# Patient Record
Sex: Female | Born: 1995 | Race: Black or African American | Hispanic: No | Marital: Single | Smoking: Former smoker
Health system: Southern US, Community
[De-identification: ages and names within clinical notes are randomized; demographics above are authoritative.]

## PROBLEM LIST (undated history)

## (undated) DIAGNOSIS — N979 Female infertility, unspecified: Secondary | ICD-10-CM

## (undated) DIAGNOSIS — F32A Depression, unspecified: Secondary | ICD-10-CM

## (undated) DIAGNOSIS — D649 Anemia, unspecified: Secondary | ICD-10-CM

## (undated) DIAGNOSIS — R0602 Shortness of breath: Secondary | ICD-10-CM

## (undated) DIAGNOSIS — R7303 Prediabetes: Secondary | ICD-10-CM

## (undated) DIAGNOSIS — R5383 Other fatigue: Secondary | ICD-10-CM

## (undated) DIAGNOSIS — F419 Anxiety disorder, unspecified: Secondary | ICD-10-CM

## (undated) DIAGNOSIS — K5900 Constipation, unspecified: Secondary | ICD-10-CM

## (undated) DIAGNOSIS — Z8249 Family history of ischemic heart disease and other diseases of the circulatory system: Secondary | ICD-10-CM

## (undated) DIAGNOSIS — F329 Major depressive disorder, single episode, unspecified: Secondary | ICD-10-CM

## (undated) DIAGNOSIS — E282 Polycystic ovarian syndrome: Secondary | ICD-10-CM

## (undated) DIAGNOSIS — Z91018 Allergy to other foods: Secondary | ICD-10-CM

## (undated) DIAGNOSIS — J45909 Unspecified asthma, uncomplicated: Secondary | ICD-10-CM

## (undated) DIAGNOSIS — E559 Vitamin D deficiency, unspecified: Secondary | ICD-10-CM

## (undated) DIAGNOSIS — M7989 Other specified soft tissue disorders: Secondary | ICD-10-CM

## (undated) DIAGNOSIS — R011 Cardiac murmur, unspecified: Secondary | ICD-10-CM

## (undated) HISTORY — DX: Morbid (severe) obesity due to excess calories: E66.01

## (undated) HISTORY — DX: Prediabetes: R73.03

## (undated) HISTORY — DX: Family history of ischemic heart disease and other diseases of the circulatory system: Z82.49

## (undated) HISTORY — DX: Cardiac murmur, unspecified: R01.1

## (undated) HISTORY — DX: Unspecified asthma, uncomplicated: J45.909

## (undated) HISTORY — DX: Anxiety disorder, unspecified: F41.9

## (undated) HISTORY — DX: Other fatigue: R53.83

## (undated) HISTORY — DX: Shortness of breath: R06.02

## (undated) HISTORY — PX: NO PAST SURGERIES: SHX2092

## (undated) HISTORY — DX: Vitamin D deficiency, unspecified: E55.9

## (undated) HISTORY — DX: Constipation, unspecified: K59.00

## (undated) HISTORY — DX: Allergy to other foods: Z91.018

## (undated) HISTORY — DX: Female infertility, unspecified: N97.9

## (undated) HISTORY — DX: Other specified soft tissue disorders: M79.89

## (undated) HISTORY — DX: Major depressive disorder, single episode, unspecified: F32.9

## (undated) HISTORY — DX: Depression, unspecified: F32.A

---
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
## 2015-01-14 ENCOUNTER — Encounter: Payer: 59 | Admitting: Obstetrics and Gynecology

## 2015-01-19 ENCOUNTER — Encounter: Payer: 59 | Admitting: Obstetrics and Gynecology

## 2015-01-24 ENCOUNTER — Ambulatory Visit (INDEPENDENT_AMBULATORY_CARE_PROVIDER_SITE_OTHER): Payer: 59 | Admitting: Obstetrics and Gynecology

## 2015-01-24 VITALS — BP 118/82 | HR 70 | Ht 62.0 in | Wt 277.1 lb

## 2015-01-24 DIAGNOSIS — Z3042 Encounter for surveillance of injectable contraceptive: Secondary | ICD-10-CM

## 2015-01-24 MED ORDER — MEDROXYPROGESTERONE ACETATE 150 MG/ML IM SUSP
150.0000 mg | Freq: Once | INTRAMUSCULAR | Status: AC
  Administered 2015-01-24: 150 mg via INTRAMUSCULAR

## 2015-01-24 NOTE — Progress Notes (Cosign Needed)
Patient ID: Katherine Huynh, female   DOB: 02-15-1996, 19 y.o.   MRN: [id number] Pt presents for Depo-Provera injection. Continues to gain weight with weight gain of 20 lbs since last visit in June. No c/o side effects except weight gain. Total of 40lbs since March. Sending message to front desk that an annual should have been scheduled this visit and to please schedule one for her next visit.

## 2015-03-03 ENCOUNTER — Encounter: Payer: 59 | Admitting: Obstetrics and Gynecology

## 2015-04-11 ENCOUNTER — Ambulatory Visit: Payer: 59

## 2015-04-12 ENCOUNTER — Encounter: Payer: Self-pay | Admitting: Obstetrics and Gynecology

## 2015-04-12 ENCOUNTER — Ambulatory Visit (INDEPENDENT_AMBULATORY_CARE_PROVIDER_SITE_OTHER): Payer: 59 | Admitting: Obstetrics and Gynecology

## 2015-04-12 VITALS — BP 134/89 | HR 89 | Ht 63.0 in | Wt 295.0 lb

## 2015-04-12 DIAGNOSIS — E669 Obesity, unspecified: Secondary | ICD-10-CM | POA: Diagnosis not present

## 2015-04-12 DIAGNOSIS — N926 Irregular menstruation, unspecified: Secondary | ICD-10-CM | POA: Diagnosis not present

## 2015-04-12 DIAGNOSIS — Z01419 Encounter for gynecological examination (general) (routine) without abnormal findings: Secondary | ICD-10-CM | POA: Diagnosis not present

## 2015-04-12 DIAGNOSIS — Z23 Encounter for immunization: Secondary | ICD-10-CM

## 2015-04-12 LAB — POCT URINE PREGNANCY: Preg Test, Ur: NEGATIVE

## 2015-04-12 MED ORDER — INFLUENZA VAC SPLIT QUAD 0.5 ML IM SUSY
0.5000 mL | PREFILLED_SYRINGE | Freq: Once | INTRAMUSCULAR | Status: AC
  Administered 2015-04-12: 0.5 mL via INTRAMUSCULAR

## 2015-04-12 MED ORDER — DROSPIREN-ETH ESTRAD-LEVOMEFOL 3-0.02-0.451 MG PO TABS: 1.0000 | ORAL_TABLET | Freq: Every day | ORAL | Status: DC

## 2015-04-12 NOTE — Progress Notes (Signed)
  Subjective:     Hoyle BarrJalisa Malkin is a 19 y.o. female and is here for a comprehensive physical exam. The patient reports no problems except weight gain that she feels like is from depo.  Social History   Social History  . Marital Status: Single    Spouse Name: N/A  . Number of Children: N/A  . Years of Education: N/A   Occupational History  . Not on file.   Social History Main Topics  . Smoking status: Never Smoker   . Smokeless tobacco: Never Used  . Alcohol Use: Yes     Comment: occas  . Drug Use: No  . Sexual Activity: Yes    Birth Control/ Protection: None   Other Topics Concern  . Not on file   Social History Narrative   Health Maintenance  Topic Date Due  . CHLAMYDIA SCREENING  06/22/2010  . HIV Screening  06/22/2010  . TETANUS/TDAP  06/22/2014  . INFLUENZA VACCINE  12/13/2014    The following portions of the patient's history were reviewed and updated as appropriate: allergies, current medications, past family history, past medical history, past social history, past surgical history and problem list.  Review of Systems Pertinent items noted in HPI and remainder of comprehensive ROS otherwise negative.   Objective:    General appearance: alert, cooperative, appears stated age and morbidly obese Neck: no adenopathy, no carotid bruit, no JVD, supple, symmetrical, trachea midline and thyroid not enlarged, symmetric, no tenderness/mass/nodules Lungs: clear to auscultation bilaterally Breasts: normal appearance, no masses or tenderness Heart: regular rate and rhythm, S1, S2 normal, no murmur, click, rub or gallop Abdomen: soft, non-tender; bowel sounds normal; no masses,  no organomegaly Pelvic: cervix normal in appearance, external genitalia normal, no adnexal masses or tenderness, no cervical motion tenderness, rectovaginal septum normal, uterus normal size, shape, and consistency and vagina normal without discharge    Assessment:    Healthy female exam. Morbid  obesity; Switching HC due to weight gain; Flu Vaccine given      Plan:  Pap not indicated, switch to [facility] OCP- to start this Sunday. Weight loss with exercise and diet modifications encourage, will consider Nutritional counseling.   See After Visit Summary for Counseling Recommendations

## 2015-04-12 NOTE — Patient Instructions (Addendum)
Place annual gynecologic exam patient instructions here.  Thank you for enrolling in MyChart. Please follow the instructions below to securely access your online medical record. MyChart allows you to send messages to your doctor, view your test results, manage appointments, and more.   How Do I Sign Up? 1. In your Internet browser, go to [employer] and enter [email or web address]. 2. Click on the Sign Up Now link in the Sign In box. You will see the New Member Sign Up page. 3. Enter your MyChart Access Code exactly as it appears below. You will not need to use this code after you've completed the sign-up process. If you do not sign up before the expiration date, you must request a new code.  MyChart Access Code: [email or web address] Expires: 05/31/2015  8:52 AM  4. Enter your Social Security Number ([email or web address]) and Date of Birth (mm/dd/yyyy) as indicated and click Submit. You will be taken to the next sign-up page. 5. Create a MyChart ID. This will be your MyChart login ID and cannot be changed, so think of one that is secure and easy to remember. 6. Create a MyChart password. You can change your password at any time. 7. Enter your Password Reset Question and Answer. This can be used at a later time if you forget your password.  8. Enter your e-mail address. You will receive e-mail notification when new information is available in MyChart. 9. Click Sign Up. You can now view your medical record.   Additional Information Remember, MyChart is NOT to be used for urgent needs. For medical emergencies, dial 911.   Calorie Counting for Weight Loss Calories are energy you get from the things you eat and drink. Your body uses this energy to keep you going throughout the day. The number of calories you eat affects your weight. When you eat more calories than your body needs, your body stores the extra calories as fat. When you eat fewer calories than your body needs, your body burns fat to  get the energy it needs. Calorie counting means keeping track of how many calories you eat and drink each day. If you make sure to eat fewer calories than your body needs, you should lose weight. In order for calorie counting to work, you will need to eat the number of calories that are right for you in a day to lose a healthy amount of weight per week. A healthy amount of weight to lose per week is usually 1-2 lb (0.5-0.9 kg). A dietitian can determine how many calories you need in a day and give you suggestions on how to reach your calorie goal.  WHAT IS MY MY PLAN? My goal is to have __________ calories per day.  If I have this many calories per day, I should lose around __________ pounds per week. WHAT DO I NEED TO KNOW ABOUT CALORIE COUNTING? In order to meet your daily calorie goal, you will need to:  Find out how many calories are in each food you would like to eat. Try to do this before you eat.  Decide how much of the food you can eat.  Write down what you ate and how many calories it had. Doing this is called keeping a food log. WHERE DO I FIND CALORIE INFORMATION? The number of calories in a food can be found on a Nutrition Facts label. Note that all the information on a label is based on a specific serving of the food. If a food does not have  a Nutrition Facts label, try to look up the calories online or ask your dietitian for help. HOW DO I DECIDE HOW MUCH TO EAT? To decide how much of the food you can eat, you will need to consider both the number of calories in one serving and the size of one serving. This information can be found on the Nutrition Facts label. If a food does not have a Nutrition Facts label, look up the information online or ask your dietitian for help. Remember that calories are listed per serving. If you choose to have more than one serving of a food, you will have to multiply the calories per serving by the amount of servings you plan to eat. For example, the label  on a package of bread might say that a serving size is 1 slice and that there are 90 calories in a serving. If you eat 1 slice, you will have eaten 90 calories. If you eat 2 slices, you will have eaten 180 calories. HOW DO I KEEP A FOOD LOG? After each meal, record the following information in your food log:  What you ate.  How much of it you ate.  How many calories it had.  Then, add up your calories. Keep your food log near you, such as in a small notebook in your pocket. Another option is to use a mobile app or website. Some programs will calculate calories for you and show you how many calories you have left each time you add an item to the log. WHAT ARE SOME CALORIE COUNTING TIPS?  Use your calories on foods and drinks that will fill you up and not leave you hungry. Some examples of this include foods like nuts and nut butters, vegetables, lean proteins, and high-fiber foods (more than 5 g fiber per serving).  Eat nutritious foods and avoid empty calories. Empty calories are calories you get from foods or beverages that do not have many nutrients, such as candy and soda. It is better to have a nutritious high-calorie food (such as an avocado) than a food with few nutrients (such as a bag of chips).  Know how many calories are in the foods you eat most often. This way, you do not have to look up how many calories they have each time you eat them.  Look out for foods that may seem like low-calorie foods but are really high-calorie foods, such as baked goods, soda, and fat-free candy.  Pay attention to calories in drinks. Drinks such as sodas, specialty coffee drinks, alcohol, and juices have a lot of calories yet do not fill you up. Choose low-calorie drinks like water and diet drinks.  Focus your calorie counting efforts on higher calorie items. Logging the calories in a garden salad that contains only vegetables is less important than calculating the calories in a milk shake.  Find a  way of tracking calories that works for you. Get creative. Most people who are successful find ways to keep track of how much they eat in a day, even if they do not count every calorie. WHAT ARE SOME PORTION CONTROL TIPS?  Know how many calories are in a serving. This will help you know how many servings of a certain food you can have.  Use a measuring cup to measure serving sizes. This is helpful when you start out. With time, you will be able to estimate serving sizes for some foods.  Take some time to put servings of different foods on your  favorite plates, bowls, and cups so you know what a serving looks like.  Try not to eat straight from a bag or box. Doing this can lead to overeating. Put the amount you would like to eat in a cup or on a plate to make sure you are eating the right portion.  Use smaller plates, glasses, and bowls to prevent overeating. This is a quick and easy way to practice portion control. If your plate is smaller, less food can fit on it.  Try not to multitask while eating, such as watching TV or using your computer. If it is time to eat, sit down at a table and enjoy your food. Doing this will help you to start recognizing when you are full. It will also make you more aware of what and how much you are eating. HOW CAN I CALORIE COUNT WHEN EATING OUT?  Ask for smaller portion sizes or child-sized portions.  Consider sharing an entree and sides instead of getting your own entree.  If you get your own entree, eat only half. Ask for a box at the beginning of your meal and put the rest of your entree in it so you are not tempted to eat it.  Look for the calories on the menu. If calories are listed, choose the lower calorie options.  Choose dishes that include vegetables, fruits, whole grains, low-fat dairy products, and lean protein. Focusing on smart food choices from each of the 5 food groups can help you stay on track at restaurants.  Choose items that are boiled,  broiled, grilled, or steamed.  Choose water, milk, unsweetened iced tea, or other drinks without added sugars. If you want an alcoholic beverage, choose a lower calorie option. For example, a regular margarita can have up to 700 calories and a glass of wine has around 150.  Stay away from items that are buttered, battered, fried, or served with cream sauce. Items labeled "crispy" are usually fried, unless stated otherwise.  Ask for dressings, sauces, and syrups on the side. These are usually very high in calories, so do not eat much of them.  Watch out for salads. Many people think salads are a healthy option, but this is often not the case. Many salads come with bacon, fried chicken, lots of cheese, fried chips, and dressing. All of these items have a lot of calories. If you want a salad, choose a garden salad and ask for grilled meats or steak. Ask for the dressing on the side, or ask for olive oil and vinegar or lemon to use as dressing.  Estimate how many servings of a food you are given. For example, a serving of cooked rice is  cup or about the size of half a tennis ball or one cupcake wrapper. Knowing serving sizes will help you be aware of how much food you are eating at restaurants. The list below tells you how big or small some common portion sizes are based on everyday objects.  1 oz--4 stacked dice.  3 oz--1 deck of cards.  1 tsp--1 dice.  1 Tbsp-- a Ping-Pong ball.  2 Tbsp--1 Ping-Pong ball.   cup--1 tennis ball or 1 cupcake wrapper.  1 cup--1 baseball.   This information is not intended to replace advice given to you by your health care provider. Make sure you discuss any questions you have with your health care provider.   Document Released: 04/30/2005 Document Revised: 05/21/2014 Document Reviewed: 03/05/2013 Elsevier Interactive Patient Education [employer]

## 2015-04-12 NOTE — Addendum Note (Signed)
Addended by: Purcell NailsSHAMBLEY, Jaydee Conran N on: 04/12/2015 03:16 PM   Modules accepted: Orders

## 2015-04-16 LAB — GC/CHLAMYDIA PROBE AMP
Chlamydia trachomatis, NAA: NEGATIVE
Neisseria gonorrhoeae by PCR: NEGATIVE

## 2015-05-28 LAB — HEMOGLOBIN A1C
Est. average glucose Bld gHb Est-mCnc: 111 mg/dL
Hgb A1c MFr Bld: 5.5 % (ref 4.8–5.6)

## 2015-05-28 LAB — COMPREHENSIVE METABOLIC PANEL
ALT: 16 IU/L (ref 0–32)
AST: 14 IU/L (ref 0–40)
Albumin/Globulin Ratio: 1.3 (ref 1.1–2.5)
Albumin: 4 g/dL (ref 3.5–5.5)
Alkaline Phosphatase: 76 IU/L (ref 39–117)
BUN/Creatinine Ratio: 11 (ref 8–20)
BUN: 9 mg/dL (ref 6–20)
Bilirubin Total: <0.2 mg/dL (ref 0.0–1.2)
CO2: 22 mmol/L (ref 18–29)
Calcium: 9.4 mg/dL (ref 8.7–10.2)
Chloride: 102 mmol/L (ref 96–106)
Creatinine, Ser: 0.81 mg/dL (ref 0.57–1.00)
GFR calc Af Amer: 122 mL/min/{1.73_m2} (ref >59)
GFR calc non Af Amer: 106 mL/min/{1.73_m2} (ref >59)
Globulin, Total: 3 g/dL (ref 1.5–4.5)
Glucose: 98 mg/dL (ref 65–99)
Potassium: 4.6 mmol/L (ref 3.5–5.2)
Sodium: 138 mmol/L (ref 134–144)
Total Protein: 7 g/dL (ref 6.0–8.5)

## 2015-05-28 LAB — LIPASE: Lipase: 28 U/L (ref 0–59)

## 2015-06-02 ENCOUNTER — Ambulatory Visit (INDEPENDENT_AMBULATORY_CARE_PROVIDER_SITE_OTHER): Payer: 59 | Admitting: Obstetrics and Gynecology

## 2015-06-02 ENCOUNTER — Encounter: Payer: Self-pay | Admitting: Obstetrics and Gynecology

## 2015-06-02 MED ORDER — CYANOCOBALAMIN 1000 MCG/ML IJ SOLN: 1000.0000 ug | Freq: Once | INTRAMUSCULAR | Status: DC

## 2015-06-02 MED ORDER — PHENTERMINE HCL 37.5 MG PO TABS: 37.5000 mg | ORAL_TABLET | Freq: Every day | ORAL | Status: DC

## 2015-06-02 NOTE — Progress Notes (Signed)
Subjective:  Katherine Huynh is a 20 y.o. No obstetric history on file. at Unknown being seen today for weight loss management- initial visit.  Patient reports General ROS: negative and reports previous weight loss attempts to be unsuccessful since starting Depo (is off of it now)  Onset was gradual over several months.  Onset followed:  starting medication, The following portions of the patient's history were reviewed and updated as appropriate: allergies, current medications, past family history, past medical history, past social history, past surgical history and problem list.   Objective:   Filed Vitals:   06/02/15 1106  BP: 130/76  Pulse: 110  Height:  (1.6 m)  Weight: 295 lb 6.4 oz (133.993 kg)    General:  Alert, oriented and cooperative. Patient is in no acute distress.  :   :   :   :   :   :   PE: Well groomed female in no current distress,   Mental Status: Normal mood and affect. Normal behavior. Normal judgment and thought content.   Current BMI: Body mass index is 52.34 kg/(m^2).   Assessment and Plan:  Obesity  There are no diagnoses linked to this encounter.  Plan: low carb, High protein diet RX for adipex 37.5 mg daily and B12 .ml monthly, to start now with first injection given at today's visit. Reviewed side-effects common to both medications and expected outcomes. Increase daily water intake to at least 8 bottle a day, every day.  Goal is to reduse weight by 10% by end of three months, and will re-evaluate then.  RTC in 4 weeks for Nurse visit to check weight & BP, and get next B12 injections.    Please refer to After Visit Summary for other counseling recommendations.    Melody [place], CNM   Melody [employer], CNM      Consider the Low Glycemic Index Diet and 6 smaller meals daily .  This boosts your metabolism and regulates your sugars:   Use the protein bar by Atkins because they have lots of fiber in them  Find the low  carb flatbreads, tortillas and pita breads for sandwiches:  Joseph's makes a pita bread and a flat bread , available at [facility] and [employer]; Toufayah makes a low carb flatbread available at [employer] and HT that is 9 net carbs and 100 cal [place] makes a low carb whole wheat tortilla available at [employer] most grocery stores with 6 net carbs and 210 cal  [place] yogurt can still have a lot of carbs .  Dannon Light N fit has 80 cal and 8 carbs

## 2015-06-09 ENCOUNTER — Telehealth: Payer: Self-pay | Admitting: Obstetrics and Gynecology

## 2015-06-09 NOTE — Telephone Encounter (Signed)
Patient called stating she is having side affects from phentermine. She stated that she is trembling. Please Advise

## 2015-06-10 NOTE — Telephone Encounter (Signed)
Called pt she states she is trembling advised pt to take 1/2 of tablet and see how she does with that, she voiced understanding

## 2015-06-14 ENCOUNTER — Ambulatory Visit (INDEPENDENT_AMBULATORY_CARE_PROVIDER_SITE_OTHER): Payer: 59 | Admitting: Obstetrics and Gynecology

## 2015-06-14 ENCOUNTER — Encounter: Payer: Self-pay | Admitting: Obstetrics and Gynecology

## 2015-06-14 VITALS — BP 122/78 | HR 99 | Ht 63.0 in | Wt 290.8 lb

## 2015-06-14 DIAGNOSIS — N921 Excessive and frequent menstruation with irregular cycle: Secondary | ICD-10-CM | POA: Diagnosis not present

## 2015-06-14 NOTE — Progress Notes (Signed)
Subjective:     Patient ID: Katherine Huynh, female   DOB: Aug 01, 1995, 20 y.o.   MRN: [id number]  HPI Reports onset of menses at end of last pack of pills, it has been heavy x 3 days with changing pads every 1-2 hours, better this am. Cramping in lower abdomen and aching in to legs. Denies any BTB.  Review of Systems See above.    Objective:   Physical Exam A&O x4 Well groomed morbidly obese female in no distress Thyroid not enlarged Pelvic not indicated.    Assessment:     Menorrhagia on OCPs, new switch x 1 month     Plan:     Continue Beyaz x 2 more months, will consider change if bleeding persist. OK to use Aleeve prn for cramps. OK to continue weight loss meds. RTC at scheduled appointment.   Shariq [place], CNM

## 2015-06-30 ENCOUNTER — Ambulatory Visit (INDEPENDENT_AMBULATORY_CARE_PROVIDER_SITE_OTHER): Payer: 59 | Admitting: Obstetrics and Gynecology

## 2015-06-30 VITALS — BP 113/85 | HR 92 | Wt 286.6 lb

## 2015-06-30 DIAGNOSIS — E669 Obesity, unspecified: Secondary | ICD-10-CM | POA: Diagnosis not present

## 2015-06-30 MED ORDER — CYANOCOBALAMIN 1000 MCG/ML IJ SOLN
1000.0000 ug | Freq: Once | INTRAMUSCULAR | Status: AC
  Administered 2015-06-30: 1000 ug via INTRAMUSCULAR

## 2015-06-30 NOTE — Progress Notes (Cosign Needed)
Patient ID: Katherine Huynh, female   DOB: 12/12/1995, 20 y.o.   MRN: [id number] Pt presents for weight, B/P, B-12 injection. No side effects of medication-Phentermine, or B-12.  Weight loss of 3 lbs. Encouraged eating healthy and exercise.

## 2015-07-28 ENCOUNTER — Ambulatory Visit (INDEPENDENT_AMBULATORY_CARE_PROVIDER_SITE_OTHER): Payer: 59 | Admitting: Obstetrics and Gynecology

## 2015-07-28 VITALS — BP 117/81 | HR 90 | Wt 290.1 lb

## 2015-07-28 DIAGNOSIS — E669 Obesity, unspecified: Secondary | ICD-10-CM | POA: Diagnosis not present

## 2015-07-28 MED ORDER — CYANOCOBALAMIN 1000 MCG/ML IJ SOLN
1000.0000 ug | Freq: Once | INTRAMUSCULAR | Status: AC
  Administered 2015-07-28: 1000 ug via INTRAMUSCULAR

## 2015-07-28 NOTE — Progress Notes (Signed)
Patient ID: Katherine Huynh, female   DOB: 11/03/1995, 20 y.o.   MRN: [id number] Pt presents for weight, B/P, B-12 injection. No side effects of medication-Phentermine, or B-12.  Weight gain of 4 lbs. Encouraged eating healthy and exercise. Pt affect seems depressed. Talked with pt but nothing really said except that she wasn't eating. This could be her problem with weight loss. The pills do decrease her appetite. I advised pt to eat. She needs to change her lifestyle. To eat healthy meals and healthy snacks and to watch portion sizes. Also include exercise daily.

## 2015-08-25 ENCOUNTER — Ambulatory Visit: Payer: 59

## 2015-09-15 ENCOUNTER — Ambulatory Visit (INDEPENDENT_AMBULATORY_CARE_PROVIDER_SITE_OTHER): Payer: 59 | Admitting: Family Medicine

## 2015-09-15 ENCOUNTER — Encounter: Payer: Self-pay | Admitting: Family Medicine

## 2015-09-15 VITALS — BP 124/82 | HR 99 | Temp 98.5°F | Resp 16 | Ht 63.0 in | Wt 290.0 lb

## 2015-09-15 DIAGNOSIS — B351 Tinea unguium: Secondary | ICD-10-CM

## 2015-09-15 DIAGNOSIS — Z114 Encounter for screening for human immunodeficiency virus [HIV]: Secondary | ICD-10-CM | POA: Diagnosis not present

## 2015-09-15 DIAGNOSIS — Z8249 Family history of ischemic heart disease and other diseases of the circulatory system: Secondary | ICD-10-CM

## 2015-09-15 DIAGNOSIS — Z0000 Encounter for general adult medical examination without abnormal findings: Secondary | ICD-10-CM | POA: Diagnosis not present

## 2015-09-15 DIAGNOSIS — Z882 Allergy status to sulfonamides status: Secondary | ICD-10-CM | POA: Diagnosis not present

## 2015-09-15 DIAGNOSIS — B353 Tinea pedis: Secondary | ICD-10-CM

## 2015-09-15 HISTORY — DX: Family history of ischemic heart disease and other diseases of the circulatory system: Z82.49

## 2015-09-15 MED ORDER — CICLOPIROX 8 % EX SOLN: Freq: Every day | CUTANEOUS | Status: DC

## 2015-09-15 MED ORDER — TERBINAFINE HCL 1 % EX CREA: 1.0000 "application " | TOPICAL_CREAM | Freq: Every day | CUTANEOUS | Status: DC

## 2015-09-15 MED ORDER — LORATADINE 10 MG PO TABS: 10.0000 mg | ORAL_TABLET | Freq: Every day | ORAL | Status: DC | PRN

## 2015-09-15 NOTE — Progress Notes (Signed)
BP 124/82 mmHg  Pulse 99  Temp(Src) 98.5 F (36.9 C) (Oral)  Resp 16  Ht 5\' 3"  (1.6 m)  Wt 290 lb (131.543 kg)  BMI 51.38 kg/m2  SpO2 98%  LMP 07/16/2015   Subjective:    Patient ID: Katherine Huynh, female    DOB: 01/20/1996, 20 y.o.   MRN: [id number]  HPI: Katherine BarrJalisa Venuto is a 20 y.o. female  Chief Complaint  Patient presents with  . Nail Problem    onset years has tried otc med with no relief   Patient is here as a new patient to me; has thickening of the toenails on the left foot only Right foot toenails are fine Wants to do something about the nail thickening Took zinc; no help  Depression screen PHQ 2/9 09/15/2015  Decreased Interest 0  Down, Depressed, Hopeless 0  PHQ - 2 Score 0   Relevant past medical, surgical, family and social history reviewed Past Medical History  Diagnosis Date  . Depression   . Fam hx-ischem heart disease 09/15/2015   Family History  Problem Relation Age of Onset  . Cancer Maternal Grandmother     breast  . Heart disease Maternal Grandfather   . Heart disease Paternal Grandfather   MD note: father takes cholesterol medicine  Social History  Substance Use Topics  . Smoking status: Never Smoker   . Smokeless tobacco: Never Used  . Alcohol Use: Yes     Comment: occas   Interim medical history since last visit reviewed. Allergies and medications reviewed  Review of Systems Per HPI unless specifically indicated above     Objective:    BP 124/82 mmHg  Pulse 99  Temp(Src) 98.5 F (36.9 C) (Oral)  Resp 16  Ht 5\' 3"  (1.6 m)  Wt 290 lb (131.543 kg)  BMI 51.38 kg/m2  SpO2 98%  LMP 07/16/2015  Wt Readings from Last 3 Encounters:  09/15/15 290 lb (131.543 kg)  07/28/15 290 lb 2 oz (131.6 kg)  06/30/15 286 lb 9 oz (129.984 kg)    Physical Exam  Constitutional: She appears well-developed and well-nourished.  Morbidly obese, no distress  HENT:  Mouth/Throat: Mucous membranes are normal.  Eyes: EOM are normal. No scleral  icterus.  Cardiovascular: Normal rate and regular rhythm.   Pulmonary/Chest: Effort normal and breath sounds normal.  Skin: Rash (scale and mild desquamation of skin left foot along sole consistent with athlete's foot infection) noted.  Toenails on the left foot thickened with yellowish discoloration; left foot toenails are normal  Psychiatric: She has a normal mood and affect. Her behavior is normal.      Assessment & Plan:   Problem List Items Addressed This Visit      Musculoskeletal and Integument   Athlete's foot, left    Topical; treatment of both nails and skin needed      Relevant Medications   ciclopirox (PENLAC) 8 % solution   terbinafine (LAMISIL AT) 1 % cream   Onychomycosis - Primary    Start penlac; treat the athlete's foot on the left as well; use separate clippers for toenails left vs right foot      Relevant Medications   ciclopirox (PENLAC) 8 % solution   terbinafine (LAMISIL AT) 1 % cream     Other   Fam hx-ischem heart disease   Personal history of sulfonamide allergy    Other Visit Diagnoses    Preventative health care        used for screening lab  Relevant Orders    Lipid Panel w/o Chol/HDL Ratio    Encounter for screening for HIV        one-time HIV testing recommended    Relevant Orders    HIV antibody       Follow up plan: No Follow-up on file.  An after-visit summary was printed and given to the patient at check-out.  Please see the patient instructions which may contain other information and recommendations beyond what is mentioned above in the assessment and plan.  Meds ordered this encounter  Medications  . loratadine (CLARITIN) 10 MG tablet    Sig: Take 1 tablet (10 mg total) by mouth daily as needed for allergies or itching.    Dispense:  30 tablet    Refill:  11  . ciclopirox (PENLAC) 8 % solution    Sig: Apply topically at bedtime. Apply over nail and surrounding skin. Apply daily over previous coat. After seven (7) days, may  remove with alcohol and continue cycle.    Dispense:  6.6 mL    Refill:  0  . terbinafine (LAMISIL AT) 1 % cream    Sig: Apply 1 application topically daily. To the left foot and in between the toes    Dispense:  30 g    Refill:  0    Orders Placed This Encounter  Procedures  . HIV antibody  . Lipid Panel w/o Chol/HDL Ratio

## 2015-09-15 NOTE — Patient Instructions (Signed)
Use the penlac on the nails Use the lamisil on the left foot Have labs today If you have not heard anything from my staff in a week about any orders/referrals/studies from today, please contact us here to follow-up [phone]

## 2015-10-10 DIAGNOSIS — B353 Tinea pedis: Secondary | ICD-10-CM | POA: Insufficient documentation

## 2015-10-10 NOTE — Assessment & Plan Note (Signed)
Topical; treatment of both nails and skin needed

## 2015-10-10 NOTE — Assessment & Plan Note (Signed)
Start penlac; treat the athlete's foot on the left as well; use separate clippers for toenails left vs right foot

## 2015-11-29 ENCOUNTER — Encounter: Payer: Self-pay | Admitting: Obstetrics and Gynecology

## 2015-11-29 ENCOUNTER — Ambulatory Visit (INDEPENDENT_AMBULATORY_CARE_PROVIDER_SITE_OTHER): Payer: 59 | Admitting: Obstetrics and Gynecology

## 2015-11-29 VITALS — BP 133/91 | HR 88 | Ht 63.0 in | Wt 294.9 lb

## 2015-11-29 DIAGNOSIS — Z3169 Encounter for other general counseling and advice on procreation: Secondary | ICD-10-CM | POA: Diagnosis not present

## 2015-11-29 DIAGNOSIS — R21 Rash and other nonspecific skin eruption: Secondary | ICD-10-CM | POA: Diagnosis not present

## 2015-11-29 MED ORDER — PHENTERMINE HCL 37.5 MG PO TABS: 37.5000 mg | ORAL_TABLET | Freq: Every day | ORAL | Status: DC

## 2015-11-29 MED ORDER — CYANOCOBALAMIN 1000 MCG/ML IJ SOLN: 1000.0000 ug | Freq: Once | INTRAMUSCULAR | Status: DC

## 2015-11-29 NOTE — Progress Notes (Signed)
Subjective:     Patient ID: Katherine Huynh, female   DOB: 07/09/1995, 20 y.o.   MRN: [id number]  HPI Patient desires pregnancy in spring of next year, has questions related to vitamins, anxiety and weight in leu of getting pregnant. Had a SAB at age 20, was told it was due to stress, thinks she was [redacted] weeks along. FOB family history of brother born deaf, otherwise negative. Reports regular menses at this time.  Review of Systems Rash after taking any kinds of multivitamins, and with allergy to cinnamon and sulfa meds.     Objective:   Physical Exam A&O x4  well groomed obese female in no distress, boyfriend here and very supportive Blood pressure 133/91, pulse 88, height 5\' 3"  (1.6 m), weight 294 lb 14.4 oz (133.766 kg), last menstrual period 10/27/2015. PE deferred     Assessment:     Pre-conception counseling Morbid obesity Elevated BP Rash- allergic in nature-unknown etiology     Plan:     >50% of visit spent in counseling. Discussed stress management and recommended counseling- declined at this time, will consider in the future. Discussed allergies to vitamins and recommended allergy testing - referral placed. Discussed elevated BMI and it's effects on achieving pregnancy and increasing risks in pregnancy. Strongly encouraged to lose weight and maintain weight loss before trying. Desires to restart weight loss meds-B12 injection given today-will return in 4 weeks for nurse visit-wt/bp/B12.  Katherine Huynh, CNM

## 2015-12-27 ENCOUNTER — Ambulatory Visit: Payer: 59

## 2016-02-17 ENCOUNTER — Ambulatory Visit (INDEPENDENT_AMBULATORY_CARE_PROVIDER_SITE_OTHER): Payer: 59 | Admitting: Family Medicine

## 2016-02-17 ENCOUNTER — Encounter: Payer: Self-pay | Admitting: Family Medicine

## 2016-02-17 VITALS — BP 130/80 | HR 94 | Temp 99.1°F | Resp 16 | Ht 63.0 in | Wt 293.2 lb

## 2016-02-17 DIAGNOSIS — R5383 Other fatigue: Secondary | ICD-10-CM

## 2016-02-17 DIAGNOSIS — Z23 Encounter for immunization: Secondary | ICD-10-CM

## 2016-02-17 DIAGNOSIS — R454 Irritability and anger: Secondary | ICD-10-CM

## 2016-02-17 DIAGNOSIS — N920 Excessive and frequent menstruation with regular cycle: Secondary | ICD-10-CM | POA: Insufficient documentation

## 2016-02-17 DIAGNOSIS — N921 Excessive and frequent menstruation with irregular cycle: Secondary | ICD-10-CM | POA: Diagnosis not present

## 2016-02-17 DIAGNOSIS — R635 Abnormal weight gain: Secondary | ICD-10-CM | POA: Diagnosis not present

## 2016-02-17 DIAGNOSIS — F5081 Binge eating disorder: Secondary | ICD-10-CM

## 2016-02-17 DIAGNOSIS — F339 Major depressive disorder, recurrent, unspecified: Secondary | ICD-10-CM | POA: Insufficient documentation

## 2016-02-17 DIAGNOSIS — F331 Major depressive disorder, recurrent, moderate: Secondary | ICD-10-CM

## 2016-02-17 DIAGNOSIS — F50819 Binge eating disorder, unspecified: Secondary | ICD-10-CM

## 2016-02-17 HISTORY — DX: Morbid (severe) obesity due to excess calories: E66.01

## 2016-02-17 LAB — T4, FREE: Free T4: 1.1 ng/dL (ref 0.8–1.4)

## 2016-02-17 LAB — LIPID PANEL
Cholesterol: 146 mg/dL (ref 125–170)
HDL: 56 mg/dL (ref >=46)
LDL Cholesterol: 71 mg/dL (ref <110)
Total CHOL/HDL Ratio: 2.6 Ratio (ref <=5.0)
Triglycerides: 97 mg/dL (ref <150)
VLDL: 19 mg/dL (ref <30)

## 2016-02-17 LAB — CBC WITH DIFFERENTIAL/PLATELET
Basophils Absolute: 0 cells/uL (ref 0–200)
Basophils Relative: 0 %
Eosinophils Absolute: 252 cells/uL (ref 15–500)
Eosinophils Relative: 3 %
HCT: 38.3 % (ref 35.0–45.0)
Hemoglobin: 12.7 g/dL (ref 11.7–15.5)
Lymphocytes Relative: 28 %
Lymphs Abs: 2352 cells/uL (ref 850–3900)
MCH: 27.3 pg (ref 27.0–33.0)
MCHC: 33.2 g/dL (ref 32.0–36.0)
MCV: 82.4 fL (ref 80.0–100.0)
MPV: 9.3 fL (ref 7.5–12.5)
Monocytes Absolute: 504 cells/uL (ref 200–950)
Monocytes Relative: 6 %
Neutro Abs: 5292 cells/uL (ref 1500–7800)
Neutrophils Relative %: 63 %
Platelets: 320 10*3/uL (ref 140–400)
RBC: 4.65 MIL/uL (ref 3.80–5.10)
RDW: 14.4 % (ref 11.0–15.0)
WBC: 8.4 10*3/uL (ref 3.8–10.8)

## 2016-02-17 LAB — COMPLETE METABOLIC PANEL WITH GFR
ALT: 9 U/L (ref 6–29)
AST: 11 U/L (ref 10–30)
Albumin: 3.9 g/dL (ref 3.6–5.1)
Alkaline Phosphatase: 70 U/L (ref 33–115)
BUN: 11 mg/dL (ref 7–25)
CO2: 25 mmol/L (ref 20–31)
Calcium: 9.5 mg/dL (ref 8.6–10.2)
Chloride: 104 mmol/L (ref 98–110)
Creat: 0.91 mg/dL (ref 0.50–1.10)
GFR, Est African American: >89 mL/min (ref >=60)
GFR, Est Non African American: >89 mL/min (ref >=60)
Glucose, Bld: 84 mg/dL (ref 65–99)
Potassium: 4.4 mmol/L (ref 3.5–5.3)
Sodium: 138 mmol/L (ref 135–146)
Total Bilirubin: 0.2 mg/dL (ref 0.2–1.2)
Total Protein: 7.6 g/dL (ref 6.1–8.1)

## 2016-02-17 LAB — VITAMIN B12: Vitamin B-12: 472 pg/mL (ref 200–1100)

## 2016-02-17 LAB — FERRITIN: Ferritin: 43 ng/mL (ref 10–154)

## 2016-02-17 LAB — TSH: TSH: 3.53 mIU/L

## 2016-02-17 MED ORDER — FLUOXETINE HCL 20 MG PO TABS: 20.0000 mg | ORAL_TABLET | Freq: Every day | ORAL | 0 refills | Status: DC

## 2016-02-17 NOTE — Assessment & Plan Note (Signed)
Refer to psych 

## 2016-02-17 NOTE — Patient Instructions (Signed)
We'll get labs today We'll refer you to the psychiatrist Continue to work with Ival BibleGary Bailey If you have not heard anything from my staff in a week about any orders/referrals/studies from today, please contact us here to follow-up [phone] If you have any thoughts of self-harm or hurting others, please call the crisis line or 911 Start the antidepressant

## 2016-02-17 NOTE — Assessment & Plan Note (Addendum)
Refer to psychiatry; continue to work with Ival BibleGary Bailey; patient has crisis number; agrees to call for help if SI/HI; start fluoxetine; check labs; symptoms don't sound to be typical bipolar disorder, but may reflect poor sleep pattern, ADHD, intermittent explosive disorder, personality disorder, etc; I want patient to see psychiatrist soon, and continue to work with therapist obviously; if any worsening of symptoms on the SSRI, patient to call, close f/u

## 2016-02-17 NOTE — Progress Notes (Signed)
BP 130/80 (BP Location: Left Arm, Patient Position: Sitting, Cuff Size: Normal)   Pulse 94   Temp 99.1 F (37.3 C) (Oral)   Resp 16   Ht 5\' 3"  (1.6 m)   Wt 293 lb 4 oz (133 kg)   SpO2 97%   BMI 51.95 kg/m    Subjective:    Patient ID: Katherine Huynh, female    DOB: 11-Jun-1995, 20 y.o.   MRN: [id number]  HPI: Katherine Huynh is a 20 y.o. female  Chief Complaint  Patient presents with  . Medication Refill    Depression    Patient is here to discuss going back on medicine She wants to start back on depression medicine No hx of hospitalizations for depression She tried to hurt herself when she was 20 years old (hanging), started seeing therapist but was no hospitalized That's when she started medicine; they started fluoxetine She just stopped taking it when she was 20 years old; she does feel like it helped from 50 to 66 I asked for more info about the anger episodes; sometimes she gets so irritated by little things and she loses it; she will yell and throw things; she does not hurt people; she will break things and feels out of control Not much trouble with impusive action, but can't control Does binge eat "all the time", started about two years; gained 100 pounds since then; that does not run in the family; 15 days a month binge eats No hx of abuse in the past Feels anxious all the time Some days she does not sleep and gets really angry, she will stay up for days She just started seeing Ival Bible, therapist, yeasterday She does not know if she has any family hx of depression; no real knowledge of family hx of mental illness She hears "noises"; she gets really paranoid when she is at home, hears noises and thinks she hears someone cough downstairs; going on since high school She denies visual hallucinations No thoughts that any one is out to get her She does not think she has any special powers, like reading minds or causing events to happen Periods are not really regular;  sometimes a few days or weeks late; has some hormone changes No recent thoughts of hurting herself or anyone She used to take fluoxetine, 20 mg at first, 40 mg most recently Lives with mother, father, fiance Very low energy; constipation; no hair loss; definitely dry skin; strong fam hx of thyroid disease  Depression screen [facility] 2/9 02/17/2016 09/15/2015  Decreased Interest 3 0  Down, Depressed, Hopeless 3 0  PHQ - 2 Score 6 0  Altered sleeping 3 -  Tired, decreased energy 3 -  Change in appetite 3 -  Feeling bad or failure about yourself  3 -  Trouble concentrating 3 -  Moving slowly or fidgety/restless 0 -  Suicidal thoughts 0 -  PHQ-9 Score 21 -  Difficult doing work/chores Not difficult at all -   Relevant past medical, surgical, family and social history reviewed Past Medical History:  Diagnosis Date  . Depression   . Fam hx-ischem heart disease 09/15/2015   Past Surgical History:  Procedure Laterality Date  . NO PAST SURGERIES     Family History  Problem Relation Age of Onset  . Cancer Maternal Grandmother     breast  . Heart disease Maternal Grandfather   . Heart disease Paternal Grandfather   mother and maternal grandmother both with thyroid disease  Social History  Substance Use Topics  . Smoking status: Never Smoker  . Smokeless tobacco: Never Used  . Alcohol use Yes     Comment: occas   Interim medical history since last visit reviewed. Allergies and medications reviewed  Review of Systems Per HPI unless specifically indicated above     Objective:    BP 130/80 (BP Location: Left Arm, Patient Position: Sitting, Cuff Size: Normal)   Pulse 94   Temp 99.1 F (37.3 C) (Oral)   Resp 16   Ht 5\' 3"  (1.6 m)   Wt 293 lb 4 oz (133 kg)   SpO2 97%   BMI 51.95 kg/m   Wt Readings from Last 3 Encounters:  02/17/16 293 lb 4 oz (133 kg)  11/29/15 294 lb 14.4 oz (133.8 kg)  09/15/15 290 lb (131.5 kg)    Physical Exam  Constitutional: She appears well-developed  and well-nourished. No distress.  Eyes: EOM are normal. No scleral icterus.  Neck: No thyromegaly present.  Cardiovascular: Normal rate.   Pulmonary/Chest: Effort normal.  Abdominal: She exhibits no distension.  Skin: No pallor.  Psychiatric: She has a normal mood and affect. Her behavior is normal. Judgment and thought content normal. Her mood appears not anxious. Her affect is not angry, not blunt, not labile and not inappropriate. Her speech is not rapid and/or pressured. She is not agitated, not hyperactive, not slowed and not actively hallucinating. Thought content is not paranoid and not delusional. Cognition and memory are not impaired. She does not express impulsivity or inappropriate judgment. She does not exhibit a depressed mood. She expresses no homicidal and no suicidal ideation. She is attentive.      Assessment & Plan:   Problem List Items Addressed This Visit      Other   Morbid obesity (HCC)    Sounds to be related to her binge eating, she may be a candidate for Vyvanse, but will have her psych first      Relevant Orders   Lipid panel (Completed)   COMPLETE METABOLIC PANEL WITH GFR (Completed)   Heavy period    Check labs      Relevant Orders   CBC with Differential/Platelet (Completed)   Ferritin (Completed)   Fatigue    Check labs      Relevant Orders   Vitamin B12 (Completed)   VITAMIN D 25 Hydroxy (Vit-D Deficiency, Fractures)   Difficulty controlling anger    Refer to psych      Relevant Orders   Ambulatory referral to Psychiatry   Depression, major, recurrent (HCC) - Primary    Refer to psychiatry; continue to work with Ival Bible; patient has crisis number; agrees to call for help if SI/HI; start fluoxetine; check labs; symptoms don't sound to be typical bipolar disorder, but may reflect poor sleep pattern, ADHD, intermittent explosive disorder, personality disorder, etc; I want patient to see psychiatrist soon, and continue to work with therapist  obviously; if any worsening of symptoms on the SSRI, patient to call, close f/u      Relevant Medications   FLUoxetine (PROZAC) 20 MG tablet   Other Relevant Orders   Ambulatory referral to Psychiatry   Binge eating disorder    Refer to psych      Relevant Orders   Ambulatory referral to Psychiatry   Abnormal weight gain   Relevant Orders   TSH (Completed)   T4, free (Completed)    Other Visit Diagnoses    Needs flu shot  Relevant Orders   Flu Vaccine QUAD 36+ mos PF IM (Fluarix & Fluzone Quad PF) (Completed)      Follow up plan: Return in about 2 weeks (around 03/02/2016).  An after-visit summary was printed and given to the patient at check-out.  Please see the patient instructions which may contain other information and recommendations beyond what is mentioned above in the assessment and plan.  Meds ordered this encounter  Medications  . FLUoxetine (PROZAC) 20 MG tablet    Sig: Take 1 tablet (20 mg total) by mouth daily. For two weeks, then two tablets daily    Dispense:  46 tablet    Refill:  0    Orders Placed This Encounter  Procedures  . Flu Vaccine QUAD 36+ mos PF IM (Fluarix & Fluzone Quad PF)  . CBC with Differential/Platelet  . Lipid panel  . Ferritin  . TSH  . T4, free  . Vitamin B12  . VITAMIN D 25 Hydroxy (Vit-D Deficiency, Fractures)  . COMPLETE METABOLIC PANEL WITH GFR  . Ambulatory referral to Psychiatry

## 2016-02-17 NOTE — Assessment & Plan Note (Signed)
Check labs 

## 2016-02-22 NOTE — Assessment & Plan Note (Signed)
Sounds to be related to her binge eating, she may be a candidate for Vyvanse, but will have her psych first

## 2016-02-28 ENCOUNTER — Telehealth: Payer: Self-pay | Admitting: Family Medicine

## 2016-02-28 NOTE — Telephone Encounter (Signed)
Reviewed labs, patient wants to take leave from school and wants to know if you will write letter?

## 2016-02-28 NOTE — Telephone Encounter (Signed)
Absolutely, I'll be glad to write her a letter I called patient Left msg, okay for letter; I'd love to talk with her and see how she's doing since her visit Please call

## 2016-02-28 NOTE — Telephone Encounter (Signed)
Patient is requesting return call for test results. Also, she is requesting a letter for her school (taking medical leave) stating her condition. Patient is willing to pick it up as soon as possible.

## 2016-03-06 ENCOUNTER — Ambulatory Visit (INDEPENDENT_AMBULATORY_CARE_PROVIDER_SITE_OTHER): Payer: 59 | Admitting: Family Medicine

## 2016-03-06 ENCOUNTER — Encounter: Payer: Self-pay | Admitting: Family Medicine

## 2016-03-06 DIAGNOSIS — F5081 Binge eating disorder: Secondary | ICD-10-CM | POA: Diagnosis not present

## 2016-03-06 DIAGNOSIS — F331 Major depressive disorder, recurrent, moderate: Secondary | ICD-10-CM | POA: Diagnosis not present

## 2016-03-06 DIAGNOSIS — R030 Elevated blood-pressure reading, without diagnosis of hypertension: Secondary | ICD-10-CM | POA: Diagnosis not present

## 2016-03-06 DIAGNOSIS — R300 Dysuria: Secondary | ICD-10-CM | POA: Insufficient documentation

## 2016-03-06 LAB — POCT URINALYSIS DIPSTICK
Glucose, UA: NEGATIVE
Ketones, UA: NEGATIVE
Nitrite, UA: NEGATIVE
Spec Grav, UA: >=1.03
Urobilinogen, UA: NEGATIVE
pH, UA: 6

## 2016-03-06 MED ORDER — NITROFURANTOIN MONOHYD MACRO 100 MG PO CAPS: 100.0000 mg | ORAL_CAPSULE | Freq: Two times a day (BID) | ORAL | 0 refills | Status: AC

## 2016-03-06 MED ORDER — FLUOXETINE HCL 40 MG PO CAPS: 40.0000 mg | ORAL_CAPSULE | Freq: Every day | ORAL | 2 refills | Status: DC

## 2016-03-06 NOTE — Patient Instructions (Addendum)
Your goal blood pressure is less than 140 mmHg on top. Try to follow the DASH guidelines (DASH stands for Dietary Approaches to Stop Hypertension) Try to limit the sodium in your diet.  Ideally, consume less than 1.5 grams (less than 1,500mg ) per day. Do not add salt when cooking or at the table.  Check the sodium amount on labels when shopping, and choose items lower in sodium when given a choice. Avoid or limit foods that already contain a lot of sodium. Eat a diet rich in fruits and vegetables and whole grains.  Check your blood pressure about once a week and let us know if not to goal (less than 140/90); ideal blood pressure is less than 120/80 Please do follow-up with the psychiatrist  Start the antibiotic Please do eat yogurt daily or take a probiotic daily for the next month or two We want to replace the healthy germs in the gut If you notice foul, watery diarrhea in the next two months, schedule an appointment RIGHT AWAY    DASH Eating Plan DASH stands for "Dietary Approaches to Stop Hypertension." The DASH eating plan is a healthy eating plan that has been shown to reduce high blood pressure (hypertension). Additional health benefits may include reducing the risk of type 2 diabetes mellitus, heart disease, and stroke. The DASH eating plan may also help with weight loss. WHAT DO I NEED TO KNOW ABOUT THE DASH EATING PLAN? For the DASH eating plan, you will follow these general guidelines:  Choose foods with a percent daily value for sodium of less than 5% (as listed on the food label).  Use salt-free seasonings or herbs instead of table salt or sea salt.  Check with your health care provider or pharmacist before using salt substitutes.  Eat lower-sodium products, often labeled as "lower sodium" or "no salt added."  Eat fresh foods.  Eat more vegetables, fruits, and low-fat dairy products.  Choose whole grains. Look for the word "whole" as the first word in the ingredient  list.  Choose fish and skinless chicken or [place] more often than red meat. Limit fish, poultry, and meat to 6 oz (170 g) each day.  Limit sweets, desserts, sugars, and sugary drinks.  Choose heart-healthy fats.  Limit cheese to 1 oz (28 g) per day.  Eat more home-cooked food and less restaurant, buffet, and fast food.  Limit fried foods.  Cook foods using methods other than frying.  Limit canned vegetables. If you do use them, rinse them well to decrease the sodium.  When eating at a restaurant, ask that your food be prepared with less salt, or no salt if possible. WHAT FOODS CAN I EAT? Seek help from a dietitian for individual calorie needs. Grains Whole grain or whole wheat bread. [place] rice. Whole grain or whole wheat pasta. Quinoa, bulgur, and whole grain cereals. Low-sodium cereals. Corn or whole wheat flour tortillas. Whole grain cornbread. Whole grain crackers. Low-sodium crackers. Vegetables Fresh or frozen vegetables (raw, steamed, roasted, or grilled). Low-sodium or reduced-sodium tomato and vegetable juices. Low-sodium or reduced-sodium tomato sauce and paste. Low-sodium or reduced-sodium canned vegetables.  Fruits All fresh, canned (in natural juice), or frozen fruits. Meat and Other Protein Products Ground beef (85% or leaner), grass-fed beef, or beef trimmed of fat. Skinless chicken or [place]. Ground chicken or [place]. Pork trimmed of fat. All fish and seafood. Eggs. Dried beans, peas, or lentils. Unsalted nuts and seeds. Unsalted canned beans. Dairy Low-fat dairy products, such as skim or 1%  milk, 2% or reduced-fat cheeses, low-fat ricotta or cottage cheese, or plain low-fat yogurt. Low-sodium or reduced-sodium cheeses. Fats and Oils Tub margarines without trans fats. Light or reduced-fat mayonnaise and salad dressings (reduced sodium). Avocado. Safflower, olive, or canola oils. Natural peanut or almond butter. Other Unsalted popcorn and pretzels. The items listed  above may not be a complete list of recommended foods or beverages. Contact your dietitian for more options. WHAT FOODS ARE NOT RECOMMENDED? Grains White bread. White pasta. White rice. Refined cornbread. Bagels and croissants. Crackers that contain trans fat. Vegetables Creamed or fried vegetables. Vegetables in a cheese sauce. Regular canned vegetables. Regular canned tomato sauce and paste. Regular tomato and vegetable juices. Fruits Dried fruits. Canned fruit in light or heavy syrup. Fruit juice. Meat and Other Protein Products Fatty cuts of meat. Ribs, chicken wings, bacon, sausage, bologna, salami, chitterlings, fatback, hot dogs, bratwurst, and packaged luncheon meats. Salted nuts and seeds. Canned beans with salt. Dairy Whole or 2% milk, cream, half-and-half, and cream cheese. Whole-fat or sweetened yogurt. Full-fat cheeses or blue cheese. Nondairy creamers and whipped toppings. Processed cheese, cheese spreads, or cheese curds. Condiments Onion and garlic salt, seasoned salt, table salt, and sea salt. Canned and packaged gravies. Worcestershire sauce. Tartar sauce. Barbecue sauce. Teriyaki sauce. Soy sauce, including reduced sodium. Steak sauce. Fish sauce. Oyster sauce. Cocktail sauce. Horseradish. Ketchup and mustard. Meat flavorings and tenderizers. Bouillon cubes. Hot sauce. Tabasco sauce. Marinades. Taco seasonings. Relishes. Fats and Oils Butter, stick margarine, lard, shortening, ghee, and bacon fat. Coconut, palm kernel, or palm oils. Regular salad dressings. Other Pickles and olives. Salted popcorn and pretzels. The items listed above may not be a complete list of foods and beverages to avoid. Contact your dietitian for more information. WHERE CAN I FIND MORE INFORMATION? National Heart, Lung, and Blood Institute: [employer]   This information is not intended to replace advice given to you by your health care provider. Make sure you  discuss any questions you have with your health care provider.   Document Released: 04/19/2011 Document Revised: 05/21/2014 Document Reviewed: 03/04/2013 Elsevier Interactive Patient Education [employer]

## 2016-03-06 NOTE — Telephone Encounter (Signed)
Patient was seen today; doing better; letter written, given to pt

## 2016-03-06 NOTE — Assessment & Plan Note (Addendum)
Check urine today; culture pending

## 2016-03-06 NOTE — Progress Notes (Signed)
BP 136/78 (BP Location: Right Arm, Cuff Size: Large)   Pulse 88   Temp 99.5 F (37.5 C) (Oral)   Resp 16   Wt 295 lb (133.8 kg)   LMP 03/02/2016   SpO2 97%   BMI 52.26 kg/m    Subjective:    Patient ID: Katherine Huynh, female    DOB: May 16, 1995, 20 y.o.   MRN: [id number]  HPI: Katherine Huynh is a 20 y.o. female  Chief Complaint  Patient presents with  . Follow-up    2 week   . Urinary Tract Infection    POSS   Patient is here for depression f/u She has started the medicine; started taking the two pills and got a little nauseated No hallucinations, no God complex, no spending sprees, nothing to suggest mania or hypomania Mood is better; less irritable No thoughts of hurting herself herself or anybody else Appetite has been decreased  Full [employer]; goes to school on-line, [place], [place]; was Biomedical scientist; she has been approved for medical leave for now; will start back November 15th; leave started Sept 25th Patient okay with saying major depressive disorder; needs me to write a letter for her for leave  High blood pressure; had steak right before coming here; hash browns with salt, lots of it; puts salt on her eggs; likes salt on her food; not many processed foods  Sx started yesterday; burning with urination; can't empty fully; nocturia; frequency; urgency; no fever; little bit of blood in the urine   Depression screen [facility] 2/9 03/06/2016 02/17/2016 09/15/2015  Decreased Interest 1 3 0  Down, Depressed, Hopeless 1 3 0  PHQ - 2 Score 2 6 0  Altered sleeping 1 3 -  Tired, decreased energy 1 3 -  Change in appetite 1 3 -  Feeling bad or failure about yourself  1 3 -  Trouble concentrating 0 3 -  Moving slowly or fidgety/restless 0 0 -  Suicidal thoughts 0 0 -  PHQ-9 Score 6 21 -  Difficult doing work/chores Not difficult at all Not difficult at all -   Relevant past medical, surgical, family and social history reviewed Past Medical History:    Diagnosis Date  . Depression   . Fam hx-ischem heart disease 09/15/2015  . Morbid obesity (HCC) 02/17/2016   Past Surgical History:  Procedure Laterality Date  . NO PAST SURGERIES     Family History  Problem Relation Age of Onset  . Cancer Maternal Grandmother     breast  . Heart disease Maternal Grandfather   . Heart disease Paternal Grandfather    Social History  Substance Use Topics  . Smoking status: Never Smoker  . Smokeless tobacco: Never Used  . Alcohol use Yes     Comment: occas   Interim medical history since last visit reviewed. Allergies and medications reviewed  Review of Systems Per HPI unless specifically indicated above     Objective:    BP 136/78 (BP Location: Right Arm, Cuff Size: Large)   Pulse 88   Temp 99.5 F (37.5 C) (Oral)   Resp 16   Wt 295 lb (133.8 kg)   LMP 03/02/2016   SpO2 97%   BMI 52.26 kg/m   Wt Readings from Last 3 Encounters:  03/06/16 295 lb (133.8 kg)  02/17/16 293 lb 4 oz (133 kg)  11/29/15 294 lb 14.4 oz (133.8 kg)    Today's Vitals   03/06/16 1446 03/06/16 1447 03/06/16 1516  BP: Marland Kitchen)  142/94  136/78  Pulse: 88    Resp: 16    Temp: 99.5 F (37.5 C)    TempSrc: Oral    SpO2: 97%    Weight: 295 lb (133.8 kg)    PainSc:  5      Physical Exam  Constitutional: She appears well-developed and well-nourished. No distress.  Eyes: EOM are normal. No scleral icterus.  Neck: No thyromegaly present.  Cardiovascular: Normal rate.   Pulmonary/Chest: Effort normal.  Abdominal: She exhibits no distension.  Morbidly obese  Skin: No pallor.  Psychiatric: She has a normal mood and affect. Her behavior is normal. Judgment and thought content normal. Her mood appears not anxious. Her affect is not angry, not blunt, not labile and not inappropriate. Her speech is not rapid and/or pressured, not delayed and not tangential. She is not agitated, not hyperactive, not slowed and not actively hallucinating. Thought content is not paranoid  and not delusional. Cognition and memory are not impaired. She does not express impulsivity or inappropriate judgment. She does not exhibit a depressed mood. She expresses no homicidal and no suicidal ideation. She is attentive.      Assessment & Plan:   Problem List Items Addressed This Visit      Other   Morbid obesity (HCC)    Hopeful that as her mood is treated, she'll do less binge eating; awaiting appt with psychiatrist; something like Vyvanse would not be able to be used until her BP is controlled, though      Elevated blood pressure reading    First BP upon check-in was 142/94; recheck came down; will have her try DASH guidelines; discussed risk of stroke, heart attack with HTN and OCPs; patient will work to limit sodium, processed foods, and work with psychiatrist on her binge eating      Dysuria    Check urine today; culture pending      Relevant Orders   POCT urinalysis dipstick (Completed)   Urine Culture (Completed)   Depression, major, recurrent (HCC)    Significant improvement in her PHQ-9 score without any onset of mania or hypomania; continue SSRI and will hopefully get her in to see psychiatrist soon      Relevant Medications   FLUoxetine (PROZAC) 40 MG capsule   Binge eating disorder    Referral already entered to psychiatrist; would not be able to start Vyvanse until BP controlled though       Other Visit Diagnoses   None.      Follow up plan: Return in about 6 weeks (around 04/17/2016) for follow-up.  An after-visit summary was printed and given to the patient at check-out.  Please see the patient instructions which may contain other information and recommendations beyond what is mentioned above in the assessment and plan.  Meds ordered this encounter  Medications  . FLUoxetine (PROZAC) 40 MG capsule    Sig: Take 1 capsule (40 mg total) by mouth daily.    Dispense:  30 capsule    Refill:  2  . nitrofurantoin, macrocrystal-monohydrate, (MACROBID)  100 MG capsule    Sig: Take 1 capsule (100 mg total) by mouth 2 (two) times daily.    Dispense:  10 capsule    Refill:  0    Orders Placed This Encounter  Procedures  . Urine Culture  . POCT urinalysis dipstick

## 2016-03-08 LAB — URINE CULTURE

## 2016-03-11 ENCOUNTER — Encounter: Payer: Self-pay | Admitting: Family Medicine

## 2016-03-11 DIAGNOSIS — R030 Elevated blood-pressure reading, without diagnosis of hypertension: Secondary | ICD-10-CM | POA: Insufficient documentation

## 2016-03-11 NOTE — Assessment & Plan Note (Signed)
First BP upon check-in was 142/94; recheck came down; will have her try DASH guidelines; discussed risk of stroke, heart attack with HTN and OCPs; patient will work to limit sodium, processed foods, and work with psychiatrist on her binge eating

## 2016-03-11 NOTE — Assessment & Plan Note (Signed)
Referral already entered to psychiatrist; would not be able to start Vyvanse until BP controlled though

## 2016-03-11 NOTE — Assessment & Plan Note (Signed)
Significant improvement in her PHQ-9 score without any onset of mania or hypomania; continue SSRI and will hopefully get her in to see psychiatrist soon

## 2016-03-11 NOTE — Assessment & Plan Note (Signed)
Hopeful that as her mood is treated, she'll do less binge eating; awaiting appt with psychiatrist; something like Vyvanse would not be able to be used until her BP is controlled, though

## 2016-03-21 ENCOUNTER — Ambulatory Visit (INDEPENDENT_AMBULATORY_CARE_PROVIDER_SITE_OTHER): Payer: 59 | Admitting: Obstetrics and Gynecology

## 2016-03-21 ENCOUNTER — Encounter: Payer: Self-pay | Admitting: Obstetrics and Gynecology

## 2016-03-21 VITALS — BP 123/81 | HR 85 | Ht 63.0 in | Wt 293.0 lb

## 2016-03-21 DIAGNOSIS — Z79899 Other long term (current) drug therapy: Secondary | ICD-10-CM

## 2016-03-21 DIAGNOSIS — Z3169 Encounter for other general counseling and advice on procreation: Secondary | ICD-10-CM | POA: Diagnosis not present

## 2016-03-21 MED ORDER — SPRINTEC 28 0.25-35 MG-MCG PO TABS: 1.0000 | ORAL_TABLET | Freq: Every day | ORAL | 6 refills | Status: DC

## 2016-03-21 NOTE — Progress Notes (Signed)
Subjective:     Patient ID: Hoyle BarrJalisa Millay, female   DOB: 10/30/1995, 20 y.o.   MRN: [id number]  HPI Happy with current OCP and desires refill. States they plan to try for pregnancy in Feb of next year. States understanding on needing to lose weight, has recently switched to vegetarian eating (does eat eggs and milk products). States she is feeling much better on the prozac.  Review of Systems Negative except stated above in HPI    Objective:   Physical Exam A&O x4 Well groomed female in no distress Blood pressure 123/81, pulse 85, height 5\' 3"  (1.6 m), weight 293 lb (132.9 kg), last menstrual period 03/02/2016. No PE indicated     Assessment:     Medication management Morbid obesity Preconception counseling    Plan:     Refilled current OCP Discussed weight loss and potential pregnancy complication due to BMI of 51, including hypertension, gestational diabetes, anesthesia complications, miscarriage,  & infertility. Strongly recommended weight loss before conception.  States a clear understanding.  RTC as needed. >50% of 15 minute visit spent in counseling.  Ark Agrusa KingwoodShambley, CNM

## 2016-04-12 ENCOUNTER — Encounter: Payer: 59 | Admitting: Obstetrics and Gynecology

## 2016-04-17 ENCOUNTER — Encounter: Payer: Self-pay | Admitting: Family Medicine

## 2016-04-17 ENCOUNTER — Ambulatory Visit (INDEPENDENT_AMBULATORY_CARE_PROVIDER_SITE_OTHER): Payer: 59 | Admitting: Family Medicine

## 2016-04-17 VITALS — BP 122/80 | HR 94 | Temp 99.6°F | Resp 14 | Wt 290.0 lb

## 2016-04-17 DIAGNOSIS — F331 Major depressive disorder, recurrent, moderate: Secondary | ICD-10-CM

## 2016-04-17 DIAGNOSIS — R112 Nausea with vomiting, unspecified: Secondary | ICD-10-CM

## 2016-04-17 LAB — POCT URINE PREGNANCY: Preg Test, Ur: NEGATIVE

## 2016-04-17 MED ORDER — FLUOXETINE HCL 40 MG PO CAPS: 40.0000 mg | ORAL_CAPSULE | Freq: Every day | ORAL | 1 refills | Status: DC

## 2016-04-17 NOTE — Patient Instructions (Addendum)
Continue your current medicines De-stress :-) You are most fertile mid-way through your cycle, especially when you notice the cervical plug / clear discharge Please call and schedule an appt with psychiatrist at [phone]  12 Ways to Curb Anxiety  ?Anxiety is normal human sensation. It is what helped our ancestors survive the pitfalls of the wilderness. Anxiety is defined as experiencing worry or nervousness about an imminent event or something with an uncertain outcome. It is a feeling experienced by most people at some point in their lives. Anxiety can be triggered by a very personal issue, such as the illness of a loved one, or an event of global proportions, such as a refugee crisis. Some of the symptoms of anxiety are:  Feeling restless.  Having a feeling of impending danger.  Increased heart rate.  Rapid breathing. Sweating.  Shaking.  Weakness or feeling tired.  Difficulty concentrating on anything except the current worry.  Insomnia.  Stomach or bowel problems. What can we do about anxiety we may be feeling? There are many techniques to help manage stress and relax. Here are 12 ways you can reduce your anxiety almost immediately: 1. Turn off the constant feed of information. Take a social media sabbatical. Studies have shown that social media directly contributes to social anxiety.  2. Monitor your television viewing habits. Are you watching shows that are also contributing to your anxiety, such as 24-hour news stations? Try watching something else, or better yet, nothing at all. Instead, listen to music, read an inspirational book or practice a hobby. 3. Eat nutritious meals. Also, don't skip meals and keep healthful snacks on hand. Hunger and poor diet contributes to feeling anxious. 4. Sleep. Sleeping on a regular schedule for at least seven to eight hours a night will do wonders for your outlook when you are awake. 5. Exercise. Regular exercise will help rid your body of that  anxious energy and help you get more restful sleep. 6. Try deep (diaphragmatic) breathing. Inhale slowly through your nose for five seconds and exhale through your mouth. 7. Practice acceptance and gratitude. When anxiety hits, accept that there are things out of your control that shouldn't be of immediate concern.  8. Seek out humor. When anxiety strikes, watch a funny video, read jokes or call a friend who makes you laugh. Laughter is healing for our bodies and releases endorphins that are calming. 9. Stay positive. Take the effort to replace negative thoughts with positive ones. Try to see a stressful situation in a positive light. Try to come up with solutions rather than dwelling on the problem. 10. Figure out what triggers your anxiety. Keep a journal and make note of anxious moments and the events surrounding them. This will help you identify triggers you can avoid or even eliminate. 11. Talk to someone. Let a trusted friend, family member or even trained professional know that you are feeling overwhelmed and anxious. Verbalize what you are feeling and why.  12. Volunteer. If your anxiety is triggered by a crisis on a large scale, become an advocate and work to resolve the problem that is causing you unease. Anxiety is often unwelcome and can become overwhelming. If not kept in check, it can become a disorder that could require medical treatment. However, if you take the time to care for yourself and avoid the triggers that make you anxious, you will be able to find moments of relaxation and clarity that make your life much more enjoyable.

## 2016-04-17 NOTE — Progress Notes (Signed)
BP 122/80   Pulse 94   Temp 99.6 F (37.6 C) (Oral)   Resp 14   Wt 290 lb (131.5 kg)   LMP 03/20/2016   SpO2 97%   BMI 51.37 kg/m    Subjective:    Patient ID: Katherine Huynh, female    DOB: 05/16/1995, 20 y.o.   MRN: [id number]  HPI: Katherine Huynh is a 20 y.o. female  Chief Complaint  Patient presents with  . Follow-up    She is here for f/u of depression She loves this time of year; not anything with stress of holidays She is trying to have a baby; GYN says weight is a factor; she is a vegetarian; taking B12 Her PHQ-9 score has worsened since her last visit but she says that she is upset because she wants to have a baby and can't get pregnant; see below (it turns out she has only been off her birth control a few weeks) I put in a referral to psychiatry on October 6th; she was contacted on November 7th and again on November 9th by Pauline AusKimberly Grice to schedule an appt She has been off of her birth control and has been feeling nauseated; she would like a pregnancy test; done for a month; LMP 03/20/16, stopped OCP halfway through Little bit of nausea and nipples are hurting I asked how she feels about the medicines; she thinks they are working and wants to continue Good sleeper  [facility] 04/17/2016 03/06/2016 02/17/2016 09/15/2015  Decreased Interest 1 1 3  0  Down, Depressed, Hopeless 1 1 3  0  PHQ - 2 Score 2 2 6  0  Altered sleeping 3 1 3  -  Tired, decreased energy 3 1 3  -  Change in appetite 3 1 3  -  Feeling bad or failure about yourself  0 1 3 -  Trouble concentrating 0 0 3 -  Moving slowly or fidgety/restless 0 0 0 -  Suicidal thoughts 0 0 0 -  PHQ-9 Score 11 6 21  -  Difficult doing work/chores Somewhat difficult Not difficult at all Not difficult at all -  MD note: denies any SI/HI  Relevant past medical, surgical, family and social history reviewed Past Medical History:  Diagnosis Date  . Depression   . Fam hx-ischem heart disease 09/15/2015  . Morbid  obesity (HCC) 02/17/2016   Past Surgical History:  Procedure Laterality Date  . NO PAST SURGERIES     Family History  Problem Relation Age of Onset  . Cancer Maternal Grandmother     breast  . Heart disease Maternal Grandfather   . Heart disease Paternal Grandfather    Social History  Substance Use Topics  . Smoking status: Never Smoker  . Smokeless tobacco: Never Used  . Alcohol use Yes     Comment: occas   Interim medical history since last visit reviewed. Allergies and medications reviewed  Review of Systems Per HPI unless specifically indicated above     Objective:    BP 122/80   Pulse 94   Temp 99.6 F (37.6 C) (Oral)   Resp 14   Wt 290 lb (131.5 kg)   LMP 03/20/2016   SpO2 97%   BMI 51.37 kg/m   Wt Readings from Last 3 Encounters:  04/19/16 288 lb 12.8 oz (131 kg)  04/17/16 290 lb (131.5 kg)  03/21/16 293 lb (132.9 kg)    Physical Exam  Constitutional: She appears well-developed and well-nourished. No distress.  Eyes: EOM are normal. No  scleral icterus.  Neck: No thyromegaly present.  Cardiovascular: Normal rate.   Pulmonary/Chest: Effort normal.  Abdominal: She exhibits no distension.  Morbidly obese  Skin: No pallor.  Psychiatric: She has a normal mood and affect. Her behavior is normal. Judgment and thought content normal. Her mood appears not anxious. Her affect is not angry, not blunt, not labile and not inappropriate. Her speech is not rapid and/or pressured, not delayed and not tangential. She is not agitated, not hyperactive, not slowed and not actively hallucinating. Thought content is not paranoid and not delusional. Cognition and memory are not impaired. She does not express impulsivity or inappropriate judgment. She does not exhibit a depressed mood. She expresses no homicidal and no suicidal ideation.  Good eye contact with examiner; not tearful; not despondent; forward/future thinking She is attentive.       Assessment & Plan:   Problem  List Items Addressed This Visit      Other   Morbid obesity (HCC)    She has lost five pounds over last several weeks; she has been told recently by GYN that her obesity may affect her ability to become pregnant or have healthy pregnancy; I am here to help her with nutrition referral      Depression, major, recurrent (HCC)    Patient wishes to continue the SSRI; checking on psychiatry referral with staff today; encouraged mental health follow-up, psychiatrist and therapist; reasons to seek help known to patient; in regards to her inability to get pregnant after only a few weeks of being off of OCP, this is not indicative of infertility and I suggested she give it more time before she becomes frustrated or discouraged with not conceiving; she'll work with her GYN; did explain when most fertile time of cycle is      Relevant Medications   FLUoxetine (PROZAC) 40 MG capsule    Other Visit Diagnoses    Nausea and vomiting, intractability of vomiting not specified, unspecified vomiting type    -  Primary   Relevant Orders   POCT urine pregnancy (Completed)      Follow up plan: No Follow-up on file.  An after-visit summary was printed and given to the patient at check-out.  Please see the patient instructions which may contain other information and recommendations beyond what is mentioned above in the assessment and plan.  Meds ordered this encounter  Medications  . Prenatal Vit-Fe Fumarate-FA (PRENATAL VITAMINS) 28-0.8 MG TABS    Sig: Take by mouth.  Marland Kitchen. FLUoxetine (PROZAC) 40 MG capsule    Sig: Take 1 capsule (40 mg total) by mouth daily.    Dispense:  90 capsule    Refill:  1    Orders Placed This Encounter  Procedures  . POCT urine pregnancy

## 2016-04-19 ENCOUNTER — Encounter: Payer: Self-pay | Admitting: Obstetrics and Gynecology

## 2016-04-19 ENCOUNTER — Ambulatory Visit (INDEPENDENT_AMBULATORY_CARE_PROVIDER_SITE_OTHER): Payer: 59 | Admitting: Obstetrics and Gynecology

## 2016-04-19 VITALS — BP 128/84 | HR 73 | Ht 63.0 in | Wt 288.8 lb

## 2016-04-19 DIAGNOSIS — Z01419 Encounter for gynecological examination (general) (routine) without abnormal findings: Secondary | ICD-10-CM

## 2016-04-19 NOTE — Patient Instructions (Signed)
 Preventive Care 18-39 Years, Female Preventive care refers to lifestyle choices and visits with your health care provider that can promote health and wellness. What does preventive care include?  A yearly physical exam. This is also called an annual well check.  Dental exams once or twice a year.  Routine eye exams. Ask your health care provider how often you should have your eyes checked.  Personal lifestyle choices, including:  Daily care of your teeth and gums.  Regular physical activity.  Eating a healthy diet.  Avoiding tobacco and drug use.  Limiting alcohol use.  Practicing safe sex.  Taking vitamin and mineral supplements as recommended by your health care provider. What happens during an annual well check? The services and screenings done by your health care provider during your annual well check will depend on your age, overall health, lifestyle risk factors, and family history of disease. Counseling  Your health care provider may ask you questions about your:  Alcohol use.  Tobacco use.  Drug use.  Emotional well-being.  Home and relationship well-being.  Sexual activity.  Eating habits.  Work and work environment.  Method of birth control.  Menstrual cycle.  Pregnancy history. Screening  You may have the following tests or measurements:  Height, weight, and BMI.  Diabetes screening. This is done by checking your blood sugar (glucose) after you have not eaten for a while (fasting).  Blood pressure.  Lipid and cholesterol levels. These may be checked every 5 years starting at age 20.  Skin check.  Hepatitis C blood test.  Hepatitis B blood test.  Sexually transmitted disease (STD) testing.  BRCA-related cancer screening. This may be done if you have a family history of breast, ovarian, tubal, or peritoneal cancers.  Pelvic exam and Pap test. This may be done every 3 years starting at age 21. Starting at age 30, this may be done  every 5 years if you have a Pap test in combination with an HPV test. Discuss your test results, treatment options, and if necessary, the need for more tests with your health care provider. Vaccines  Your health care provider may recommend certain vaccines, such as:  Influenza vaccine. This is recommended every year.  Tetanus, diphtheria, and acellular pertussis (Tdap, Td) vaccine. You may need a Td booster every 10 years.  Varicella vaccine. You may need this if you have not been vaccinated.  HPV vaccine. If you are 26 or younger, you may need three doses over 6 months.  Measles, mumps, and rubella (MMR) vaccine. You may need at least one dose of MMR. You may also need a second dose.  Pneumococcal 13-valent conjugate (PCV13) vaccine. You may need this if you have certain conditions and were not previously vaccinated.  Pneumococcal polysaccharide (PPSV23) vaccine. You may need one or two doses if you smoke cigarettes or if you have certain conditions.  Meningococcal vaccine. One dose is recommended if you are age 19-21 years and a first-year college student living in a residence hall, or if you have one of several medical conditions. You may also need additional booster doses.  Hepatitis A vaccine. You may need this if you have certain conditions or if you travel or work in places where you may be exposed to hepatitis A.  Hepatitis B vaccine. You may need this if you have certain conditions or if you travel or work in places where you may be exposed to hepatitis B.  Haemophilus influenzae type b (Hib) vaccine. You may need   this if you have certain risk factors. Talk to your health care provider about which screenings and vaccines you need and how often you need them. This information is not intended to replace advice given to you by your health care provider. Make sure you discuss any questions you have with your health care provider. Document Released: 06/26/2001 Document Revised:  01/18/2016 Document Reviewed: 03/01/2015 Elsevier Interactive Patient Education  2017 Elsevier Inc.  

## 2016-04-19 NOTE — Progress Notes (Signed)
   Subjective:     Katherine Huynh is a 20 y.o. female and is here for a comprehensive physical exam. The patient reports no problems.  Social History   Social History  . Marital status: Single    Spouse name: N/A  . Number of children: N/A  . Years of education: N/A   Occupational History  . Not on file.   Social History Main Topics  . Smoking status: Never Smoker  . Smokeless tobacco: Never Used  . Alcohol use Yes     Comment: occas  . Drug use: No  . Sexual activity: Yes    Birth control/ protection: None   Other Topics Concern  . Not on file   Social History Narrative  . No narrative on file   Health Maintenance  Topic Date Due  . TETANUS/TDAP  06/22/2014  . CHLAMYDIA SCREENING  04/11/2016  . HIV Screening  05/07/2016 (Originally 06/22/2010)  . INFLUENZA VACCINE  Completed    The following portions of the patient's history were reviewed and updated as appropriate: allergies, current medications, past family history, past medical history, past social history, past surgical history and problem list.  Review of Systems A comprehensive review of systems was negative.   Objective:    General appearance: alert, cooperative, appears stated age and morbidly obese Neck: no adenopathy, no carotid bruit, no JVD, supple, symmetrical, trachea midline and thyroid not enlarged, symmetric, no tenderness/mass/nodules Breasts: normal appearance, no masses or tenderness Heart: regular rate and rhythm, S1, S2 normal, no murmur, click, rub or gallop Abdomen: soft, non-tender; bowel sounds normal; no masses,  no organomegaly Pelvic: cervix normal in appearance, external genitalia normal, no adnexal masses or tenderness, no cervical motion tenderness, rectovaginal septum normal, uterus normal size, shape, and consistency and vagina normal without discharge    Assessment:    Healthy female exam.  Morbid obesity   Depression on SSRI  Plan:  aptima obtained Reiterated need for weight  loss before trying for pregnancy. Flu vaccine up to date.  RTC 1 year  Katherine Huynh, [place]M   See After Visit Summary for Counseling Recommendations

## 2016-04-20 NOTE — Addendum Note (Signed)
Addended by: Rosine BeatLONTZ, AMY L on: 04/20/2016 09:23 AM   Modules accepted: Orders

## 2016-04-25 LAB — GC/CHLAMYDIA PROBE AMP
Chlamydia trachomatis, NAA: NEGATIVE
Neisseria gonorrhoeae by PCR: NEGATIVE

## 2016-04-25 NOTE — Assessment & Plan Note (Signed)
She has lost five pounds over last several weeks; she has been told recently by GYN that her obesity may affect her ability to become pregnant or have healthy pregnancy; I am here to help her with nutrition referral

## 2016-04-25 NOTE — Assessment & Plan Note (Signed)
Patient wishes to continue the SSRI; checking on psychiatry referral with staff today; encouraged mental health follow-up, psychiatrist and therapist; reasons to seek help known to patient; in regards to her inability to get pregnant after only a few weeks of being off of OCP, this is not indicative of infertility and I suggested she give it more time before she becomes frustrated or discouraged with not conceiving; she'll work with her GYN; did explain when most fertile time of cycle is

## 2016-06-13 ENCOUNTER — Encounter: Payer: 59 | Admitting: Obstetrics and Gynecology

## 2016-06-15 ENCOUNTER — Encounter: Payer: Self-pay | Admitting: Obstetrics and Gynecology

## 2016-06-15 ENCOUNTER — Ambulatory Visit (INDEPENDENT_AMBULATORY_CARE_PROVIDER_SITE_OTHER): Payer: 59 | Admitting: Obstetrics and Gynecology

## 2016-06-15 VITALS — BP 116/80 | HR 83 | Ht 63.0 in | Wt 292.9 lb

## 2016-06-15 DIAGNOSIS — N912 Amenorrhea, unspecified: Secondary | ICD-10-CM | POA: Diagnosis not present

## 2016-06-15 LAB — POCT URINE PREGNANCY: Preg Test, Ur: NEGATIVE

## 2016-06-15 NOTE — Progress Notes (Signed)
Pt is here her last period was 04/25/16, hasn't had a period for this month, took home pregnancy test was negative, test here in the office today was negative as well, will draw beta hcg and call pt on Monday with results

## 2016-06-16 LAB — BETA HCG QUANT (REF LAB): hCG Quant: <1 m[IU]/mL

## 2016-06-17 ENCOUNTER — Other Ambulatory Visit: Payer: Self-pay | Admitting: Family Medicine

## 2016-06-18 ENCOUNTER — Encounter: Payer: Self-pay | Admitting: Family Medicine

## 2016-06-19 ENCOUNTER — Telehealth: Payer: Self-pay | Admitting: Obstetrics and Gynecology

## 2016-06-19 NOTE — Telephone Encounter (Signed)
Called pt-gave results

## 2016-06-19 NOTE — Telephone Encounter (Signed)
PT WAS SEEN Friday FOR A PREG TEST AND SHE HAS NOT HEARD BACK ABOUT THE RESULTS. PT IS REQ A CALL BACK.

## 2016-06-20 ENCOUNTER — Ambulatory Visit (INDEPENDENT_AMBULATORY_CARE_PROVIDER_SITE_OTHER): Payer: 59 | Admitting: Obstetrics and Gynecology

## 2016-06-20 ENCOUNTER — Other Ambulatory Visit: Payer: Self-pay | Admitting: Obstetrics and Gynecology

## 2016-06-20 ENCOUNTER — Other Ambulatory Visit (INDEPENDENT_AMBULATORY_CARE_PROVIDER_SITE_OTHER): Payer: 59

## 2016-06-20 ENCOUNTER — Encounter: Payer: Self-pay | Admitting: Obstetrics and Gynecology

## 2016-06-20 VITALS — BP 124/84 | HR 88 | Wt 295.0 lb

## 2016-06-20 DIAGNOSIS — E282 Polycystic ovarian syndrome: Secondary | ICD-10-CM | POA: Diagnosis not present

## 2016-06-20 DIAGNOSIS — N911 Secondary amenorrhea: Secondary | ICD-10-CM

## 2016-06-20 MED ORDER — DROSPIREN-ETH ESTRAD-LEVOMEFOL 3-0.02-0.451 MG PO TABS: 1.0000 | ORAL_TABLET | Freq: Every day | ORAL | 11 refills | Status: DC

## 2016-06-20 NOTE — Progress Notes (Signed)
Subjective:     Patient ID: Katherine Huynh, female   DOB: 11/19/1995, 21 y.o.   MRN: [id number]  HPI Here to discussed absence of menses, negative quant last week. Denies any menses in months. No s/s of menses coming. Strongly desires pregnancy. Is currently not using BC.  Review of Systems Negative except stated above.    Objective:   Physical Exam A&O x4 Well groomed obese female in no distress Blood pressure 124/84, pulse 88, weight 295 lb (133.8 kg), last menstrual period 04/29/2016. Ultrasound today reveals:  Indications: Irregular Menses, question PCOS.  Findings:  The uterus is anteverted and measures 7.1 x 3.5 x 3.9 cm. Echo texture is homogenous without evidence of focal masses.  The Endometrium measures 2.7 mm.  Right Ovary measures 3.6 x 1.9 x 2.8 cm. There are multiple small follicles seen throughout the ovary as well as along the periphery giving a "string of pearls" appearance which is suggestive of PCOS. Left Ovary measures 2.8 x 1.5 x 2.8 cm. Same as right ovary. Survey of the adnexa demonstrates no adnexal masses. There is no free fluid in the cul de sac.  Impression: 1. Multiple small follicles in each ovary with a "string of pearls" appearance which is suggestive of PCOS.    Assessment:     PCOS Morbid obesity Secondary amenorrhea    Plan:     Refer to endocrinologist for treatment Labs obtained today as patient is fasting Counseled on PCOS, treatment and expected outcomes. Will go ahead and start OCP. >50 % of visit spent in counseling.  Melody FloralaShambley, CNM

## 2016-06-21 LAB — TESTOSTERONE, FREE, TOTAL, SHBG
Sex Hormone Binding: 30.2 nmol/L (ref 24.6–122.0)
Testosterone, Free: 4 pg/mL (ref 0.0–4.2)
Testosterone: 58 ng/dL — ABNORMAL HIGH (ref 8–48)

## 2016-06-21 LAB — LIPID PANEL
Chol/HDL Ratio: 3.6 ratio units (ref 0.0–4.4)
Cholesterol, Total: 154 mg/dL (ref 100–199)
HDL: 43 mg/dL (ref >39)
LDL Calculated: 102 mg/dL — ABNORMAL HIGH (ref 0–99)
Triglycerides: 44 mg/dL (ref 0–149)
VLDL Cholesterol Cal: 9 mg/dL (ref 5–40)

## 2016-06-21 LAB — COMPREHENSIVE METABOLIC PANEL
ALT: 13 IU/L (ref 0–32)
AST: 15 IU/L (ref 0–40)
Albumin/Globulin Ratio: 1.3 (ref 1.2–2.2)
Albumin: 4.2 g/dL (ref 3.5–5.5)
Alkaline Phosphatase: 102 IU/L (ref 39–117)
BUN/Creatinine Ratio: 12 (ref 9–23)
BUN: 10 mg/dL (ref 6–20)
Bilirubin Total: 0.3 mg/dL (ref 0.0–1.2)
CO2: 22 mmol/L (ref 18–29)
Calcium: 8.7 mg/dL (ref 8.7–10.2)
Chloride: 101 mmol/L (ref 96–106)
Creatinine, Ser: 0.82 mg/dL (ref 0.57–1.00)
GFR calc Af Amer: 119 mL/min/{1.73_m2} (ref >59)
GFR calc non Af Amer: 103 mL/min/{1.73_m2} (ref >59)
Globulin, Total: 3.3 g/dL (ref 1.5–4.5)
Glucose: 92 mg/dL (ref 65–99)
Potassium: 4.1 mmol/L (ref 3.5–5.2)
Sodium: 138 mmol/L (ref 134–144)
Total Protein: 7.5 g/dL (ref 6.0–8.5)

## 2016-06-21 LAB — DHEA-SULFATE: DHEA-SO4: 381.3 ug/dL (ref 110.0–431.7)

## 2016-06-21 LAB — THYROID PANEL WITH TSH
Free Thyroxine Index: 1.7 (ref 1.2–4.9)
T3 Uptake Ratio: 25 % (ref 24–39)
T4, Total: 6.6 ug/dL (ref 4.5–12.0)
TSH: 2.96 u[IU]/mL (ref 0.450–4.500)

## 2016-06-21 LAB — INSULIN, RANDOM: INSULIN: 24.1 u[IU]/mL (ref 2.6–24.9)

## 2016-06-21 LAB — HEMOGLOBIN A1C
Est. average glucose Bld gHb Est-mCnc: 111 mg/dL
Hgb A1c MFr Bld: 5.5 % (ref 4.8–5.6)

## 2016-06-21 LAB — PROLACTIN: Prolactin: 7.6 ng/mL (ref 4.8–23.3)

## 2016-06-21 LAB — FOLLICLE STIMULATING HORMONE: FSH: 5.5 m[IU]/mL

## 2016-06-21 LAB — PROGESTERONE: Progesterone: 0.1 ng/mL

## 2016-07-11 ENCOUNTER — Emergency Department
Admission: EM | Admit: 2016-07-11 | Discharge: 2016-07-11 | Disposition: A | Discharge status: Home / Self Care | Payer: 59 | Attending: Emergency Medicine | Admitting: Emergency Medicine

## 2016-07-11 ENCOUNTER — Emergency Department: Payer: 59

## 2016-07-11 ENCOUNTER — Encounter: Payer: Self-pay | Admitting: Emergency Medicine

## 2016-07-11 DIAGNOSIS — Y999 Unspecified external cause status: Secondary | ICD-10-CM | POA: Diagnosis not present

## 2016-07-11 DIAGNOSIS — Y92410 Unspecified street and highway as the place of occurrence of the external cause: Secondary | ICD-10-CM | POA: Insufficient documentation

## 2016-07-11 DIAGNOSIS — S3992XA Unspecified injury of lower back, initial encounter: Secondary | ICD-10-CM | POA: Diagnosis present

## 2016-07-11 DIAGNOSIS — M545 Low back pain: Secondary | ICD-10-CM | POA: Insufficient documentation

## 2016-07-11 DIAGNOSIS — Y939 Activity, unspecified: Secondary | ICD-10-CM | POA: Insufficient documentation

## 2016-07-11 HISTORY — DX: Polycystic ovarian syndrome: E28.2

## 2016-07-11 LAB — POCT PREGNANCY, URINE: Preg Test, Ur: NEGATIVE

## 2016-07-11 MED ORDER — MELOXICAM 7.5 MG PO TABS: 7.5000 mg | ORAL_TABLET | Freq: Every day | ORAL | 1 refills | Status: AC

## 2016-07-11 MED ORDER — CYCLOBENZAPRINE HCL 5 MG PO TABS: 5.0000 mg | ORAL_TABLET | Freq: Three times a day (TID) | ORAL | 0 refills | Status: AC | PRN
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
## 2016-08-03 ENCOUNTER — Telehealth: Payer: Self-pay | Admitting: Internal Medicine

## 2016-08-03 ENCOUNTER — Ambulatory Visit: Payer: Self-pay | Admitting: Internal Medicine

## 2016-08-03 NOTE — Telephone Encounter (Signed)
Patient no showed today's appt. Please advise on how to follow up. °A. No follow up necessary. °B. Follow up urgent. Contact patient immediately. °C. Follow up necessary. Contact patient and schedule visit in ___ days. °D. Follow up advised. Contact patient and schedule visit in ____weeks. ° °

## 2016-09-17 ENCOUNTER — Ambulatory Visit: Payer: 59 | Admitting: Family Medicine

## 2016-10-02 ENCOUNTER — Ambulatory Visit (INDEPENDENT_AMBULATORY_CARE_PROVIDER_SITE_OTHER): Payer: 59 | Admitting: Internal Medicine

## 2016-10-02 ENCOUNTER — Encounter: Payer: Self-pay | Admitting: Internal Medicine

## 2016-10-02 DIAGNOSIS — E282 Polycystic ovarian syndrome: Secondary | ICD-10-CM | POA: Diagnosis not present

## 2016-10-02 NOTE — Progress Notes (Signed)
Patient ID: Katherine Huynh, female   DOB: 10-15-1995, 21 y.o.   MRN: [id number]   HPI: [name] is a 21 y.o. female, referred by Dr Sherie Don, for evaluation and management of PCOS. She is here with her boyfriend who offers part.  She describes being on OCPs for 3 years >> stopped 03/2016 >> no cycles in 06/2016.  Fertility/Menstrual cycles: - irregular menses - 2 weeks long, then skipped 3 mo  - since menarche - + TV U/S: ovarian cysts c/w PCOS - children: no - miscarriages: 2 - contraception: no She desires a pregnancy.  Acne: - no  Hirsutism: - on chin  Weight gain: - + last 2 years: 100 lbs  - no steroid use - She was on weight loss meds: B12 and phentermine >> no weight loss - Meals: - Breakfast: egg + cheese biscuit - Lunch: dinner leftovers - Dinner:  Meat + veggies and starch - Snacks: throughout the day: cookie + beef jerkin Drinks: regular sodas - 1-2 a day; green tea - Diets tried: no  - Exercise: she exercised daily for 2h every day + stopped eating meat >> lost 20 lbs in 2 month - fall 2017 + Binge eating disorder  Treatments tried: - did not try Metformin - did not try Spironolactone - did not try Vaniqa - not on OCPs anymore  Reviewed pertinent labs: Component     Latest Ref Rng & Units 06/20/2016  Testosterone     8 - 48 ng/dL 58 (H)  Testosterone Free     0.0 - 4.2 pg/mL 4.0  Sex Horm Binding Glob, Serum     24.6 - 122.0 nmol/L 30.2  FSH     mIU/mL 5.5  Progesterone     ng/mL 0.1  DHEA-SO4     110.0 - 431.7 ug/dL 409.8  Prolactin     4.8 - 23.3 ng/mL 7.6   - Last thyroid tests: Lab Results  Component Value Date   TSH 2.960 06/20/2016   FREET4 1.1 02/17/2016   - Last set of lipids:    Component Value Date/Time   CHOL 154 06/20/2016 1058   TRIG 44 06/20/2016 1058   HDL 43 06/20/2016 1058   CHOLHDL 3.6 06/20/2016 1058   CHOLHDL 2.6 02/17/2016 1120   VLDL 19 02/17/2016 1120   LDLCALC 102 (H) 06/20/2016 1058   - Last HbA1c: Lab Results   Component Value Date   HGBA1C 5.5 06/20/2016   ROS: Constitutional: + weight gain, + fatigue, no subjective hyperthermia/hypothermia Eyes: no blurry vision, no xerophthalmia ENT: no sore throat, no nodules palpated in throat, no dysphagia/odynophagia, no hoarseness Cardiovascular: no CP/+ SOB/no palpitations/+ leg swelling Respiratory: no cough/+ SOB Gastrointestinal: + Nausea, vomiting, diarrhea Musculoskeletal: no muscle/+ joint aches Skin: No acne, + hair on face, + stretch marks Neurological: no tremors/numbness/tingling/dizziness, + headache Psychiatric: + Both: depression/anxiety  Past Medical History:  Diagnosis Date  . Depression   . Fam hx-ischem heart disease 09/15/2015  . Morbid obesity (HCC) 02/17/2016  . Polycystic disease, ovaries    Past Surgical History:  Procedure Laterality Date  . NO PAST SURGERIES     Social History   Social History  . Marital status: Single    Spouse name: N/A  . Number of children: 0   Occupational History  . 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 Smoking status: Never Smoker  . Smokeless tobacco: Never Used  . Alcohol use Yes     Comment: occas  . Drug  use: No  . Sexual activity: Yes    Birth control/ protection: None   Current Outpatient Prescriptions on File Prior to Visit  Medication Sig Dispense Refill  . FLUoxetine (PROZAC) 40 MG capsule Take 1 capsule (40 mg total) by mouth daily. 90 capsule 1  . loratadine (CLARITIN) 10 MG tablet Take 1 tablet (10 mg total) by mouth daily as needed for allergies or itching. 30 tablet 11  . Prenatal Vit-Fe Fumarate-FA (PRENATAL VITAMINS) 28-0.8 MG TABS Take by mouth.     No current facility-administered medications on file prior to visit.    Allergies  Allergen Reactions  . Cinnamon Itching  . Erythromycin Other (See Comments)    Unknown reaction, happened in childhood  . Sulfa Antibiotics Rash   Family History  Problem Relation Age of Onset  . Cancer Maternal Grandmother         breast  . Heart disease Maternal Grandfather   . Heart disease Paternal Grandfather     PE: BP 124/84 (BP Location: Left Arm, Patient Position: Sitting)   Pulse 98   Wt 296 lb (134.3 kg)   LMP 08/15/2016   SpO2 98%   BMI 52.43 kg/m  Wt Readings from Last 3 Encounters:  10/02/16 296 lb (134.3 kg)  07/11/16 295 lb (133.8 kg)  06/20/16 295 lb (133.8 kg)   Constitutional: overweight, in NAD, no full supraclavicular fat pads Eyes: PERRLA, EOMI, no exophthalmos ENT: moist mucous membranes, no thyromegaly, no cervical lymphadenopathy Cardiovascular: RRR, No RG, + 1/6 SEM Respiratory: CTA B Gastrointestinal: abdomen soft, NT, ND, BS+ Musculoskeletal: no deformities, strength intact in all 4 Skin: moist, warm; no acne on face, + dark terminal hair on chin, no vellum on sideburns, + acanthosis nigricans, no purple, wide, stretch marks Neurological: no tremor with outstretched hands, DTR normal in all 4  ASSESSMENT: 1. PCOS  PLAN: 1.  - I reviewed labs obtained by OB/GYN along with the patient. Her total testosterone is slightly elevated, however, the free testosterone is normal. A prolactin level was not high. HbA1c was normal. DHEAS was normal. Her transvaginal ultrasound shows ovarian cyst consistent with PCOS. - I had a long discussion with the patient and her boyfriend about the fact that the PCOS is a misnomer, a patient does not necessarily have to have polycystic ovaries to be diagnosed with the disorder. This is of sum of several conditions, including:  weight gain  insulin resistance (and therefore a higher risk of developing diabetes later in life)  acne  hirsutism  irregular menstrual cycles  decreased fertility. - We also discussed about the fact that the treatment is usually targeted to addressing the problem that concerns the patient the most: acne/hirsutism, weight gain, or fertility, but there is no single treatment for PCOS. The first-line therapy are oral  contraceptives. If she is concerned with her weight, we can use metformin; if she is concerned about acne/hirsutism, we can add spironolactone; and if she is concerned about fertility, I could refer her to reproductive endocrinology for possible use of clomiphene. - Patient stopped having menstrual cycles when she stopped her oral contraceptives, which is consistent with PCOS hypomenorrhea. Since she and her boyfriend are trying to get pregnant, I would not recommend restarting OCPs but I suggested to start metformin for 6 months, and if her menstrual cycles do not become more regular and she does not get pregnant, in that case, I will suggest that she is seen by reproductive endocrinology. - She has a poor diet:  drinking regular sodas, sweet tea, and snacks throughout the day at her office. She refuses a referral to nutrition for now but I strongly advise her to start improving her diet to decrease the risk of prediabetes and diabetes in the future. - Given more written information about PCOS and recommended a PCOS website  - At today's visit, I will check the following: Orders Placed This Encounter  Procedures  . 17-Hydroxyprogesterone  . Androstenedione  . Follicle stimulating hormone  . Luteinizing hormone  . Testosterone, Free, Total, SHBG  - I will send the metformin to her pharmacy as soon as the labs return - We'll see her back in 6 months.  Office Visit on 10/02/2016  Component Date Value Ref Range Status  . 17-OH-Progesterone, LC/MS/MS 10/02/2016 33  ng/dL Final   Comment:  Unable to flag abnormal result(s), please refer     to reference range(s) below: Adult Female Reference Ranges   for 17-Hydroxyprogesterone, LC/MS/MS:     Follicular Phase:   185 ng/dL or less         Luteal Phase:   285 ng/dL or less       Postmenopausal:    45 ng/dL or less            Pregnancy:      First Trimester:   78 - 457 ng/dL     Second Trimester:   90 - 357 ng/dL      Third Trimester:  144 - 578  ng/dL This test was developed and its analytical performance characteristics have been determined by [facility] [place]. It has not been cleared or approved by the U.S. Food and Drug Administration. This assay has been validated pursuant to the CLIA regulations and is used for clinical purposes.   . Androstenedione 10/02/2016 127  ng/dL Final   Comment:   Adult Female Reference Ranges for   Androstenedione, Serum:      Follicular Phase:      35-250 ng/dL    Luteal Phase:          30-235 ng/dL    Postmenopausal Phase:  20-75  ng/dL   This test was developed and its analytical performance characteristics have been determined by [facility]. It has not been cleared or approved by FDA. This assay has been validated pursuant to the CLIA regulations and is used for clinical purposes.   Marland Kitchen. [facility] 10/02/2016 6.3  mIU/ML Final   Female Reference Range:  1.4-18.1 mIU/mLFemale Reference Range:Follicular Phase          2.5-10.2 mIU/mLMidCycle Peak          3.4-33.4 mIU/mLLuteal Phase          1.5-9.1 mIU/mLPost Menopausal     23.0-116.3 mIU/mLPregnant          <0.3 mIU/mL  . LH 10/02/2016 18.94  mIU/mL Final   Comment: Female Reference Range:20-70 yrs     1.5-9.3 mIU/mL>70 yrs       3.1-35.6 mIU/mLFemale Reference Range:Follicular Phase     1.9-12.5 mIU/mLMidcycle             8.7-76.3 mIU/mLLuteal Phase         0.5-16.9 mIU/mL  Post Menopausal      15.9-54.0  mIU/mLPregnant             <1.5 mIU/mLContraceptives       0.7-5.6 mIU/mL   . Testosterone 10/02/2016 40  8 - 48 ng/dL Final  . Testosterone, Free  10/02/2016 1.6  0.0 - 4.2 pg/mL Final  . Sex Hormone Binding 10/02/2016 50.7  24.6 - 122.0 nmol/L Final   LH:FSH 3:1. Testosterone normal, as are the other labs. Possible mild PCOS. Will go ahead with Metformin as discussed.  Carlus Pavlov, MD PhD [facility] Endocrinology

## 2016-10-02 NOTE — Patient Instructions (Signed)
Please stop at the lab.  Depending on the results, will send a prescription of metformin to your pharmacy.  Please start Metformin 500 mg with dinner x 4 days. If you tolerate this well, add another Metformin tablet (500 mg) with breakfast x 4 days. If you tolerate this well, add another metformin tablet with dinner (total 1000 mg) x 4 days. If you tolerate this well, add another metformin tablet with breakfast (total 1000 mg). Continue with 1000 mg of metformin 2x a day with breakfast and dinner.  Look at: [email or web address]  Please come back for a follow-up appointment in 6 months.   Polycystic Ovarian Syndrome Polycystic ovarian syndrome (PCOS) is a common hormonal disorder among women of reproductive age. In most women with PCOS, many small fluid-filled sacs (cysts) grow on the ovaries, and the cysts are not part of a normal menstrual cycle. PCOS can cause problems with your menstrual periods and make it difficult to get pregnant. It can also cause an increased risk of miscarriage with pregnancy. If it is not treated, PCOS can lead to serious health problems, such as diabetes and heart disease. What are the causes? The cause of PCOS is not known, but it may be the result of a combination of certain factors, such as:  Irregular menstrual cycle.  High levels of certain hormones (androgens).  Problems with the hormone that helps to control blood sugar (insulin resistance).  Certain genes. What increases the risk? This condition is more likely to develop in women who have a family history of PCOS. What are the signs or symptoms? Symptoms of PCOS may include:  Multiple ovarian cysts.  Infrequent periods or no periods.  Periods that are too frequent or too heavy.  Unpredictable periods.  Inability to get pregnant (infertility) because of not ovulating.  Increased growth of hair on the face, chest, stomach, back, thumbs, thighs, or toes.  Acne or oily skin. Acne may develop during  adulthood, and it may not respond to treatment.  Pelvic pain.  Weight gain or obesity.  Patches of thickened and dark [place] or black skin on the neck, arms, breasts, or thighs (acanthosis nigricans).  Excess hair growth on the face, chest, abdomen, or upper thighs (hirsutism). How is this diagnosed? This condition is diagnosed based on:  Your medical history.  A physical exam, including a pelvic exam. Your health care provider may look for areas of increased hair growth on your skin.  Tests, such as:  Ultrasound. This may be used to examine the ovaries and the lining of the uterus (endometrium) for cysts.  Blood tests. These may be used to check levels of sugar (glucose), female hormone (testosterone), and female hormones (estrogen and progesterone) in your blood. How is this treated? There is no cure for PCOS, but treatment can help to manage symptoms and prevent more health problems from developing. Treatment varies depending on:  Your symptoms.  Whether you want to have a baby or whether you need birth control (contraception). Treatment may include nutrition and lifestyle changes along with:  Progesterone hormone to start a menstrual period.  Birth control pills to help you have regular menstrual periods.  Medicines to make you ovulate, if you want to get pregnant.  Medicine to reduce excessive hair growth.  Surgery, in severe cases. This may involve making small holes in one or both of your ovaries. This decreases the amount of testosterone that your body produces. Follow these instructions at home:  Take over-the-counter and prescription medicines only as  told by your health care provider.  Follow a healthy meal plan. This can help you reduce the effects of PCOS.  Eat a healthy diet that includes lean proteins, complex carbohydrates, fresh fruits and vegetables, low-fat dairy products, and healthy fats. Make sure to eat enough fiber.  If you are overweight, lose  weight as told by your health care provider.  Losing 10% of your body weight may improve symptoms.  Your health care provider can determine how much weight loss is best for you and can help you lose weight safely.  Keep all follow-up visits as told by your health care provider. This is important. Contact a health care provider if:  Your symptoms do not get better with medicine.  You develop new symptoms. This information is not intended to replace advice given to you by your health care provider. Make sure you discuss any questions you have with your health care provider. Document Released: 08/24/2004 Document Revised: 12/27/2015 Document Reviewed: 10/16/2015 Elsevier Interactive Patient Education  2017 [employer]

## 2016-10-03 LAB — FOLLICLE STIMULATING HORMONE: FSH: 6.3 m[IU]/mL

## 2016-10-03 LAB — TESTOSTERONE, FREE, TOTAL, SHBG
Sex Hormone Binding: 50.7 nmol/L (ref 24.6–122.0)
Testosterone, Free: 1.6 pg/mL (ref 0.0–4.2)
Testosterone: 40 ng/dL (ref 8–48)

## 2016-10-03 LAB — LUTEINIZING HORMONE: LH: 18.94 m[IU]/mL

## 2016-10-05 LAB — ANDROSTENEDIONE: Androstenedione: 127 ng/dL

## 2016-10-05 LAB — 17-HYDROXYPROGESTERONE: 17-OH-Progesterone, LC/MS/MS: 33 ng/dL

## 2016-10-05 MED ORDER — METFORMIN HCL 500 MG PO TABS: 1000.0000 mg | ORAL_TABLET | Freq: Two times a day (BID) | ORAL | 3 refills | Status: DC

## 2016-10-09 ENCOUNTER — Encounter: Payer: Self-pay | Admitting: Family Medicine

## 2016-10-09 ENCOUNTER — Other Ambulatory Visit: Payer: Self-pay | Admitting: Family Medicine

## 2016-10-10 MED ORDER — PRENATAL VITAMINS 28-0.8 MG PO TABS: ORAL_TABLET | ORAL | 1 refills | Status: DC

## 2016-10-12 ENCOUNTER — Other Ambulatory Visit: Payer: Self-pay

## 2016-10-12 ENCOUNTER — Ambulatory Visit (INDEPENDENT_AMBULATORY_CARE_PROVIDER_SITE_OTHER): Payer: 59 | Admitting: Family Medicine

## 2016-10-12 ENCOUNTER — Encounter: Payer: Self-pay | Admitting: Family Medicine

## 2016-10-12 VITALS — BP 118/62 | HR 105 | Temp 98.4°F | Resp 14 | Wt 299.1 lb

## 2016-10-12 DIAGNOSIS — N926 Irregular menstruation, unspecified: Secondary | ICD-10-CM

## 2016-10-12 DIAGNOSIS — R454 Irritability and anger: Secondary | ICD-10-CM

## 2016-10-12 DIAGNOSIS — F5081 Binge eating disorder: Secondary | ICD-10-CM

## 2016-10-12 DIAGNOSIS — Z5181 Encounter for therapeutic drug level monitoring: Secondary | ICD-10-CM | POA: Insufficient documentation

## 2016-10-12 DIAGNOSIS — R011 Cardiac murmur, unspecified: Secondary | ICD-10-CM | POA: Insufficient documentation

## 2016-10-12 DIAGNOSIS — F331 Major depressive disorder, recurrent, moderate: Secondary | ICD-10-CM | POA: Diagnosis not present

## 2016-10-12 DIAGNOSIS — E282 Polycystic ovarian syndrome: Secondary | ICD-10-CM | POA: Diagnosis not present

## 2016-10-12 DIAGNOSIS — Z23 Encounter for immunization: Secondary | ICD-10-CM

## 2016-10-12 LAB — POCT URINE PREGNANCY: Preg Test, Ur: NEGATIVE

## 2016-10-12 MED ORDER — FLUOXETINE HCL 20 MG PO TABS: 60.0000 mg | ORAL_TABLET | Freq: Every day | ORAL | 0 refills | Status: DC

## 2016-10-12 NOTE — Progress Notes (Signed)
BP 118/62   Pulse (!) 105   Temp 98.4 F (36.9 C) (Oral)   Resp 14   Wt 299 lb 1.6 oz (135.7 kg)   LMP 09/03/2016   SpO2 99%   BMI 52.98 kg/m    Subjective:    Patient ID: Katherine Huynh, female    DOB: 03/29/1996, 21 y.o.   MRN: [id number]  HPI: Katherine Huynh is a 21 y.o. female  Chief Complaint  Patient presents with  . Medication Refill    HPI Patient is here for a medicine refill of her antidepressant She sent a MyChart message earlier this week asking for a refill of 80 mg of prozac daily; that's not how it was prescribed, and she was referred to psych months ago I asked what happened, why she isn't seeing psychiatrist and how did she end up on 80 mg of prozac daily She took 40 mg of the SSRI for about 20 days, then doubled it on her own to 80 mg daily Was contacted by psych but she was driving and did not call back She is good right now she says, but does want to still see the psychiatrist No thoughts of self-harm or hruting others Has restless sleep, sleep is fragmented Having nightmares on a regular basis Eating regularly now; no binge eating Not working with a counselor any more; stopped seeing him before SSRI; just a time issue; she started working; her job is intense; collections agent  She is on metformin prescribed by endocrinologist; a little nausea with that as she was told to expect; she has PCOS; she asked about possibility of getting pregnant on metformin (I explained it may increase fertility in women who have had difficult getting pregnant with PCOS); she says the endocrinologist found a heart murmur but no work-up done  [facility] 10/12/2016 04/17/2016 03/06/2016 02/17/2016 09/15/2015  Decreased Interest 0 1 1 3  0  Down, Depressed, Hopeless 0 1 1 3  0  PHQ - 2 Score 0 2 2 6  0  Altered sleeping - 3 1 3  -  Tired, decreased energy - 3 1 3  -  Change in appetite - 3 1 3  -  Feeling bad or failure about yourself  - 0 1 3 -  Trouble concentrating - 0 0  3 -  Moving slowly or fidgety/restless - 0 0 0 -  Suicidal thoughts - 0 0 0 -  PHQ-9 Score - 11 6 21  -  Difficult doing work/chores - Somewhat difficult Not difficult at all Not difficult at all -    Relevant past medical, surgical, family and social history reviewed Past Medical History:  Diagnosis Date  . Depression   . Fam hx-ischem heart disease 09/15/2015  . Heart murmur   . Morbid obesity (HCC) 02/17/2016  . Polycystic disease, ovaries    Past Surgical History:  Procedure Laterality Date  . NO PAST SURGERIES     Family History  Problem Relation Age of Onset  . Cancer Maternal Grandmother        breast  . Heart disease Maternal Grandfather   . Heart disease Paternal Grandfather    Social History   Social History  . Marital status: Single    Spouse name: N/A  . Number of children: N/A  . Years of education: N/A   Occupational History  . Not on file.   Social History Main Topics  . Smoking status: Current Some Day Smoker    Types: Cigarettes  . Smokeless tobacco: Never  Used  . Alcohol use Yes     Comment: occas  . Drug use: No  . Sexual activity: Yes    Birth control/ protection: None   Other Topics Concern  . Not on file   Social History Narrative  . No narrative on file   Interim medical history since last visit reviewed. Allergies and medications reviewed  Review of Systems Per HPI unless specifically indicated above     Objective:    BP 118/62   Pulse (!) 105   Temp 98.4 F (36.9 C) (Oral)   Resp 14   Wt 299 lb 1.6 oz (135.7 kg)   LMP 09/03/2016   SpO2 99%   BMI 52.98 kg/m   Wt Readings from Last 3 Encounters:  10/12/16 299 lb 1.6 oz (135.7 kg)  10/02/16 296 lb (134.3 kg)  07/11/16 295 lb (133.8 kg)    Physical Exam  Constitutional: She appears well-developed and well-nourished. No distress.  Morbidly obese  Eyes: EOM are normal. No scleral icterus.  Neck: No thyromegaly present.  Cardiovascular: Regular rhythm.   Borderline  tachycardic rate  Pulmonary/Chest: Effort normal and breath sounds normal.  Abdominal: She exhibits no distension.  Morbidly obese  Skin: No pallor.  Psychiatric: She has a normal mood and affect. Her behavior is normal. Judgment and thought content normal. Her mood appears not anxious. Her speech is not delayed and not tangential. She is not agitated, not hyperactive and not slowed. She does not express impulsivity or inappropriate judgment. She does not exhibit a depressed mood. She expresses no homicidal and no suicidal ideation.  Fair eye contact with examiner; not tearful; not despondent   Results for orders placed or performed in visit on 10/12/16  POCT urine pregnancy  Result Value Ref Range   Preg Test, Ur Negative Negative  shown to patient     Assessment & Plan:   Problem List Items Addressed This Visit      Endocrine   Polycystic disease, ovaries (Chronic)    On metformin per endocrinologist; explained that metformin may increase fertility and to be aware if wanting to plan family        Other   Murmur, cardiac    Refer for echo, likely benign murmur; check      Relevant Orders   ECHOCARDIOGRAM COMPLETE   Morbid obesity (HCC)    Seeing endocrinologist, just started metformin      Relevant Medications   metFORMIN (GLUCOPHAGE) 500 MG tablet   Medication monitoring encounter    Check CMP today      Difficulty controlling anger   Relevant Orders   Ambulatory referral to Psychiatry   Depression, major, recurrent (HCC) - Primary   Relevant Medications   FLUoxetine (PROZAC) 20 MG tablet   Other Relevant Orders   Ambulatory referral to Psychiatry   Binge eating disorder    In remission on SSRI      Relevant Orders   Ambulatory referral to Psychiatry    Other Visit Diagnoses    Need for Tdap vaccination       Relevant Orders   Tdap vaccine greater than or equal to 7yo IM (Completed)   Missed period       Relevant Orders   POCT urine pregnancy  (Completed)       Follow up plan: No Follow-up on file.  An after-visit summary was printed and given to the patient at check-out.  Please see the patient instructions which may contain other information and recommendations  beyond what is mentioned above in the assessment and plan.  Meds ordered this encounter  Medications  . metFORMIN (GLUCOPHAGE) 500 MG tablet    Sig: Take 500 mg by mouth.  . DISCONTD: FLUoxetine (PROZAC) 40 MG capsule    Sig: Take 40 mg by mouth 2 (two) times daily.  Marland Kitchen FLUoxetine (PROZAC) 20 MG tablet    Sig: Take 3 tablets (60 mg total) by mouth daily.    Dispense:  90 tablet    Refill:  0    Orders Placed This Encounter  Procedures  . Tdap vaccine greater than or equal to 7yo IM  . Ambulatory referral to Psychiatry  . POCT urine pregnancy  . ECHOCARDIOGRAM COMPLETE

## 2016-10-12 NOTE — Assessment & Plan Note (Signed)
Refer for echo, likely benign murmur; check

## 2016-10-12 NOTE — Patient Instructions (Signed)
Let's get labs today I've put in a referral for you to see the psychiatrist Decrease medicine from 80 mg daily to 60 mg daily Call with any problems If you have not heard anything from my staff in a week about any orders/referrals/studies from today, please contact us here to follow-up [phone]

## 2016-10-12 NOTE — Assessment & Plan Note (Signed)
Check CMP today 

## 2016-10-12 NOTE — Assessment & Plan Note (Signed)
In remission on SSRI

## 2016-10-13 LAB — COMPREHENSIVE METABOLIC PANEL
ALT: 16 IU/L (ref 0–32)
AST: 18 IU/L (ref 0–40)
Albumin/Globulin Ratio: 1.3 (ref 1.2–2.2)
Albumin: 4.4 g/dL (ref 3.5–5.5)
Alkaline Phosphatase: 106 IU/L (ref 39–117)
BUN/Creatinine Ratio: 11 (ref 9–23)
BUN: 10 mg/dL (ref 6–20)
Bilirubin Total: <0.2 mg/dL (ref 0.0–1.2)
CO2: 19 mmol/L (ref 18–29)
Calcium: 9.5 mg/dL (ref 8.7–10.2)
Chloride: 102 mmol/L (ref 96–106)
Creatinine, Ser: 0.88 mg/dL (ref 0.57–1.00)
GFR calc Af Amer: 109 mL/min/{1.73_m2} (ref >59)
GFR calc non Af Amer: 94 mL/min/{1.73_m2} (ref >59)
Globulin, Total: 3.4 g/dL (ref 1.5–4.5)
Glucose: 85 mg/dL (ref 65–99)
Potassium: 4 mmol/L (ref 3.5–5.2)
Sodium: 136 mmol/L (ref 134–144)
Total Protein: 7.8 g/dL (ref 6.0–8.5)

## 2016-10-13 NOTE — Assessment & Plan Note (Signed)
Seeing endocrinologist, just started metformin

## 2016-10-13 NOTE — Assessment & Plan Note (Signed)
On metformin per endocrinologist; explained that metformin may increase fertility and to be aware if wanting to plan family

## 2016-10-18 ENCOUNTER — Ambulatory Visit (INDEPENDENT_AMBULATORY_CARE_PROVIDER_SITE_OTHER): Payer: 59 | Admitting: Family Medicine

## 2016-10-18 ENCOUNTER — Encounter: Payer: Self-pay | Admitting: Family Medicine

## 2016-10-18 VITALS — BP 120/80 | HR 96 | Temp 98.3°F | Resp 16 | Ht 63.0 in | Wt 294.2 lb

## 2016-10-18 DIAGNOSIS — R319 Hematuria, unspecified: Secondary | ICD-10-CM | POA: Diagnosis not present

## 2016-10-18 DIAGNOSIS — R8281 Pyuria: Secondary | ICD-10-CM

## 2016-10-18 DIAGNOSIS — R112 Nausea with vomiting, unspecified: Secondary | ICD-10-CM

## 2016-10-18 DIAGNOSIS — K521 Toxic gastroenteritis and colitis: Secondary | ICD-10-CM | POA: Diagnosis not present

## 2016-10-18 DIAGNOSIS — R55 Syncope and collapse: Secondary | ICD-10-CM

## 2016-10-18 DIAGNOSIS — E282 Polycystic ovarian syndrome: Secondary | ICD-10-CM

## 2016-10-18 DIAGNOSIS — N390 Urinary tract infection, site not specified: Secondary | ICD-10-CM | POA: Diagnosis not present

## 2016-10-18 LAB — POCT URINALYSIS DIPSTICK
Bilirubin, UA: NEGATIVE
Glucose, UA: NEGATIVE
Ketones, UA: NEGATIVE
Nitrite, UA: NEGATIVE
Spec Grav, UA: 1.025 (ref 1.010–1.025)
Urobilinogen, UA: 0.2 E.U./dL
pH, UA: 5 (ref 5.0–8.0)

## 2016-10-18 LAB — GLUCOSE, POCT (MANUAL RESULT ENTRY): POC Glucose: 85 mg/dl (ref 70–99)

## 2016-10-18 NOTE — Progress Notes (Addendum)
Name: Katherine Huynh   MRN: [id number]    DOB: 09-Jan-1996   Date:10/18/2016       Progress Note  Subjective  Chief Complaint  Chief Complaint  Patient presents with  . Medication Reaction    metformin    HPI  Pt present with 1 week of low abdominal cramping that is better after BM's, intermittent headaches; notes she "blacked out" for 30 minutes - states co-worker told her she was "in a trance" and wouldn't respond to the co-worker at all during this episode. 1 episode of "deep red" colored emesis earlier this morning, consistency was mucous-like.  States always has nausea and sometimes vomits when it gets really bad; also endorses diarrhea x1 week.  No fevers/chills, chest pain, shortness of breath, constipation, no loss of appetite but has recently cut out sugar and is eating more fruits and vegetables.  She notes she has not been counting calories but has significantly restricted her diet.  Pt started Metformin on 10/05/2016 and has titrated up - she is currently taking 500mg  BID. Pt has PCOS and is trying to conceive.  She also recently decreased her Zoloft dose to 60mg  from 80mg  and has been doing well on this dose - has not missed any doses recently.   LMP: several months ago (Has PCOS and this is baseline for pt). Is not seeing a fertility specialist at this time, sees endocrinology. Negative Urine pregnancy test on 10/12/2016.  Patient Active Problem List   Diagnosis Date Noted  . Medication monitoring encounter 10/12/2016  . Murmur, cardiac 10/12/2016  . Polycystic disease, ovaries 10/02/2016  . 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 Morbid obesity (HCC) 02/17/2016  . Difficulty controlling anger 02/17/2016  . Binge eating disorder 02/17/2016  . Athlete's foot, left 10/10/2015  . Personal history of sulfonamide allergy 09/15/2015  . Onychomycosis 09/15/2015  . Fam hx-ischem heart disease 09/15/2015    Social History  Substance Use Topics  . Smoking status: Current Some  Day Smoker    Types: Cigarettes  . Smokeless tobacco: Never Used  . Alcohol use Yes     Comment: occas     Current Outpatient Prescriptions:  .  FLUoxetine (PROZAC) 20 MG tablet, Take 3 tablets (60 mg total) by mouth daily., Disp: 90 tablet, Rfl: 0 .  loratadine (CLARITIN) 10 MG tablet, Take 1 tablet (10 mg total) by mouth daily as needed for allergies or itching., Disp: 30 tablet, Rfl: 11 .  metFORMIN (GLUCOPHAGE) 500 MG tablet, Take 500 mg by mouth., Disp: , Rfl:  .  Prenatal Vit-Fe Fumarate-FA (PRENATAL VITAMINS) 28-0.8 MG TABS, Take one by mouth daily, Disp: 30 tablet, Rfl: 1  Allergies  Allergen Reactions  . Cinnamon Itching  . Erythromycin Other (See Comments)    Unknown reaction, happened in childhood  . Sulfa Antibiotics Rash    ROS  Ten systems reviewed and is negative except as mentioned in HPI  Objective  Vitals:   10/18/16 1047  BP: 120/80  Pulse: 96  Resp: 16  Temp: 98.3 F (36.8 C)  TempSrc: Oral  SpO2: 97%  Weight: 294 lb 3.2 oz (133.4 kg)  Height: 5\' 3"  (1.6 m)    Body mass index is 52.12 kg/m.  Nursing Note and Vital Signs reviewed.  Physical Exam  Constitutional: Patient appears well-developed and well-nourished. Morbidly Obese No distress.  HEENT: head atraumatic, normocephalic Cardiovascular: Normal rate, regular rhythm, S1/S2 present. No rub heard. No BLE edema. Pulmonary/Chest: Effort normal and breath sounds clear. No respiratory  distress or retractions. Abdominal: Soft and non-tender obese abdomen, bowel sounds present x4 quadrants. Psychiatric: Patient has a normal mood and affect is flat. behavior is normal. Judgment and thought content normal. Neurological: she is alert and oriented to person, place, and time. No cranial nerve deficit. Coordination, balance, strength, speech and gait are normal.   Results for orders placed or performed in visit on 10/18/16 (from the past 72 hour(s))  POCT urinalysis dipstick     Status: Abnormal    Collection Time: 10/18/16 11:35 AM  Result Value Ref Range   Color, UA gold    Clarity, UA clear    Glucose, UA negative    Bilirubin, UA negative    Ketones, UA negative    Spec Grav, UA 1.025 1.010 - 1.025   Blood, UA large    pH, UA 5.0 5.0 - 8.0   Protein, UA trace    Urobilinogen, UA 0.2 0.2 or 1.0 E.U./dL   Nitrite, UA negative    Leukocytes, UA Trace (A) Negative  POCT glucose (manual entry)     Status: None   Collection Time: 10/18/16 11:35 AM  Result Value Ref Range   POC Glucose 85 70 - 99 mg/dl    Assessment & Plan 1. Diarrhea due to drug - Advised diarrhea and abdominal cramping are likely due to Metformin use. - Discussed taking Metformin with food and how changing eating habits can affect BM's too.  - Advised to call her endocrinologist if she decides she wants to adjust her dosing.  2. Near syncope - POCT urinalysis dipstick - POCT glucose (manual entry) - 85 today in office and patient is informed of this result.  3. Polycystic disease, ovaries - POCT glucose (manual entry) - Amb ref to Medical Nutrition Therapy-MNT  4. Morbid obesity (HCC) - POCT glucose (manual entry) - Amb ref to Medical Nutrition Therapy-MNT  5. Pyuria - Urine Culture - discussed results of urine dip today, but advised we will await culture before treating for UTI due to lack of symptoms, and only trace leukocytes present.  6. Hematuria, unspecified type - Urinalysis, microscopic only  7. Non-intractable vomiting with nausea, unspecified vomiting type - Discussed eating habits and nausea at length with patient. She is advised that if vomitus appears dark red/bloody in a second episode, she needs to return to clinic for further evaluation.  -Will call patient with urine results once available. Discussed red flags as below. -Red flags and when to present for emergency care or RTC including fever >101.83F, chest pain, shortness of breath, new/worsening/un-resolving symptoms, pelvic  pain, back pain, change in mental status or syncope or vomiting reviewed with patient at time of visit. Follow up and care instructions discussed and provided in AVS. ---------------------------- I have reviewed this encounter including the documentation in this note and/or discussed this patient with the Deboraha Sprangprovider,Emily Boyce, FNP, NP-C. I am certifying that I agree with the content of this note as supervising physician.  Baruch GoutyMelinda Lada, MD [facility] 10/18/2016, 1:28 PM

## 2016-10-18 NOTE — Patient Instructions (Addendum)
Heart-Healthy Eating Plan Heart-healthy meal planning includes:  Limiting unhealthy fats.  Increasing healthy fats.  Making other small dietary changes.  You may need to talk with your doctor or a diet specialist (dietitian) to create an eating plan that is right for you. What types of fat should I choose?  Choose healthy fats. These include olive oil and canola oil, flaxseeds, walnuts, almonds, and seeds.  Eat more omega-3 fats. These include salmon, mackerel, sardines, tuna, flaxseed oil, and ground flaxseeds. Try to eat fish at least twice each week.  Limit saturated fats. ? Saturated fats are often found in animal products, such as meats, butter, and cream. ? Plant sources of saturated fats include palm oil, palm kernel oil, and coconut oil.  Avoid foods with partially hydrogenated oils in them. These include stick margarine, some tub margarines, cookies, crackers, and other baked goods. These contain trans fats. What general guidelines do I need to follow?  Check food labels carefully. Identify foods with trans fats or high amounts of saturated fat.  Fill one half of your plate with vegetables and green salads. Eat 4-5 servings of vegetables per day. A serving of vegetables is: ? 1 cup of raw leafy vegetables. ?  cup of raw or cooked cut-up vegetables. ?  cup of vegetable juice.  Fill one fourth of your plate with whole grains. Look for the word "whole" as the first word in the ingredient list.  Fill one fourth of your plate with lean protein foods.  Eat 4-5 servings of fruit per day. A serving of fruit is: ? One medium whole fruit. ?  cup of dried fruit. ?  cup of fresh, frozen, or canned fruit. ?  cup of 100% fruit juice.  Eat more foods that contain soluble fiber. These include apples, broccoli, carrots, beans, peas, and barley. Try to get 20-30 g of fiber per day.  Eat more home-cooked food. Eat less restaurant, buffet, and fast food.  Limit or avoid  alcohol.  Limit foods high in starch and sugar.  Avoid fried foods.  Avoid frying your food. Try baking, boiling, grilling, or broiling it instead. You can also reduce fat by: ? Removing the skin from poultry. ? Removing all visible fats from meats. ? Skimming the fat off of stews, soups, and gravies before serving them. ? Steaming vegetables in water or broth.  Lose weight if you are overweight.  Eat 4-5 servings of nuts, legumes, and seeds per week: ? One serving of dried beans or legumes equals  cup after being cooked. ? One serving of nuts equals 1 ounces. ? One serving of seeds equals  ounce or one tablespoon.  You may need to keep track of how much salt or sodium you eat. This is especially true if you have high blood pressure. Talk with your doctor or dietitian to get more information. What foods can I eat? Grains Breads, including French, white, pita, wheat, raisin, rye, oatmeal, and Italian. Tortillas that are neither fried nor made with lard or trans fat. Low-fat rolls, including hotdog and hamburger buns and English muffins. Biscuits. Muffins. Waffles. Pancakes. Light popcorn. Whole-grain cereals. Flatbread. Melba toast. Pretzels. Breadsticks. Rusks. Low-fat snacks. Low-fat crackers, including oyster, saltine, matzo, graham, animal, and rye. Rice and pasta, including [place] rice and pastas that are made with whole wheat. Vegetables All vegetables. Fruits All fruits, but limit coconut. Meats and Other Protein Sources Lean, well-trimmed beef, veal, pork, and lamb. Chicken and turkey without skin. All fish and shellfish.   Wild duck, rabbit, pheasant, and venison. Egg whites or low-cholesterol egg substitutes. Dried beans, peas, lentils, and tofu. Seeds and most nuts. Dairy Low-fat or nonfat cheeses, including ricotta, string, and mozzarella. Skim or 1% milk that is liquid, powdered, or evaporated. Buttermilk that is made with low-fat milk. Nonfat or low-fat  yogurt. Beverages Mineral water. Diet carbonated beverages. Sweets and Desserts Sherbets and fruit ices. Honey, jam, marmalade, jelly, and syrups. Meringues and gelatins. Pure sugar candy, such as hard candy, jelly beans, gumdrops, mints, marshmallows, and small amounts of dark chocolate. [employer]. Eat all sweets and desserts in moderation. Fats and Oils Nonhydrogenated (trans-free) margarines. Vegetable oils, including soybean, sesame, sunflower, olive, peanut, safflower, corn, canola, and cottonseed. Salad dressings or mayonnaise made with a vegetable oil. Limit added fats and oils that you use for cooking, baking, salads, and as spreads. Other Cocoa powder. Coffee and tea. All seasonings and condiments. The items listed above may not be a complete list of recommended foods or beverages. Contact your dietitian for more options. What foods are not recommended? Grains Breads that are made with saturated or trans fats, oils, or whole milk. Croissants. Butter rolls. Cheese breads. Sweet rolls. Donuts. Buttered popcorn. Chow mein noodles. High-fat crackers, such as cheese or butter crackers. Meats and Other Protein Sources Fatty meats, such as hotdogs, short ribs, sausage, spareribs, bacon, rib eye roast or steak, and mutton. High-fat deli meats, such as salami and bologna. Caviar. Domestic duck and goose. Organ meats, such as kidney, liver, sweetbreads, and heart. Dairy Cream, sour cream, cream cheese, and creamed cottage cheese. Whole-milk cheeses, including blue (bleu), [address], [place], [place], [address], [place], [address], cheddar, [place], and [place]. Whole or 2% milk that is liquid, evaporated, or condensed. Whole buttermilk. Cream sauce or high-fat cheese sauce. Yogurt that is made from whole milk. Beverages Regular sodas and juice drinks with added sugar. Sweets and Desserts Frosting. Pudding. Cookies. Cakes other than angel food cake. Candy that has milk chocolate or white  chocolate, hydrogenated fat, butter, coconut, or unknown ingredients. Buttered syrups. Full-fat ice cream or ice cream drinks. Fats and Oils Gravy that has suet, meat fat, or shortening. Cocoa butter, hydrogenated oils, palm oil, coconut oil, palm kernel oil. These can often be found in baked products, candy, fried foods, nondairy creamers, and whipped toppings. Solid fats and shortenings, including bacon fat, salt pork, lard, and butter. Nondairy cream substitutes, such as coffee creamers and sour cream substitutes. Salad dressings that are made of unknown oils, cheese, or sour cream. The items listed above may not be a complete list of foods and beverages to avoid. Contact your dietitian for more information. This information is not intended to replace advice given to you by your health care provider. Make sure you discuss any questions you have with your health care provider. Document Released: 10/30/2011 Document Revised: 10/06/2015 Document Reviewed: 10/22/2013 Elsevier Interactive Patient Education  2018 [employer].  Diet for Polycystic Ovarian Syndrome Polycystic ovary syndrome (PCOS) is a disorder of the chemical messengers (hormones) that regulate menstruation. The condition causes important hormones to be out of balance. PCOS can:  Make your periods irregular or stop.  Cause cysts to develop on the ovaries.  Make it difficult to get pregnant.  Stop your body from responding to the effects of insulin (insulin resistance), which can lead to obesity and diabetes.  Changing what you eat can help manage PCOS and improve your health. It can help you lose weight and improve the way your body  uses insulin. What is my plan?  Eat breakfast, lunch, and dinner plus two snacks every day.  Include protein in each meal and snack.  Choose whole grains instead of products made with refined flour.  Eat a variety of foods.  Exercise regularly as told by your health care provider. What do I  need to know about this eating plan? If you are overweight or obese, pay attention to how many calories you eat. Cutting down on calories can help you lose weight. Work with your health care provider or dietitian to figure out how many calories you need each day. What foods can I eat? Grains Whole grains, such as whole wheat. Whole-grain breads, crackers, cereals, and pasta. Unsweetened oatmeal, bulgur, barley, quinoa, or [place] rice. Corn or whole-wheat flour tortillas. Vegetables  Lettuce. Spinach. Peas. Beets. Cauliflower. Cabbage. Broccoli. Carrots. Tomatoes. Squash. Eggplant. Herbs. Peppers. Onions. Cucumbers. Brussels sprouts. Fruits Berries. Bananas. Apples. Oranges. Grapes. Papaya. Mango. Pomegranate. Kiwi. Grapefruit. Cherries. Meats and Other Protein Sources Lean proteins, such as fish, chicken, beans, eggs, and tofu. Dairy Low-fat dairy products, such as skim milk, cheese sticks, and yogurt. Beverages Low-fat or fat-free drinks, such as water, low-fat milk, sugar-free drinks, and 100% fruit juice. Condiments Ketchup. Mustard. Barbecue sauce. Relish. Low-fat or fat-free mayonnaise. Fats and Oils Olive oil or canola oil. Walnuts and almonds. The items listed above may not be a complete list of recommended foods or beverages. Contact your dietitian for more options. What foods are not recommended? Foods high in calories or fat. Fried foods. Sweets. Products made from refined white flour, including white bread, pastries, white rice, and pasta. The items listed above may not be a complete list of foods and beverages to avoid. Contact your dietitian for more information. This information is not intended to replace advice given to you by your health care provider. Make sure you discuss any questions you have with your health care provider. Document Released: 08/22/2015 Document Revised: 10/06/2015 Document Reviewed: 05/12/2014 Elsevier Interactive Patient Education  2018 [employer]

## 2016-10-19 LAB — URINALYSIS, MICROSCOPIC ONLY
Bacteria, UA: NONE SEEN [HPF]
Casts: NONE SEEN [LPF]
RBC / HPF: NONE SEEN RBC/HPF (ref <=2)
Yeast: NONE SEEN [HPF]

## 2016-10-20 LAB — URINE CULTURE

## 2016-10-22 ENCOUNTER — Encounter: Payer: Self-pay | Admitting: Family Medicine

## 2016-10-22 ENCOUNTER — Other Ambulatory Visit: Payer: Self-pay

## 2016-10-22 ENCOUNTER — Other Ambulatory Visit: Payer: Self-pay | Admitting: Family Medicine

## 2016-10-22 ENCOUNTER — Ambulatory Visit (INDEPENDENT_AMBULATORY_CARE_PROVIDER_SITE_OTHER): Payer: 59 | Admitting: Family Medicine

## 2016-10-22 DIAGNOSIS — Z3042 Encounter for surveillance of injectable contraceptive: Secondary | ICD-10-CM | POA: Diagnosis not present

## 2016-10-22 DIAGNOSIS — R454 Irritability and anger: Secondary | ICD-10-CM

## 2016-10-22 DIAGNOSIS — F330 Major depressive disorder, recurrent, mild: Secondary | ICD-10-CM

## 2016-10-22 DIAGNOSIS — Z3009 Encounter for other general counseling and advice on contraception: Secondary | ICD-10-CM | POA: Diagnosis not present

## 2016-10-22 DIAGNOSIS — R8281 Pyuria: Secondary | ICD-10-CM

## 2016-10-22 DIAGNOSIS — E282 Polycystic ovarian syndrome: Secondary | ICD-10-CM | POA: Diagnosis not present

## 2016-10-22 DIAGNOSIS — R011 Cardiac murmur, unspecified: Secondary | ICD-10-CM

## 2016-10-22 LAB — POCT URINE PREGNANCY: Preg Test, Ur: NEGATIVE

## 2016-10-22 MED ORDER — NORGESTIM-ETH ESTRAD TRIPHASIC 0.18/0.215/0.25 MG-25 MCG PO TABS: 1.0000 | ORAL_TABLET | Freq: Every day | ORAL | 1 refills | Status: DC

## 2016-10-22 NOTE — Assessment & Plan Note (Signed)
Discussed treatment with depo; patient to see Melody for pap in the next 2 months; return in 13 weeks

## 2016-10-22 NOTE — Patient Instructions (Addendum)
Please do try to eat a healthy and balanced diet Please schedule a pap smear with Melody in the next couple of months Please talk with Melody about contraceptive methods with your PCOS  I do encourage you to quit smoking Call [phone] to sign up for smoking cessation classes You can call 1-800-QUIT-NOW to talk with a smoking cessation coach  Health Risks of Smoking Smoking cigarettes is very bad for your health. Tobacco smoke has over 200 known poisons in it. It contains the poisonous gases nitrogen oxide and carbon monoxide. There are over 60 chemicals in tobacco smoke that cause cancer. Smoking is difficult to quit because a chemical in tobacco, called nicotine, causes addiction or dependence. When you smoke and inhale, nicotine is absorbed rapidly into the bloodstream through your lungs. Both inhaled and non-inhaled nicotine may be addictive. What are the risks of cigarette smoke? Cigarette smokers have an increased risk of many serious medical problems, including:  Lung cancer.  Lung disease, such as pneumonia, bronchitis, and emphysema.  Chest pain (angina) and heart attack because the heart is not getting enough oxygen.  Heart disease and peripheral blood vessel disease.  High blood pressure (hypertension).  Stroke.  Oral cancer, including cancer of the lip, mouth, or voice box.  Bladder cancer.  Pancreatic cancer.  Cervical cancer.  Pregnancy complications, including premature birth.  Stillbirths and smaller newborn babies, birth defects, and genetic damage to sperm.  Early menopause.  Lower estrogen level for women.  Infertility.  Facial wrinkles.  Blindness.  Increased risk of broken bones (fractures).  Senile dementia.  Stomach ulcers and internal bleeding.  Delayed wound healing and increased risk of complications during surgery.  Even smoking lightly shortens your life expectancy by several years.  Because of secondhand smoke exposure,  children of smokers have an increased risk of the following:  Sudden infant death syndrome (SIDS).  Respiratory infections.  Lung cancer.  Heart disease.  Ear infections.  What are the benefits of quitting? There are many health benefits of quitting smoking. Here are some of them:  Within days of quitting smoking, your risk of having a heart attack decreases, your blood flow improves, and your lung capacity improves. Blood pressure, pulse rate, and breathing patterns start returning to normal soon after quitting.  Within months, your lungs may clear up completely.  Quitting for 10 years reduces your risk of developing lung cancer and heart disease to almost that of a nonsmoker.  People who quit may see an improvement in their overall quality of life.  How do I quit smoking? Smoking is an addiction with both physical and psychological effects, and longtime habits can be hard to change. Your health care provider can recommend:  Programs and community resources, which may include group support, education, or talk therapy.  Prescription medicines to help reduce cravings.  Nicotine replacement products, such as patches, gum, and nasal sprays. Use these products only as directed. Do not replace cigarette smoking with electronic cigarettes, which are commonly called e-cigarettes. The safety of e-cigarettes is not known, and some may contain harmful chemicals.  A combination of two or more of these methods.  Where to find more information:  American Lung Association: www.lung.org  American Cancer Society: www.cancer.org Summary  Smoking cigarettes is very bad for your health. Cigarette smokers have an increased risk of many serious medical problems, including several cancers, heart disease, and stroke.  Smoking is an addiction with both physical and psychological effects, and longtime habits can be hard to  change.  By stopping right away, you can greatly reduce the risk of  medical problems for you and your family.  To help you quit smoking, your health care provider can recommend programs, community resources, prescription medicines, and nicotine replacement products such as patches, gum, and nasal sprays. This information is not intended to replace advice given to you by your health care provider. Make sure you discuss any questions you have with your health care provider. Document Released: 06/07/2004 Document Revised: 05/04/2016 Document Reviewed: 05/04/2016 Elsevier Interactive Patient Education  2017 [employer]

## 2016-10-22 NOTE — Assessment & Plan Note (Signed)
Echocardiogram was ordered at last visit; will have staff check on the status of this order

## 2016-10-22 NOTE — Assessment & Plan Note (Addendum)
At first, patient did not want Depo and wanted OCPs; then she changed her mind and wanted Depo instead; she sees endo and GYN and has PCOS; she does not sound like she desires pregnancy in the near future, though previous visit she was on prenatal vitamins and asking about metformin increasing chance of pregnancy; urine hCG here was negative; I think an OCP like Yasmin would treat her PCOS but want to discuss this with Melody, her GYN provider; it would be better if her GYN managed her contraception, as I experienced some vacillation today even during the visit as to her wishes and desires; she is in need of a pap smear and I wrote for her to have one with Melody in the next two months; I strongly encouraged her to quit smoking

## 2016-10-22 NOTE — Progress Notes (Signed)
Hello Katherine Huynh, I am writing to let you know that I have reviewed your lab results: Your urine culture did not find any concerning bacterial growht, however your Urine Micro did find some White Blood Cells (WBCs). I would like to recheck your urine 1 week from when you finish your antibiotic. I have placed the order, and ask that you come in one week after finishing your antibiotic to have that collected.  Please feel free to message me back or call the clinic with any questions you may have. Thank you so much, Maurice SmallEmily Bodey Frizell NP-C

## 2016-10-22 NOTE — Assessment & Plan Note (Signed)
She'll be seeing psychiatrist on Monday

## 2016-10-22 NOTE — Assessment & Plan Note (Signed)
Patient reports mood is much better; continue SSRI and see psychiatrist on Monday

## 2016-10-22 NOTE — Assessment & Plan Note (Signed)
Patient is seeing endocrinologist and gynecologist; patient wanting depo from me today; talked with her about OCPs; encouraged her to talk with her gynecologist; my understanding is that if she does not desire pregnancy, an OCP like Yasmin would be a better choice than Depo; she is in need of a pap smear (age 21); my staff put in a call to Melody and asked if she would return my call tomorrow

## 2016-10-22 NOTE — Progress Notes (Signed)
BP 120/64   Pulse 96   Temp 97.9 F (36.6 C) (Oral)   Resp 16   Wt 294 lb 9.6 oz (133.6 kg)   SpO2 98%   BMI 52.19 kg/m    Subjective:    Patient ID: Katherine Huynh, female    DOB: Sep 28, 1995, 21 y.o.   MRN: [id number]  HPI: Katherine Huynh is a 21 y.o. female  Chief Complaint  Patient presents with  . Contraception    wants to placed on birth control   HPI  Patient is here for f/u  Since her last visit, she was throwing up blood and saw another provider; had nausea and vomiting; was only eating 800 kcal a day; really restrictive diet; heavy duty dieting; she is completely better now; no abdominal pain, no nausea, no vomiting; eating better; not sure if metformin, which led to nausea and snowballed; eating good by herself now  She has been on birth control before, pills and Depo; she would like to go back on pills No hx of migraine with aura; no hx of DVT or PE; no hx of MI or CVA She does not want a shot, so would prefer pills She sees a gyncologist, and I asked why she did not see her gyn for this; she says that she "just had the appointment" today and decided to talk about it here Sees gyn, not desiring pregnancy right now; previous was desiring pregnancy Near the end of the visit, she decided she didn't want the birth control pills after all and wanted the Depo shot She has not had a pap smear yet  Mood is doing much better she says; sleeping okay; taking 60 mg of fluoxetine daily Appointment with psychiatrist is Monday  Drinks 2-4 glasses of alcohol a week; she smokes  Depression screen [facility] 2/9 10/12/2016 04/17/2016 03/06/2016 02/17/2016 09/15/2015  Decreased Interest 0 1 1 3  0  Down, Depressed, Hopeless 0 1 1 3  0  PHQ - 2 Score 0 2 2 6  0  Altered sleeping - 3 1 3  -  Tired, decreased energy - 3 1 3  -  Change in appetite - 3 1 3  -  Feeling bad or failure about yourself  - 0 1 3 -  Trouble concentrating - 0 0 3 -  Moving slowly or fidgety/restless - 0 0 0 -  Suicidal  thoughts - 0 0 0 -  PHQ-9 Score - 11 6 21  -  Difficult doing work/chores - Somewhat difficult Not difficult at all Not difficult at all -    Relevant past medical, surgical, family and social history reviewed Past Medical History:  Diagnosis Date  . Depression   . Fam hx-ischem heart disease 09/15/2015  . Heart murmur   . Morbid obesity (HCC) 02/17/2016  . Polycystic disease, ovaries    Past Surgical History:  Procedure Laterality Date  . NO PAST SURGERIES     Family History  Problem Relation Age of Onset  . Cancer Maternal Grandmother        breast  . Heart disease Maternal Grandfather   . Heart disease Paternal Grandfather    Social History   Social History  . Marital status: Single    Spouse name: N/A  . Number of children: N/A  . Years of education: N/A   Occupational History  . Not on file.   Social History Main Topics  . Smoking status: Current Some Day Smoker    Types: Cigarettes  . Smokeless tobacco: Never Used  .  Alcohol use Yes     Comment: occas  . Drug use: No  . Sexual activity: Yes    Birth control/ protection: None   Other Topics Concern  . Not on file   Social History Narrative  . No narrative on file   Interim medical history since last visit reviewed. Allergies and medications reviewed  Review of Systems Per HPI unless specifically indicated above     Objective:    BP 120/64   Pulse 96   Temp 97.9 F (36.6 C) (Oral)   Resp 16   Wt 294 lb 9.6 oz (133.6 kg)   SpO2 98%   BMI 52.19 kg/m   Wt Readings from Last 3 Encounters:  10/22/16 294 lb 9.6 oz (133.6 kg)  10/18/16 294 lb 3.2 oz (133.4 kg)  10/12/16 299 lb 1.6 oz (135.7 kg)    Physical Exam  Constitutional: She appears well-developed and well-nourished. No distress.  Morbidly obese  Eyes: EOM are normal. No scleral icterus.  Neck: No thyromegaly present.  Cardiovascular: Normal rate and regular rhythm.   Pulmonary/Chest: Effort normal and breath sounds normal.  Abdominal:  Soft. Normal appearance. She exhibits no distension. There is no tenderness.  Morbidly obese  Skin: No pallor.  Psychiatric: Her mood appears not anxious. Her affect is not blunt. Her speech is not delayed and not tangential. She is not agitated, not hyperactive and not slowed. She does not express impulsivity or inappropriate judgment. She does not exhibit a depressed mood. She expresses no homicidal and no suicidal ideation.  Fair eye contact with examiner; not tearful; not despondent, neatly and casually dressed, hair tied up with ribbon      Assessment & Plan:   Problem List Items Addressed This Visit      Endocrine   Polycystic disease, ovaries (Chronic)    Patient is seeing endocrinologist and gynecologist; patient wanting depo from me today; talked with her about OCPs; encouraged her to talk with her gynecologist; my understanding is that if she does not desire pregnancy, an OCP like Yasmin would be a better choice than Depo; she is in need of a pap smear (age 68); my staff put in a call to Melody and asked if she would return my call tomorrow        Other   Murmur, cardiac    Echocardiogram was ordered at last visit; will have staff check on the status of this order      General counseling and advice on contraceptive management    At first, patient did not want Depo and wanted OCPs; then she changed her mind and wanted Depo instead; she sees endo and GYN and has PCOS; she does not sound like she desires pregnancy in the near future, though previous visit she was on prenatal vitamins and asking about metformin increasing chance of pregnancy; urine hCG here was negative; I think an OCP like Yasmin would treat her PCOS but want to discuss this with Melody, her GYN provider; it would be better if her GYN managed her contraception, as I experienced some vacillation today even during the visit as to her wishes and desires; she is in need of a pap smear and I wrote for her to have one with  Melody in the next two months; I strongly encouraged her to quit smoking      Difficulty controlling anger    She'll be seeing psychiatrist on Monday      Depression, major, recurrent (HCC)    Patient  reports mood is much better; continue SSRI and see psychiatrist on Monday      RESOLVED: Depo-Provera contraceptive status    Discussed treatment with depo; patient to see Melody for pap in the next 2 months; return in 13 weeks      Relevant Orders   POCT urine pregnancy (Completed)       Follow up plan: Return in about 13 weeks (around 01/21/2017) for depo shot here or with Melody; will need pap smear by then.  An after-visit summary was printed and given to the patient at check-out.  Please see the patient instructions which may contain other information and recommendations beyond what is mentioned above in the assessment and plan.  Meds ordered this encounter  Medications  . DISCONTD: Norgestimate-Ethinyl Estradiol Triphasic (ORTHO TRI-CYCLEN LO) 0.18/0.215/0.25 MG-25 MCG tab    Sig: Take 1 tablet by mouth daily.    Dispense:  1 Package    Refill:  1    Orders Placed This Encounter  Procedures  . POCT urine pregnancy

## 2016-10-25 ENCOUNTER — Other Ambulatory Visit: Payer: Self-pay | Admitting: Family Medicine

## 2016-10-25 DIAGNOSIS — N3000 Acute cystitis without hematuria: Secondary | ICD-10-CM

## 2016-10-25 LAB — URINALYSIS, MICROSCOPIC ONLY: Casts: NONE SEEN /lpf

## 2016-10-25 LAB — SPECIMEN STATUS REPORT

## 2016-10-25 LAB — URINE CULTURE

## 2016-10-25 MED ORDER — NITROFURANTOIN MACROCRYSTAL 100 MG PO CAPS: 100.0000 mg | ORAL_CAPSULE | Freq: Two times a day (BID) | ORAL | 0 refills | Status: AC

## 2016-10-25 NOTE — Progress Notes (Signed)
Hello [name],  We re-checked your urine on 10/22/16 when you were here for your visit with Dr. Sherie DonLada, and you do in fact have a urinary tract infection that showed up this time. I am sending in an antibiotic for you to take twice daily for three days.   Please feel free to message me back or call the clinic with any questions you may have. Thank you so much, Maurice SmallEmily Boyce NP-C

## 2016-10-29 ENCOUNTER — Ambulatory Visit (INDEPENDENT_AMBULATORY_CARE_PROVIDER_SITE_OTHER): Payer: 59 | Admitting: Psychiatry

## 2016-10-29 ENCOUNTER — Encounter: Payer: Self-pay | Admitting: Psychiatry

## 2016-10-29 VITALS — BP 139/89 | HR 92 | Temp 99.0°F | Wt 293.8 lb

## 2016-10-29 DIAGNOSIS — F411 Generalized anxiety disorder: Secondary | ICD-10-CM

## 2016-10-29 DIAGNOSIS — F331 Major depressive disorder, recurrent, moderate: Secondary | ICD-10-CM | POA: Diagnosis not present

## 2016-10-29 MED ORDER — TRAZODONE HCL 50 MG PO TABS
50.0000 mg | ORAL_TABLET | Freq: Every day | ORAL | 1 refills | Status: DC
Start: 2016-10-29 — End: 2017-07-09

## 2016-10-29 MED ORDER — FLUOXETINE HCL 20 MG PO TABS
40.0000 mg | ORAL_TABLET | Freq: Every day | ORAL | 0 refills | Status: DC
Start: 2016-10-29 — End: 2016-12-25
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
## 2016-10-30 ENCOUNTER — Ambulatory Visit
Admission: RE | Admit: 2016-10-30 | Discharge: 2016-10-30 | Disposition: A | Discharge status: Home / Self Care | Payer: 59 | Source: Ambulatory Visit | Attending: Family Medicine | Admitting: Family Medicine

## 2016-10-30 DIAGNOSIS — R011 Cardiac murmur, unspecified: Secondary | ICD-10-CM

## 2016-10-30 NOTE — Progress Notes (Signed)
*  PRELIMINARY RESULTS* Echocardiogram 2D Echocardiogram has been performed.  Cristela BlueHege, Taleeyah Bora 10/30/2016, 12:04 PM

## 2016-10-31 ENCOUNTER — Encounter: Payer: Self-pay | Admitting: Family Medicine

## 2016-10-31 ENCOUNTER — Ambulatory Visit: Payer: Self-pay | Admitting: Family Medicine

## 2016-11-05 ENCOUNTER — Encounter: Payer: Self-pay | Admitting: Family Medicine

## 2016-11-05 ENCOUNTER — Ambulatory Visit (INDEPENDENT_AMBULATORY_CARE_PROVIDER_SITE_OTHER): Payer: 59 | Admitting: Licensed Clinical Social Worker

## 2016-11-05 DIAGNOSIS — F331 Major depressive disorder, recurrent, moderate: Secondary | ICD-10-CM

## 2016-11-05 DIAGNOSIS — F411 Generalized anxiety disorder: Secondary | ICD-10-CM | POA: Diagnosis not present

## 2016-11-05 NOTE — Progress Notes (Signed)
Comprehensive Clinical Assessment (CCA) Note  11/05/2016 Katherine Huynh [id number]  Visit Diagnosis:      ICD-10-CM   1. MDD (major depressive disorder), recurrent episode, moderate (HCC) F33.1   2. GAD (generalized anxiety disorder) F41.1       CCA Part One  Part One has been completed on paper by the patient.  (See scanned document in Chart Review)  CCA Part Two A  Intake/Chief Complaint:  CCA Intake With Chief Complaint CCA Part Two Date: 11/05/16 CCA Part Two Time: 1401 Chief Complaint/Presenting Problem: I have depression and anxiety. Patients Currently Reported Symptoms/Problems: I have had depression and anxiety for 8 years.  I an experiencing panic attacks (shut down, isolate, scream for no reason, cry).  I have these outbursts about 3 times per week.  I don't eat regularly nor do i sleep well.  I don't have a social life.  High school was terrible (people hated me). Individual's Strengths: honest, Individual's Preferences: a lot nicer Individual's Abilities: commuicates well Type of Services Patient Feels Are Needed: therapy and medication  Mental Health Symptoms Depression:  Depression: Irritability, Sleep (too much or little), Tearfulness, Increase/decrease in appetite, Difficulty Concentrating, Change in energy/activity, Hopelessness, Worthlessness, Fatigue  Mania:  Mania: N/A  Anxiety:   Anxiety: Worrying, Irritability, Fatigue, Difficulty concentrating, Sleep, Tension  Psychosis:  Psychosis: N/A  Trauma:  Trauma: Avoids reminders of event, Re-experience of traumatic event, Irritability/anger  Obsessions:  Obsessions: N/A  Compulsions:  Compulsions: N/A  Inattention:  Inattention: N/A  Hyperactivity/Impulsivity:  Hyperactivity/Impulsivity: N/A  Oppositional/Defiant Behaviors:  Oppositional/Defiant Behaviors: N/A  Borderline Personality:  Emotional Irregularity: N/A  Other Mood/Personality Symptoms:      Mental Status Exam Appearance and self-care  Stature:   Stature: Average  Weight:  Weight: Obese  Clothing:  Clothing: Neat/clean  Grooming:  Grooming: Well-groomed  Cosmetic use:  Cosmetic Use: None  Posture/gait:  Posture/Gait: Normal  Motor activity:  Motor Activity: Not Remarkable  Sensorium  Attention:  Attention: Normal  Concentration:  Concentration: Normal  Orientation:  Orientation: X5  Recall/memory:  Recall/Memory: Normal  Affect and Mood  Affect:  Affect: Appropriate  Mood:  Mood: Euthymic  Relating  Eye contact:  Eye Contact: Normal  Facial expression:  Facial Expression: Responsive  Attitude toward examiner:  Attitude Toward Examiner: Cooperative  Thought and Language  Speech flow: Speech Flow: Normal  Thought content:  Thought Content: Appropriate to mood and circumstances  Preoccupation:     Hallucinations:     Organization:     Company secretary of Knowledge:  Fund of Knowledge: Average  Intelligence:  Intelligence: Average  Abstraction:  Abstraction: Normal  Judgement:  Judgement: Normal  Reality Testing:  Reality Testing: Adequate  Insight:  Insight: Good  Decision Making:  Decision Making: Normal  Social Functioning  Social Maturity:  Social Maturity: Responsible  Social Judgement:  Social Judgement: Normal  Stress  Stressors:  Stressors: Transitions, Arts administrator (being dependent on others)  Coping Ability:     Skill Deficits:     Supports:      Family and Psychosocial History: Family history Marital status: Long term relationship Long term relationship, how long?: 31yrs What types of issues is patient dealing with in the relationship?: financial (I can't hold down a job) Are you sexually active?: Yes What is your sexual orientation?: heterosexual Has your sexual activity been affected by drugs, alcohol, medication, or emotional stress?: denies Does patient have children?: No  Childhood History:  Childhood History By whom was/is the patient raised?:  Both parents Additional childhood history  information: Born in [place]. Describes childhood as: quiet, cried all the time Description of patient's relationship with caregiver when they were a child: Mother: I didn't really have one.  Closer to my Grandmother.  My mom worked all the time.  I was clingy to my mom.  Father: we were cool and friends.  he didn;t seem like a dad. he is a free spirit Patient's description of current relationship with people who raised him/her: Mother: clingy to her Father: clingy to him. (I see and talk to my parent daily.) How were you disciplined when you got in trouble as a child/adolescent?: grounding, loss of privileges Does patient have siblings?: Yes (Katherine Huynh 27, Katherine Huynh 23, Katherine Huynh 18) Number of Siblings: 3 Description of patient's current relationship with siblings: my relationship with them is [employer].  Katherine Huynh was really never around.  She has a different mom so she grew up in a different town.  Katherine Huynh has a different mom so she came for visits often.  Katherine Huynh & I argue and fight but we have a good bond. Did patient suffer any verbal/emotional/physical/sexual abuse as a child?: No Did patient suffer from severe childhood neglect?: No Has patient ever been sexually abused/assaulted/raped as an adolescent or adult?: No Was the patient ever a victim of a crime or a disaster?: No Witnessed domestic violence?: Yes Has patient been effected by domestic violence as an adult?: No Description of domestic violence: I am very understanding of people situations  CCA Part Two B  Employment/Work Situation: Employment / Work Situation Employment situation: Employed Where is patient currently employed?: [employer] How long has patient been employed?: a few months Patient's job has been impacted by current illness: Yes Describe how patient's job has been impacted: i am stressed out all of the time.  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  Currently wanting to attend [employer] in [place]) Did You Attend Graduate School?: No Did You Have An Individualized Education Program (IIEP): No Did You Have Any Difficulty At [employer]?: No  Religion: Religion/Spirituality Are You A Religious Person?: Yes What is Your Religious Affiliation?:  (Reports "a mixture between catholic and buddhist") How Might This Affect Treatment?: "I don't know"  Leisure/Recreation: Leisure / Recreation Leisure and Hobbies: draw, artistic things Music therapist), cooking  Exercise/Diet: Exercise/Diet Do You Exercise?: Yes What Type of Exercise Do You Do?: Run/Walk, Other (Comment) (yoga) How Many Times a Week Do You Exercise?: 1-3 times a week Have You Gained or Lost A Significant Amount of Weight in the Past Six Months?: No Do You Follow a Special Diet?: No Do You Have Any Trouble Sleeping?: Yes Explanation of Sleeping Difficulties: difficulty with falling asleep and staying asleep  CCA Part Two C  Alcohol/Drug Use: Alcohol / Drug Use Pain Medications: denies Prescriptions: prozac, metformin, claritin Over the Counter: zzzquil History of alcohol / drug use?: Yes Substance #1 Name of Substance 1: Alcohol 1 - Age of First Use: 18 1 - Amount (size/oz): 2; 5 ounce glasses of wine 1 - Frequency:  twice weekly 1 - Duration: a year 1 - Last Use / Amount: 11/04/2016; 1 glass of wine                    CCA Part Three  ASAM's:  Six Dimensions of Multidimensional Assessment  Dimension 1:  Acute Intoxication and/or Withdrawal Potential:     Dimension 2:  Biomedical Conditions and  Complications:     Dimension 3:  Emotional, Behavioral, or Cognitive Conditions and Complications:     Dimension 4:  Readiness to Change:     Dimension 5:  Relapse, Continued use, or Continued Problem Potential:     Dimension 6:  Recovery/Living Environment:      Substance use Disorder (SUD)    Social Function:  Social Functioning Social Maturity: Responsible Social Judgement: Normal  Stress:  Stress Stressors: Transitions, Arts administratorMoney (being dependent on others) Patient Takes Medications The Way The Doctor Instructed?: Yes Priority Risk: Low Acuity  Risk Assessment- Self-Harm Potential: Risk Assessment For Self-Harm Potential Thoughts of Self-Harm: No current thoughts Method: No plan Availability of Means: No access/NA  Risk Assessment -Dangerous to Others Potential: Risk Assessment For Dangerous to Others Potential Method: No Plan Availability of Means: No access or NA Intent: Vague intent or NA Notification Required: No need or identified person  DSM5 Diagnoses: Patient Active Problem List   Diagnosis Date Noted  . General counseling and advice on contraceptive management 10/22/2016  . Medication monitoring encounter 10/12/2016  . Murmur, cardiac 10/12/2016  . Polycystic disease, ovaries 10/02/2016  . 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 Morbid obesity (HCC) 02/17/2016  . Difficulty controlling anger 02/17/2016  . Binge eating disorder 02/17/2016  . Athlete's foot, left 10/10/2015  . Personal history of sulfonamide allergy 09/15/2015  . Onychomycosis 09/15/2015  . Fam hx-ischem heart disease 09/15/2015    Patient Centered Plan: Will complete at the next session with patient.  Recommendations for Services/Supports/Treatments: Recommendations for Services/Supports/Treatments Recommendations For Services/Supports/Treatments: Individual Therapy, Medication Management  Treatment Plan Summary:    Referrals to Alternative Service(s): Referred to  Alternative Service(s):   Place:   Date:   Time:    Referred to Alternative Service(s):   Place:   Date:   Time:    Referred to Alternative Service(s):   Place:   Date:   Time:    Referred to Alternative Service(s):   Place:   Date:   Time:     Marinda Elkicole M Peacock

## 2016-11-06 ENCOUNTER — Ambulatory Visit: Payer: 59 | Admitting: Dietician

## 2016-11-07 ENCOUNTER — Ambulatory Visit (INDEPENDENT_AMBULATORY_CARE_PROVIDER_SITE_OTHER): Payer: 59 | Admitting: Obstetrics and Gynecology

## 2016-11-07 ENCOUNTER — Encounter: Payer: Self-pay | Admitting: Obstetrics and Gynecology

## 2016-11-07 VITALS — BP 143/93 | HR 78 | Ht 63.0 in | Wt 294.5 lb

## 2016-11-07 DIAGNOSIS — Z30013 Encounter for initial prescription of injectable contraceptive: Secondary | ICD-10-CM | POA: Diagnosis not present

## 2016-11-07 LAB — POCT URINE PREGNANCY: Preg Test, Ur: NEGATIVE

## 2016-11-07 MED ORDER — MEDROXYPROGESTERONE ACETATE 150 MG/ML IM SUSP: 150.0000 mg | INTRAMUSCULAR | 4 refills | Status: DC

## 2016-11-07 MED ORDER — MEDROXYPROGESTERONE ACETATE 150 MG/ML IM SUSP
150.0000 mg | Freq: Once | INTRAMUSCULAR | Status: AC
  Administered 2016-11-07: 150 mg via INTRAMUSCULAR

## 2016-11-07 NOTE — Progress Notes (Signed)
Subjective:     Patient ID: Katherine BarrJalisa Huynh, female   DOB: 02/09/1996, 21 y.o.   MRN: [id number]  HPI Desires switching from OCPs to Depo. Can't remember to take pills regularly. Does reports menses are now normal with the OCPs. Reviewed previous visit and medications from endocrinologist. States she is taking metformin as directed and tolerating fine. Is no longer in relationship and not trying for pregnancy.   Review of Systems Negative     Objective:   Physical Exam A&Ox4 Well groomed female in no distress Blood pressure (!) 143/93, pulse 78, height 5\' 3"  (1.6 m), weight 294 lb 8 oz (133.6 kg), last menstrual period 10/18/2016. PE declined     Assessment:     Contraception management Morbid obesity     Plan:     Counseled on concerns about going back on depo and weight gain and not best treatment for PCOS, but she strongly desires depo at this time in life. Medication picked up from pharmacy and first injection given today. To stop OCPs as of today. RTC in 3 months or as needed.  >50% of 10 minute visit spent in counseling. Katherine Huynh, CNM

## 2016-11-12 ENCOUNTER — Ambulatory Visit (INDEPENDENT_AMBULATORY_CARE_PROVIDER_SITE_OTHER): Payer: 59 | Admitting: Licensed Clinical Social Worker

## 2016-11-12 DIAGNOSIS — F411 Generalized anxiety disorder: Secondary | ICD-10-CM | POA: Diagnosis not present

## 2016-11-12 DIAGNOSIS — F331 Major depressive disorder, recurrent, moderate: Secondary | ICD-10-CM | POA: Diagnosis not present

## 2016-11-12 NOTE — Progress Notes (Signed)
   THERAPIST PROGRESS NOTE  Session Time: 60 min  Participation Level: Active  Behavioral Response: CasualAlertEuthymic  Type of Therapy: Individual Therapy  Treatment Goals addressed: Coping and Diagnosis: Depression  Interventions: CBT, Solution Focused and Reframing  Summary: Katherine Huynh is a 21 y.o. female who presents with continued symptoms of her treatment. Therapist met with Patient in an initial therapy session to assess current mood and to build rapport. Therapist engaged Patient in discussion about her life and what is going well for her. Therapist provided support for Patient as she shared details about her life, her current stressors, mood, coping skills, her past. Therapist prompted Patient to discuss her support system and ways that she manages her daily stress, anger, and frustrations.    Suicidal/Homicidal: No  Therapist Response: Assessed pt current functioning per pt report.  Focused on rapport building w/ pt and exploring w/ pt her tx hx and what she is looking for in counseling currently.  Discussed current symptoms and focused on pt utilizing her strengths w/ typical struggles.  Developed tx plan w/ pt.  Plan: Return again in 1 weeks.  Diagnosis: Axis I: Generalized Anxiety Disorder and Major Depression, Recurrent severe    Axis II: No diagnosis    Lubertha South, LCSW 11/12/2016

## 2016-11-15 ENCOUNTER — Ambulatory Visit: Payer: 59 | Admitting: Dietician

## 2016-11-19 ENCOUNTER — Ambulatory Visit (INDEPENDENT_AMBULATORY_CARE_PROVIDER_SITE_OTHER): Payer: 59 | Admitting: Psychiatry

## 2016-11-19 ENCOUNTER — Encounter: Payer: Self-pay | Admitting: Psychiatry

## 2016-11-19 VITALS — BP 133/84 | HR 94 | Temp 99.0°F | Wt 300.2 lb

## 2016-11-19 DIAGNOSIS — F331 Major depressive disorder, recurrent, moderate: Secondary | ICD-10-CM | POA: Diagnosis not present

## 2016-11-19 DIAGNOSIS — F411 Generalized anxiety disorder: Secondary | ICD-10-CM

## 2016-11-19 MED ORDER — BUSPIRONE HCL 5 MG PO TABS: 5.0000 mg | ORAL_TABLET | Freq: Two times a day (BID) | ORAL | 1 refills | Status: DC

## 2016-11-19 NOTE — Progress Notes (Signed)
Psychiatric progress note  Patient Identification: Katherine Huynh MRN:  [id number] Date of Evaluation:  11/19/2016 Referral Source: Dr.Lada Chief Complaint:   Chief Complaint    Follow-up; Medication Refill     Visit Diagnosis:    ICD-10-CM   1. MDD (major depressive disorder), recurrent episode, moderate (HCC) F33.1   2. GAD (generalized anxiety disorder) F41.1     History of Present Illness:  Patient is a 21 year old African-American female who presents today for a follow up of her depression and anxiety and was referred by her primary care physician Dr. Sherie Don. She denies any current suicidal thoughts. She reports decreased drinking to once a week. She reports that she is currently not working. 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 States that she would like to be a nonprofessional mid wife that provides support. States she is not interested in going to full-time school and feels like she is not consistent with her efforts.  Past Psychiatric History: Patient has never been admitted psychiatrically. She has never seen a psychiatrist. She saw a therapist in the past.  Previous Psychotropic Medications: Yes   Substance Abuse History in the last 12 months:  Yes.    Consequences of Substance Abuse: Negative  Past Medical History:  Past Medical History:  Diagnosis Date  . Depression   . Fam hx-ischem heart disease 09/15/2015  . Heart murmur   . Morbid obesity (HCC) 02/17/2016  . Polycystic disease, ovaries     Past Surgical History:  Procedure Laterality Date  . NO PAST SURGERIES      Family Psychiatric History: sisters have anxiety.  Family History:  Family History  Problem Relation Age of Onset  . Cancer Maternal Grandmother        breast  . Heart disease Maternal Grandfather   . Heart disease Paternal Grandfather   . Alcohol abuse Sister   . ADD / ADHD Sister     Social History:   Social History   Social History  . Marital status:  Single    Spouse name: N/A  . Number of children: N/A  . Years of education: N/A   Social History Main Topics  . Smoking status: Former Smoker    Types: Cigarettes    Quit date: 09/28/2016  . Smokeless tobacco: Never Used  . Alcohol use 3.6 oz/week    3 Glasses of wine, 1 Shots of liquor, 2 Cans of beer per week     Comment: occas  . Drug use: Yes    Types: Marijuana     Comment: last used about a month ago  . Sexual activity: Yes    Birth control/ protection: None   Other Topics Concern  . None   Social History Narrative  . None    Additional Social History: Patient is currently living with her parents and her fianc. Reports having good relationship with parents and with fianc who works as a Financial risk analyst.  Allergies:   Allergies  Allergen Reactions  . Cinnamon Itching  . Erythromycin Other (See Comments)    Unknown reaction, happened in childhood  . Sulfa Antibiotics Rash    Metabolic Disorder Labs: Lab Results  Component Value Date   HGBA1C 5.5 06/20/2016   Lab Results  Component Value Date   PROLACTIN 7.6 06/20/2016   Lab Results  Component Value Date   CHOL 154 06/20/2016   TRIG 44 06/20/2016   HDL 43 06/20/2016   CHOLHDL 3.6 06/20/2016   VLDL 19 02/17/2016  LDLCALC 102 (H) 06/20/2016   LDLCALC 71 02/17/2016     Current Medications: Current Outpatient Prescriptions  Medication Sig Dispense Refill  . FLUoxetine (PROZAC) 20 MG tablet Take 2 tablets (40 mg total) by mouth daily. 90 tablet 0  . loratadine (CLARITIN) 10 MG tablet Take 1 tablet (10 mg total) by mouth daily as needed for allergies or itching. 30 tablet 11  . medroxyPROGESTERone (DEPO-PROVERA) 150 MG/ML injection Inject 1 mL (150 mg total) into the muscle every 3 (three) months. 1 mL 4  . metFORMIN (GLUCOPHAGE) 500 MG tablet Take 500 mg by mouth.    . traZODone (DESYREL) 50 MG tablet Take 1 tablet (50 mg total) by mouth at bedtime. 30 tablet 1   No current facility-administered medications for  this visit.     Neurologic: Headache: No Seizure: No Paresthesias:No  Musculoskeletal: Strength & Muscle Tone: within normal limits Gait & Station: normal Patient leans: N/A  Psychiatric Specialty Exam: ROS  Blood pressure 133/84, pulse 94, temperature 99 F (37.2 C), temperature source Oral, weight (!) 300 lb 3.2 oz (136.2 kg), last menstrual period 08/18/2016.Body mass index is 53.18 kg/m.  General Appearance: Casual  Eye Contact:  Fair  Speech:  Clear and Coherent  Volume:  Decreased  Mood:  better  Affect:  Constricted and Flat  Thought Process:  Coherent  Orientation:  Full (Time, Place, and Person)  Thought Content:  Logical and Rumination  Suicidal Thoughts:  No  Homicidal Thoughts:  No  Memory:  Immediate;   Fair Recent;   Fair Remote;   Fair  Judgement:  Fair  Insight:  Fair  Psychomotor Activity:  Normal  Concentration:  Concentration: Fair and Attention Span: Fair  Recall:  [employer]  Fund of Knowledge:Fair  Language: Fair  Akathisia:  No  Handed:  Right  AIMS (if indicated):  na  Assets:  Communication Skills Desire for Improvement Housing Resilience Social Support Vocational/Educational  ADL's:  Intact  Cognition: WNL  Sleep:  poor    Treatment Plan Summary: Major depressive disorder moderate Continue Prozac at 40 mg daily Continue therapy with Nolon RodNicole Peacock.  Generalized anxiety disorder Start buspar at 5mg  po bid.   Continue Trazodone at 50 mg at bedtime.  Return to clinic in 4 weeks time or call before if needed. Patient aware of a safety plan if she has suicidal thoughts.   Patrick NorthAVI, Arth Nicastro, MD 7/9/20182:08 PM

## 2016-11-24 ENCOUNTER — Encounter: Payer: Self-pay | Admitting: Family Medicine

## 2016-11-26 ENCOUNTER — Ambulatory Visit: Payer: 59 | Admitting: Licensed Clinical Social Worker

## 2016-12-04 ENCOUNTER — Ambulatory Visit: Payer: 59 | Admitting: Licensed Clinical Social Worker

## 2016-12-25 ENCOUNTER — Ambulatory Visit (INDEPENDENT_AMBULATORY_CARE_PROVIDER_SITE_OTHER): Payer: 59 | Admitting: Psychiatry

## 2016-12-25 ENCOUNTER — Encounter: Payer: Self-pay | Admitting: Psychiatry

## 2016-12-25 ENCOUNTER — Ambulatory Visit (INDEPENDENT_AMBULATORY_CARE_PROVIDER_SITE_OTHER): Payer: 59 | Admitting: Family Medicine

## 2016-12-25 ENCOUNTER — Encounter: Payer: Self-pay | Admitting: Family Medicine

## 2016-12-25 VITALS — BP 130/86 | HR 105 | Temp 99.0°F | Wt 300.6 lb

## 2016-12-25 VITALS — BP 132/86 | HR 101 | Temp 98.7°F | Resp 16 | Wt 300.7 lb

## 2016-12-25 DIAGNOSIS — N644 Mastodynia: Secondary | ICD-10-CM | POA: Diagnosis not present

## 2016-12-25 DIAGNOSIS — F411 Generalized anxiety disorder: Secondary | ICD-10-CM

## 2016-12-25 DIAGNOSIS — F331 Major depressive disorder, recurrent, moderate: Secondary | ICD-10-CM | POA: Diagnosis not present

## 2016-12-25 MED ORDER — FLUOXETINE HCL 20 MG PO TABS: 40.0000 mg | ORAL_TABLET | Freq: Every day | ORAL | 2 refills | Status: DC

## 2016-12-25 MED ORDER — BUSPIRONE HCL 5 MG PO TABS: 5.0000 mg | ORAL_TABLET | Freq: Two times a day (BID) | ORAL | 1 refills | Status: DC

## 2016-12-25 NOTE — Progress Notes (Signed)
Psychiatric progress note  Patient Identification: Katherine BarrJalisa Huynh MRN:  [id number] Date of Evaluation:  12/25/2016 Referral Source: Dr.Lada Chief Complaint:   Chief Complaint    Follow-up; Medication Refill     Visit Diagnosis:    ICD-10-CM   1. MDD (major depressive disorder), recurrent episode, moderate (HCC) F33.1   2. GAD (generalized anxiety disorder) F41.1     History of Present Illness:  Patient is a 21 year old African-American female who presents today for a follow up of her depression and anxiety. Reports the buspar has been helpful with her night time anxiety. States she has been sleeping better and it has helped with her mood significantly. Not taking the trazodone anymore. 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  Denies any suicidal thoughts.   Past Psychiatric History: Patient has never been admitted psychiatrically. She has never seen a psychiatrist. She saw a therapist in the past.  Previous Psychotropic Medications: Yes   Substance Abuse History in the last 12 months:  Yes.    Consequences of Substance Abuse: Negative  Past Medical History:  Past Medical History:  Diagnosis Date  . Depression   . Fam hx-ischem heart disease 09/15/2015  . Heart murmur   . Morbid obesity (HCC) 02/17/2016  . Polycystic disease, ovaries     Past Surgical History:  Procedure Laterality Date  . NO PAST SURGERIES      Family Psychiatric History: sisters have anxiety.  Family History:  Family History  Problem Relation Age of Onset  . Cancer Maternal Grandmother        breast  . Heart disease Maternal Grandfather   . Heart disease Paternal Grandfather   . Alcohol abuse Sister   . ADD / ADHD Sister     Social History:   Social History   Social History  . Marital status: Single    Spouse name: N/A  . Number of children: N/A  . Years of education: N/A   Social History Main Topics  . Smoking status: Former Smoker    Types: Cigarettes    Quit date:  09/28/2016  . Smokeless tobacco: Never Used  . Alcohol use 3.6 oz/week    3 Glasses of wine, 1 Shots of liquor, 2 Cans of beer per week     Comment: occas  . Drug use: Yes    Types: Marijuana     Comment: last used about a month ago  . Sexual activity: Yes    Birth control/ protection: None   Other Topics Concern  . None   Social History Narrative  . None    Additional Social History: Patient is currently living with her parents and her fianc. Reports having good relationship with parents and with fianc who works as a Financial risk analystCook.  Allergies:   Allergies  Allergen Reactions  . Cinnamon Itching  . Erythromycin Other (See Comments)    Unknown reaction, happened in childhood  . Sulfa Antibiotics Rash    Metabolic Disorder Labs: Lab Results  Component Value Date   HGBA1C 5.5 06/20/2016   Lab Results  Component Value Date   PROLACTIN 7.6 06/20/2016   Lab Results  Component Value Date   CHOL 154 06/20/2016   TRIG 44 06/20/2016   HDL 43 06/20/2016   CHOLHDL 3.6 06/20/2016   VLDL 19 02/17/2016   LDLCALC 102 (H) 06/20/2016   LDLCALC 71 02/17/2016     Current Medications: Current Outpatient Prescriptions  Medication Sig Dispense Refill  . busPIRone (BUSPAR) 5 MG tablet Take 1 tablet (5  mg total) by mouth 2 (two) times daily. 60 tablet 1  . FLUoxetine (PROZAC) 20 MG tablet Take 2 tablets (40 mg total) by mouth daily. 90 tablet 0  . loratadine (CLARITIN) 10 MG tablet Take 1 tablet (10 mg total) by mouth daily as needed for allergies or itching. 30 tablet 11  . medroxyPROGESTERone (DEPO-PROVERA) 150 MG/ML injection Inject 1 mL (150 mg total) into the muscle every 3 (three) months. 1 mL 4  . metFORMIN (GLUCOPHAGE) 500 MG tablet Take 500 mg by mouth.    . traZODone (DESYREL) 50 MG tablet Take 1 tablet (50 mg total) by mouth at bedtime. 30 tablet 1   No current facility-administered medications for this visit.     Neurologic: Headache: No Seizure:  No Paresthesias:No  Musculoskeletal: Strength & Muscle Tone: within normal limits Gait & Station: normal Patient leans: N/A  Psychiatric Specialty Exam: ROS  Blood pressure 130/86, pulse (!) 105, temperature 99 F (37.2 C), temperature source Oral, weight (!) 300 lb 9.6 oz (136.4 kg), last menstrual period 12/20/2016.Body mass index is 53.25 kg/m.  General Appearance: Casual  Eye Contact:  Fair  Speech:  Clear and Coherent  Volume:  Decreased  Mood:  better  Affect:  pleasant  Thought Process:  Coherent  Orientation:  Full (Time, Place, and Person)  Thought Content:  normal  Suicidal Thoughts:  No  Homicidal Thoughts:  No  Memory:  Immediate;   Fair Recent;   Fair Remote;   Fair  Judgement:  Fair  Insight:  Fair  Psychomotor Activity:  Normal  Concentration:  Concentration: Fair and Attention Span: Fair  Recall:  [employer] of Knowledge:Fair  Language: Fair  Akathisia:  No  Handed:  Right  AIMS (if indicated):  na  Assets:  Communication Skills Desire for Improvement Housing Resilience Social Support Vocational/Educational  ADL's:  Intact  Cognition: WNL  Sleep:  better    Treatment Plan Summary: Major depressive disorder moderate Continue Prozac at 40 mg daily Continue therapy with Nolon Rod.  Generalized anxiety disorder Continue buspar at 5mg  po bid.   Continue Trazodone at 50 mg at bedtime.  Return to clinic in 2 months time or call before if needed. Patient aware of a safety plan if she has suicidal thoughts.   Patrick North, MD 8/14/20182:14 PM

## 2016-12-25 NOTE — Patient Instructions (Addendum)
Start over-the-counter Aleve 220 mg one by mouth twice a day for tenderness, take with food; just for about a week, then stop Start over-the-counter vitamin E, and follow package directions Wear a good supportive bra Return in 6-8 weeks for breast recheck Let me know sooner of any discharge or change in your breast health  Breast Tenderness Breast tenderness is a common problem for women of all ages. Breast tenderness may cause mild discomfort to severe pain. The pain usually comes and goes in association with your menstrual cycle, but it can be constant. Breast tenderness has many possible causes, including hormone changes and some medicines. Your health care provider may order tests, such as a mammogram or an ultrasound, to check for any unusual findings. Having breast tenderness usually does not mean that you have breast cancer. Follow these instructions at home: Sometimes, reassurance that you do not have breast cancer is all that is needed. In general, follow these home care instructions: Managing pain and discomfort  If directed, apply ice to the area: ? Put ice in a plastic bag. ? Place a towel between your skin and the bag. ? Leave the ice on for 20 minutes, 2-3 times a day.  Make sure you are wearing a supportive bra, especially during exercise. You may also want to wear a supportive bra while sleeping if your breasts are very tender. Medicines  Take over-the-counter and prescription medicines only as told by your health care provider. If the cause of your pain is infection, you may be prescribed an antibiotic medicine.  If you were prescribed an antibiotic, take it as told by your health care provider. Do not stop taking the antibiotic even if you start to feel better. General instructions  Your health care provider may recommend that you reduce the amount of fat in your diet. You can do this by: ? Limiting fried foods. ? Cooking foods using methods, such as baking, boiling,  grilling, and broiling.  Decrease the amount of caffeine in your diet. You can do this by drinking more water and choosing caffeine-free options.  Keep a log of the days and times when your breasts are most tender.  Ask your health care provider how to do breast exams at home. This will help you notice if you have an unusual growth or lump. Contact a health care provider if:  Any part of your breast is hard, red, and hot to the touch. This may be a sign of infection.  You are not breastfeeding and you have fluid, especially blood or pus, coming out of your nipples.  You have a fever.  You have a new or painful lump in your breast that remains after your menstrual period ends.  Your pain does not improve or it gets worse.  Your pain is interfering with your daily activities. This information is not intended to replace advice given to you by your health care provider. Make sure you discuss any questions you have with your health care provider. Document Released: 04/12/2008 Document Revised: 01/27/2016 Document Reviewed: 01/27/2016 Elsevier Interactive Patient Education  [employer]2018 Elsevier Inc.

## 2016-12-25 NOTE — Progress Notes (Signed)
BP 132/86   Pulse (!) 101   Temp 98.7 F (37.1 C) (Oral)   Resp 16   Wt (!) 300 lb 11.2 oz (136.4 kg)   LMP 12/20/2016   SpO2 95%   BMI 53.27 kg/m    Subjective:    Patient ID: Katherine Huynh, female    DOB: 02/21/1996, 21 y.o.   MRN: [id number]  HPI: Katherine Huynh is a 21 y.o. female  Chief Complaint  Patient presents with  . Breast Pain    bilateral    HPI  Started about two weeks ago Not related to menstrual period at all Bilateral, left breast worse than right Pain feels like... "that's a hard question..."; like a pressure, like someone squeezing the breast; actually in the breast tissue, not heart or lungs No discharge from the nipples No redness or fever Nothing similar in the past She has not tried anything Drinks a fair amount of caffeine, five drinks in a week Breast cancer: one maternal aunt, one paternal aunt, and a few second cousins (in their 2240s) Grandmother had benign tumor (breast) Not sure about any ovarian cancer in the family, but lots of women in the family have had hysterectomies; not sure why Tends to go braless for most of the day  Mood; she is going [facility] therapist and [facility]  [facility] 12/25/2016 10/12/2016 04/17/2016 03/06/2016 02/17/2016  Decreased Interest 0 0 1 1 3   Down, Depressed, Hopeless 3 0 1 1 3   PHQ - 2 Score 3 0 2 2 6   Altered sleeping 2 - 3 1 3   Tired, decreased energy 2 - 3 1 3   Change in appetite 3 - 3 1 3   Feeling bad or failure about yourself  1 - 0 1 3  Trouble concentrating 1 - 0 0 3  Moving slowly or fidgety/restless 0 - 0 0 0  Suicidal thoughts 0 - 0 0 0  PHQ-9 Score 12 - 11 6 21   Difficult doing work/chores Somewhat difficult - Somewhat difficult Not difficult at all Not difficult at all    Relevant past medical, surgical, family and social history reviewed Past Medical History:  Diagnosis Date  . Depression   . Fam hx-ischem heart disease 09/15/2015  . Heart murmur   .  Morbid obesity (HCC) 02/17/2016  . Polycystic disease, ovaries    Past Surgical History:  Procedure Laterality Date  . NO PAST SURGERIES     Family History  Problem Relation Age of Onset  . Cancer Maternal Grandmother        breast  . Heart disease Maternal Grandfather   . Heart disease Paternal Grandfather   . Alcohol abuse Sister   . ADD / ADHD Sister    Social History   Social History  . Marital status: Single    Spouse name: N/A  . Number of children: N/A  . Years of education: N/A   Occupational History  . Not on file.   Social History Main Topics  . Smoking status: Former Smoker    Types: Cigarettes    Quit date: 09/28/2016  . Smokeless tobacco: Never Used  . Alcohol use 3.6 oz/week    3 Glasses of wine, 1 Shots of liquor, 2 Cans of beer per week     Comment: occas  . Drug use: Yes    Types: Marijuana     Comment: last used about a month ago  . Sexual activity: Yes    Birth  control/ protection: None   Other Topics Concern  . Not on file   Social History Narrative  . No narrative on file    Interim medical history since last visit reviewed. Allergies and medications reviewed  Review of Systems Per HPI unless specifically indicated above     Objective:    BP 132/86   Pulse (!) 101   Temp 98.7 F (37.1 C) (Oral)   Resp 16   Wt (!) 300 lb 11.2 oz (136.4 kg)   LMP 12/20/2016   SpO2 95%   BMI 53.27 kg/m   Wt Readings from Last 3 Encounters:  12/25/16 (!) 300 lb 11.2 oz (136.4 kg)  11/07/16 294 lb 8 oz (133.6 kg)  10/22/16 294 lb 9.6 oz (133.6 kg)    Physical Exam  Cardiovascular: Normal rate.   Pulmonary/Chest: Effort normal. Right breast exhibits no inverted nipple, no mass, no nipple discharge, no skin change and no tenderness. Left breast exhibits no inverted nipple, no mass, no nipple discharge, no skin change and no tenderness. Breasts are symmetrical.  Skin: No rash noted.   Results for orders placed or performed in visit on 11/07/16    POCT urine pregnancy  Result Value Ref Range   Preg Test, Ur Negative Negative      Assessment & Plan:   Problem List Items Addressed This Visit    None    Visit Diagnoses    Breast tenderness in female    -  Primary   absolutely nothing abnormal on exam; recommended Aleve BID, supportive sports bra, ice, vitamin E; will re-examine ini 6-8 weeks; call sooner prn       Follow up plan: Return 6-8 weeks, for follow-up visit with Dr. Sherie Don.  An after-visit summary was printed and given to the patient at check-out.  Please see the patient instructions which may contain other information and recommendations beyond what is mentioned above in the assessment and plan.  No orders of the defined types were placed in this encounter.   No orders of the defined types were placed in this encounter.

## 2016-12-28 ENCOUNTER — Other Ambulatory Visit: Payer: Self-pay | Admitting: Family Medicine

## 2017-01-03 ENCOUNTER — Ambulatory Visit: Payer: 59 | Admitting: Licensed Clinical Social Worker

## 2017-01-14 ENCOUNTER — Encounter: Payer: Self-pay | Admitting: Family Medicine

## 2017-02-12 ENCOUNTER — Ambulatory Visit: Payer: Self-pay | Admitting: Family Medicine

## 2017-02-25 ENCOUNTER — Encounter: Payer: Self-pay | Admitting: Psychiatry

## 2017-02-25 ENCOUNTER — Ambulatory Visit (INDEPENDENT_AMBULATORY_CARE_PROVIDER_SITE_OTHER): Payer: 59 | Admitting: Psychiatry

## 2017-02-25 VITALS — BP 141/91 | HR 98 | Temp 98.4°F | Wt 308.2 lb

## 2017-02-25 DIAGNOSIS — F411 Generalized anxiety disorder: Secondary | ICD-10-CM

## 2017-02-25 DIAGNOSIS — F331 Major depressive disorder, recurrent, moderate: Secondary | ICD-10-CM | POA: Diagnosis not present

## 2017-02-25 MED ORDER — BUSPIRONE HCL 5 MG PO TABS: 5.0000 mg | ORAL_TABLET | Freq: Two times a day (BID) | ORAL | 1 refills | Status: DC

## 2017-02-25 MED ORDER — FLUOXETINE HCL 20 MG PO TABS: 40.0000 mg | ORAL_TABLET | Freq: Every day | ORAL | 2 refills | Status: DC
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
## 2017-02-26 ENCOUNTER — Ambulatory Visit: Payer: Self-pay | Admitting: Family Medicine

## 2017-02-28 ENCOUNTER — Encounter: Payer: Self-pay | Admitting: Obstetrics and Gynecology

## 2017-02-28 ENCOUNTER — Ambulatory Visit (INDEPENDENT_AMBULATORY_CARE_PROVIDER_SITE_OTHER): Payer: Self-pay | Admitting: Obstetrics and Gynecology

## 2017-02-28 VITALS — BP 131/84 | HR 96 | Ht 63.0 in | Wt 306.6 lb

## 2017-02-28 DIAGNOSIS — E282 Polycystic ovarian syndrome: Secondary | ICD-10-CM

## 2017-02-28 LAB — POCT URINE PREGNANCY: Preg Test, Ur: NEGATIVE

## 2017-02-28 NOTE — Progress Notes (Signed)
Subjective:     Patient ID: Katherine Huynh, female   DOB: 05/17/1995, 21 y.o.   MRN: [id number]  HPI Here as she now desires a pregnancy, verses continuing on Depo. LMP for 2 days in early September. Breast tenderness for two weeks. Nausea x 1 week, and doesn't want to eat. [facility] student [facility] [employer] and working PT. Sexually active with 2 Males partner.  Review of Systems Negative except stated above in HPI.    Objective:   Physical Exam  A&Ox4 Well groomed obese female in no distress Blood pressure 131/84, pulse 96, height 5\' 3"  (1.6 m), weight (!) 306 lb 9.6 oz (139.1 kg). UPT- PE not indicated      Assessment:     Irregular menses PCOS Morbid obesity Desires pregnancy    Plan:     Labs obtained-will follow up accordingly Discussed current sexual activity and reminded of pregnancy risk associated with current weight. Patient states understanding. Still desires pregnancy.  Will schedule follow up after labs resulted.  Melody Shambely,CNM

## 2017-03-01 LAB — FSH/LH
FSH: 4.9 m[IU]/mL
LH: 3.5 m[IU]/mL

## 2017-03-01 LAB — BETA HCG QUANT (REF LAB): hCG Quant: <1 m[IU]/mL

## 2017-03-01 LAB — PROGESTERONE: Progesterone: <0.1 ng/mL

## 2017-03-01 LAB — ESTRADIOL: Estradiol: 55.7 pg/mL

## 2017-03-01 LAB — TSH: TSH: 1.81 u[IU]/mL (ref 0.450–4.500)

## 2017-03-19 ENCOUNTER — Encounter: Payer: Self-pay | Admitting: Family Medicine

## 2017-03-19 ENCOUNTER — Ambulatory Visit: Payer: Self-pay | Admitting: Family Medicine

## 2017-03-19 VITALS — BP 118/80 | HR 90 | Temp 98.7°F | Resp 18 | Ht 63.0 in | Wt 301.4 lb

## 2017-03-19 DIAGNOSIS — L0100 Impetigo, unspecified: Secondary | ICD-10-CM

## 2017-03-19 MED ORDER — MUPIROCIN 2 % EX OINT: 1.0000 "application " | TOPICAL_OINTMENT | Freq: Two times a day (BID) | CUTANEOUS | 0 refills | Status: DC

## 2017-03-19 NOTE — Progress Notes (Signed)
Name: Katherine Huynh   MRN: [id number]    DOB: 02/13/1996   Date:03/19/2017       Progress Note  Subjective  Chief Complaint  Chief Complaint  Patient presents with  . Rash    bilaterl wrist, itching, painful for 3 days    HPI  Patient presents with rash to bilateral wrists:  RIGHT wrist began 4 days ago with reddish linear welt to the anterior aspect of the wrist, this then became a blister than oozed clear exudate on day 2, then the area began to dry and is now crusted and scabbing.   LEFT wrist began 2 days ago with the same red linear welt with similar progression - currently has nearly circumferential thin band of erythema with areas of yellow crusting.  She describes the lesions as itching and mildly tender.  She has not been around anyone with a similar rash, she has several skin allergies and is very cautious to avoid changes in products.  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  She is mostly practicing on mannequins and does not have live clients at this time.  No children, no pets, no recent insect bites, has not been working outdoors recently.  Patient Active Problem List   Diagnosis Date Noted  . General counseling and advice on contraceptive management 10/22/2016  . Medication monitoring encounter 10/12/2016  . Murmur, cardiac 10/12/2016  . Polycystic disease, ovaries 10/02/2016  . 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 Morbid obesity (HCC) 02/17/2016  . Difficulty controlling anger 02/17/2016  . Binge eating disorder 02/17/2016  . Athlete's foot, left 10/10/2015  . Personal history of sulfonamide allergy 09/15/2015  . Onychomycosis 09/15/2015  . Fam hx-ischem heart disease 09/15/2015    Social History   Tobacco Use  . Smoking status: Former Smoker    Types: Cigarettes    Last attempt to quit: 09/28/2016    Years since quitting: 0.4  . Smokeless  tobacco: Never Used  Substance Use Topics  . Alcohol use: Yes    Alcohol/week: 3.6 oz    Types: 3 Glasses of wine, 1 Shots of liquor, 2 Cans of beer per week    Comment: occas     Current Outpatient Medications:  .  busPIRone (BUSPAR) 5 MG tablet, Take 1 tablet (5 mg total) by mouth 2 (two) times daily., Disp: 60 tablet, Rfl: 1 .  FLUoxetine (PROZAC) 20 MG tablet, Take 2 tablets (40 mg total) by mouth daily., Disp: 60 tablet, Rfl: 2 .  loratadine (CLARITIN) 10 MG tablet, Take 1 tablet (10 mg total) by mouth daily as needed for allergies or itching., Disp: 30 tablet, Rfl: 11 .  traZODone (DESYREL) 50 MG tablet, Take 1 tablet (50 mg total) by mouth at bedtime., Disp: 30 tablet, Rfl: 1 .  medroxyPROGESTERone (DEPO-PROVERA) 150 MG/ML injection, Inject 1 mL (150 mg total) into the muscle every 3 (three) months. (Patient not taking: Reported on 03/19/2017), Disp: 1 mL, Rfl: 4 .  metFORMIN (GLUCOPHAGE) 500 MG tablet, Take 500 mg by mouth., Disp: , Rfl:   Allergies  Allergen Reactions  . Cinnamon Itching  . Erythromycin Other (See Comments)    Unknown reaction, happened in childhood  . Sulfa Antibiotics Rash    ROS  Constitutional: Negative for fever or weight change.  Respiratory: Negative for cough and shortness of breath.   Cardiovascular: Negative for chest pain or palpitations.  Gastrointestinal: Negative for abdominal pain, no bowel changes.  Musculoskeletal: Negative for gait problem or joint swelling.  Skin: Positive for rash.  Neurological: Negative for dizziness or headache.  No other specific complaints in a complete review of systems (except as listed in HPI above).  Objective  Vitals:   03/19/17 0939  BP: 118/80  Pulse: 90  Resp: 18  Temp: 98.7 F (37.1 C)  TempSrc: Oral  SpO2: 98%  Weight: (!) 301 lb 6.4 oz (136.7 kg)  Height: 5\' 3"  (1.6 m)   Body mass index is 53.39 kg/m.  Nursing Note and Vital Signs reviewed.  Physical Exam  Constitutional: Patient  appears well-developed and well-nourished. Obese No distress.  HEENT: head atraumatic, normocephalic Cardiovascular: Normal rate, regular rhythm, S1/S2 present.  No murmur or rub heard. No BLE edema. Pulmonary/Chest: Effort normal and breath sounds clear. No respiratory distress or retractions. Psychiatric: Patient has a normal mood and affect. behavior is normal. Judgment and thought content normal. Skin: RIGHT wrist: hyperpigmented linear lesion with scabbing along the anterior aspect; no underlying erythema, minimally tender on palpation LEFT wrist: nearly circumferential thin linear lesion with underlying erythema, dried sanguinous exudate confluent with a 1inch line of honey-colored crusting.  Recent Results (from the past 2160 hour(s))  POCT urine pregnancy     Status: None   Collection Time: 02/28/17  3:48 PM  Result Value Ref Range   Preg Test, Ur Negative Negative  FSH/LH     Status: None   Collection Time: 02/28/17  4:16 PM  Result Value Ref Range   LH 3.5 mIU/mL    Comment:                     Adult Female:                       Follicular phase      2.4 -  12.6                       Ovulation phase      14.0 -  95.6                       Luteal phase          1.0 -  11.4                       Postmenopausal        7.7 -  58.5    FSH 4.9 mIU/mL    Comment:                     Adult Female:                       Follicular phase      3.5 -  12.5                       Ovulation phase       4.7 -  21.5                       Luteal phase          1.7 -   7.7                       Postmenopausal  25.8 - 134.8   TSH     Status: None   Collection Time: 02/28/17  4:16 PM  Result Value Ref Range   TSH 1.810 0.450 - 4.500 uIU/mL  Progesterone     Status: None   Collection Time: 02/28/17  4:16 PM  Result Value Ref Range   Progesterone <0.1 ng/mL    Comment:                      Follicular phase       0.1 -   0.9                      Luteal phase           1.8 -  23.9                       Ovulation phase        0.1 -  12.0                      Pregnant                         First trimester    11.0 -  44.3                         Second trimester   25.4 -  83.3                         Third trimester    58.7 - 214.0                      Postmenopausal         0.0 -   0.1   Beta HCG, Quant     Status: None   Collection Time: 02/28/17  4:16 PM  Result Value Ref Range   hCG Quant <1 mIU/mL    Comment:                      Female (Non-pregnant)    0 -     5                             (Postmenopausal)  0 -     8                      Female (Pregnant)                      Weeks of Gestation                              3                6 -    71                              4               10 -   750  5              217 -  7138                              6              158 - 31795                              7             [id number]                              8            32065 -149571                              9            63803 -[id number]                             10            46509 -[id number]                             12            27832 -[id number]                             14            13950 - 62530                             15            12039 - 70971                             16             9040 - [phone] Roche E CLIA methodology   Estradiol     Status: None   Collection Time: 02/28/17  4:16 PM  Result Value Ref Range   Estradiol 55.7 pg/mL    Comment:                     Adult Female:                       Follicular phase   12.5 -   166.0  Ovulation phase    85.8 -   498.0                       Luteal phase       43.8 -   211.0                       Postmenopausal     <6.0 -    54.7                     Pregnancy                       1st trimester     215.0 - >4300.0                      Girls (1-10 years)    6.0 -    27.0 Roche ECLIA methodology      Assessment & Plan  1. Impetigo - mupirocin ointment (BACTROBAN) 2 %; Place 1 application 2 (two) times daily into the nose.  Dispense: 30 g; Refill: 0 - Advised that we will treat as impetigo due to honey colored crusting, however I offered reassurance as the right wrist appears to already be healing.  Advised to very much consider possible irritants including chemicals or gloves at work, products at home, etc.   - Return if symptoms worsen or fail to improve, for 3-4 days.  -Red flags and when to present for emergency care or RTC including fever >101.21F, increased redness, new lesions or rash, new/worsening/un-resolving symptoms, reviewed with patient at time of visit. Follow up and care instructions discussed and provided in AVS.

## 2017-03-19 NOTE — Patient Instructions (Addendum)
Impetigo, Adult Impetigo is an infection of the skin. It commonly occurs in young children, but it can also occur in adults. The infection causes itchy blisters and sores that produce brownish-yellow fluid. As the fluid dries, it forms a thick, honey-colored crust. These skin changes usually occur on the face but can also affect other areas of the body. Impetigo usually goes away in 7-10 days with treatment. What are the causes? Impetigo is caused by two types of bacteria. It may be caused by staphylococci or streptococci bacteria. These bacteria cause impetigo when they get under the surface of the skin. This often happens after some damage to the skin, such as damage from:  Cuts, scrapes, or scratches.  Insect bites, especially when you scratch the area of a bite.  Chickenpox or other illnesses that cause open skin sores.  Nail biting or chewing.  Impetigo is contagious and can spread easily from one person to another. This may occur through close skin contact or by sharing towels, clothing, or other items with a person who has the infection. What increases the risk? Some things that can increase the risk of getting this infection include:  Playing sports that include skin-to-skin contact with others.  Having a skin condition with open sores.  Having many skin cuts or scrapes.  Living in an area that has high humidity levels.  Having poor hygiene.  Having high levels of staphylococci in your nose.  What are the signs or symptoms? Impetigo usually starts out as small blisters, often on the face. The blisters then break open and turn into tiny sores (lesions) with a yellow crust. In some cases, the blisters cause itching or burning. With scratching, irritation, or lack of treatment, these small lesions may get larger. Scratching can also cause impetigo to spread to other parts of the body. The bacteria can get under the fingernails and spread when you touch another area of your  skin. Other possible symptoms include:  Larger blisters.  Pus.  Swollen lymph glands.  How is this diagnosed? This condition is usually diagnosed during a physical exam. A skin sample or sample of fluid from a blister may be taken for lab tests that involve growing bacteria (culture test). This can help confirm the diagnosis or help determine the best treatment. How is this treated? Mild impetigo can be treated with prescription antibiotic cream. Oral antibiotic medicine may be used in more severe cases. Medicines for itching may also be used. Follow these instructions at home:  Take medicines only as directed by your health care provider.  To help prevent impetigo from spreading to other body areas: ? Keep your fingernails short and clean. ? Do not scratch the blisters or sores. ? Cover infected areas, if necessary, to keep from scratching.  Gently wash the infected areas with antibiotic soap and water.  Soak crusted areas in warm, soapy water using antibiotic soap. ? Gently rub the areas to remove crusts. Do not scrub.  Wash your hands often to avoid spreading this infection.  Stay home until you have used an antibiotic cream for 48 hours (2 days) or an oral antibiotic medicine for 24 hours (1 day). You should only return to work and activities with other people if your skin shows significant improvement. How is this prevented? To keep the infection from spreading:  Stay home until you have used an antibiotic cream for 48 hours or an oral antibiotic for 24 hours.  Wash your hands often.  Do not engage in   skin-to-skin contact with other people while you have still have blisters.  Do not share towels, washcloths, or bedding with others while you have the infection.  Contact a health care provider if:  You develop more blisters or sores despite treatment.  Other family members get sores.  Your skin sores are not improving after 48 hours of treatment.  You have a  fever. Get help right away if:  You see spreading redness or swelling of the skin around your sores.  You see red streaks coming from your sores.  You develop a sore throat. This information is not intended to replace advice given to you by your health care provider. Make sure you discuss any questions you have with your health care provider. Document Released: 05/21/2014 Document Revised: 10/06/2015 Document Reviewed: 04/13/2014 Elsevier Interactive Patient Education  2017 [employer]  

## 2017-03-28 ENCOUNTER — Ambulatory Visit: Payer: 59 | Admitting: Psychiatry

## 2017-04-01 ENCOUNTER — Ambulatory Visit: Payer: 59 | Admitting: Internal Medicine

## 2017-04-23 ENCOUNTER — Encounter: Payer: 59 | Admitting: Obstetrics and Gynecology

## 2017-04-25 ENCOUNTER — Ambulatory Visit: Payer: Self-pay | Admitting: Family Medicine

## 2017-05-17 ENCOUNTER — Encounter: Payer: Self-pay | Admitting: Family Medicine

## 2017-05-17 ENCOUNTER — Ambulatory Visit: Payer: Managed Care, Other (non HMO) | Admitting: Family Medicine

## 2017-05-17 VITALS — BP 120/68 | HR 96 | Temp 98.9°F | Ht 63.0 in | Wt 304.0 lb

## 2017-05-17 DIAGNOSIS — N898 Other specified noninflammatory disorders of vagina: Secondary | ICD-10-CM

## 2017-05-17 DIAGNOSIS — Z113 Encounter for screening for infections with a predominantly sexual mode of transmission: Secondary | ICD-10-CM

## 2017-05-17 NOTE — Progress Notes (Signed)
BP 120/68 (BP Location: Right Arm, Patient Position: Sitting, Cuff Size: Large)   Pulse 96   Temp 98.9 F (37.2 C) (Oral)   Ht 5\' 3"  (1.6 m)   Wt (!) 304 lb (137.9 kg)   SpO2 99%   BMI 53.85 kg/m    Subjective:    Patient ID: Katherine Huynh, female    DOB: 01/02/1996, 22 y.o.   MRN: [id number]  HPI: Katherine Huynh is a 22 y.o. female  Chief Complaint  Patient presents with  . Exposure to STD    Denies pain or vaginal discharge     HPI Patient is here for an acute visit Wants to get checked for STDs No vaginal discharge No fevers, has a cold; runny nose, not sneezing much, no rash, little sore throat the first day; taking OTC medicine that is helping Not sexually active right now  [facility] 05/17/2017 12/25/2016 10/12/2016 04/17/2016 03/06/2016  Decreased Interest 0 0 0 1 1  Down, Depressed, Hopeless 0 3 0 1 1  PHQ - 2 Score 0 3 0 2 2  Altered sleeping - 2 - 3 1  Tired, decreased energy - 2 - 3 1  Change in appetite - 3 - 3 1  Feeling bad or failure about yourself  - 1 - 0 1  Trouble concentrating - 1 - 0 0  Moving slowly or fidgety/restless - 0 - 0 0  Suicidal thoughts - 0 - 0 0  PHQ-9 Score - 12 - 11 6  Difficult doing work/chores - Somewhat difficult - Somewhat difficult Not difficult at all    Relevant past medical, surgical, family and social history reviewed Past Medical History:  Diagnosis Date  . Depression   . Fam hx-ischem heart disease 09/15/2015  . Heart murmur   . Morbid obesity (HCC) 02/17/2016  . Polycystic disease, ovaries    Past Surgical History:  Procedure Laterality Date  . NO PAST SURGERIES     Family History  Problem Relation Age of Onset  . Cancer Maternal Grandmother        breast  . Heart disease Maternal Grandfather   . Heart disease Paternal Grandfather   . Alcohol abuse Sister   . ADD / ADHD Sister    Social History   Tobacco Use  . Smoking status: Former Smoker    Types: Cigarettes    Last attempt to quit:  09/28/2016    Years since quitting: 0.6  . Smokeless tobacco: Never Used  Substance Use Topics  . Alcohol use: Yes    Alcohol/week: 3.6 oz    Types: 3 Glasses of wine, 1 Shots of liquor, 2 Cans of beer per week    Comment: occas  . Drug use: Yes    Types: Marijuana    Comment: last used about a month ago    Interim medical history since last visit reviewed. Allergies and medications reviewed  Review of Systems Per HPI unless specifically indicated above     Objective:    BP 120/68 (BP Location: Right Arm, Patient Position: Sitting, Cuff Size: Large)   Pulse 96   Temp 98.9 F (37.2 C) (Oral)   Ht 5\' 3"  (1.6 m)   Wt (!) 304 lb (137.9 kg)   SpO2 99%   BMI 53.85 kg/m   Wt Readings from Last 3 Encounters:  05/17/17 (!) 304 lb (137.9 kg)  03/19/17 (!) 301 lb 6.4 oz (136.7 kg)  02/28/17 (!) 306 lb 9.6 oz (139.1 kg)  Physical Exam  Constitutional:  Morbidly obese female, no distress  Abdominal: She exhibits no distension. There is no tenderness.  Genitourinary:  Genitourinary Comments: Morbid obesity limited the exam; assistant helped hold back the thigh on the right, examiner held back thigh on the left; could not visualize cervix or advance the speculum far because of obesity; scant cervical discharge at introitus; whitish; no odor  Psychiatric:  Good eye contact with examiner    Results for orders placed or performed in visit on 02/28/17  FSH/LH  Result Value Ref Range   LH 3.5 mIU/mL   FSH 4.9 mIU/mL  TSH  Result Value Ref Range   TSH 1.810 0.450 - 4.500 uIU/mL  Progesterone  Result Value Ref Range   Progesterone <0.1 ng/mL  Beta HCG, Quant  Result Value Ref Range   hCG Quant <1 mIU/mL  Estradiol  Result Value Ref Range   Estradiol 55.7 pg/mL  POCT urine pregnancy  Result Value Ref Range   Preg Test, Ur Negative Negative      Assessment & Plan:   Problem List Items Addressed This Visit    None    Visit Diagnoses    Vaginal discharge    -  Primary    wet prep collected   Screening for STD (sexually transmitted disease)       safe sex, knowledge is power, etc   Relevant Orders   Hepatitis panel, acute   HIV antibody   RPR   GC/Chlamydia Probe Amp   Wet prep, genital       Follow up plan: Return if symptoms worsen or fail to improve.  An after-visit summary was printed and given to the patient at check-out.  Please see the patient instructions which may contain other information and recommendations beyond what is mentioned above in the assessment and plan.  No orders of the defined types were placed in this encounter.   Orders Placed This Encounter  Procedures  . GC/Chlamydia Probe Amp  . Wet prep, genital  . Hepatitis panel, acute  . HIV antibody  . RPR

## 2017-05-17 NOTE — Patient Instructions (Addendum)
We'll get labs done today If you have not heard anything from my staff in a week about any orders/referrals/studies from today, please contact us here to follow-up [phone]   Safe Sex Practicing safe sex means taking steps before and during sex to reduce your risk of:  Getting an STD (sexually transmitted disease).  Giving your partner an STD.  Unwanted pregnancy.  How can I practice safe sex?  To practice safe sex:  Limit your sexual partners to only one partner who is having sex with only you.  Avoid using alcohol and recreational drugs before having sex. These substances can affect your judgment.  Before having sex with a new partner: ? Talk to your partner about past partners, past STDs, and drug use. ? You and your partner should be screened for STDs and discuss the results with each other.  Check your body regularly for sores, blisters, rashes, or unusual discharge. If you notice any of these problems, visit your health care provider.  If you have symptoms of an infection or you are being treated for an STD, avoid sexual contact.  While having sex, use a condom. Make sure to: ? Use a condom every time you have vaginal, oral, or anal sex. Both females and males should wear condoms during oral sex. ? Keep condoms in place from the beginning to the end of sexual activity. ? Use a latex condom, if possible. Latex condoms offer the best protection. ? Use only water-based lubricants or oils to lubricate a condom. Using petroleum-based lubricants or oils will weaken the condom and increase the chance that it will break.  See your health care provider for regular screenings, exams, and tests for STDs.  Talk with your health care provider about the form of birth control (contraception) that is best for you.  Get vaccinated against hepatitis B and human papillomavirus (HPV).  If you are at risk of being infected with HIV (human immunodeficiency virus), talk with your health  care provider about taking a prescription medicine to prevent HIV infection. You are considered at risk for HIV if: ? You are a man who has sex with other men. ? You are a heterosexual man or woman who is sexually active with more than one partner. ? You take drugs by injection. ? You are sexually active with a partner who has HIV.  This information is not intended to replace advice given to you by your health care provider. Make sure you discuss any questions you have with your health care provider. Document Released: 06/07/2004 Document Revised: 09/14/2015 Document Reviewed: 03/20/2015 Elsevier Interactive Patient Education  [employer]2018 [employer]

## 2017-05-18 LAB — HEPATITIS PANEL, ACUTE
Hep A IgM: NEGATIVE
Hep B C IgM: NEGATIVE
Hep C Virus Ab: <0.1 s/co ratio (ref 0.0–0.9)
Hepatitis B Surface Ag: NEGATIVE

## 2017-05-18 LAB — GC/CHLAMYDIA PROBE AMP
Chlamydia trachomatis, NAA: NEGATIVE
Neisseria gonorrhoeae by PCR: NEGATIVE

## 2017-05-18 LAB — HIV ANTIBODY (ROUTINE TESTING W REFLEX): HIV Screen 4th Generation wRfx: NONREACTIVE

## 2017-05-18 LAB — RPR: RPR Ser Ql: NONREACTIVE

## 2017-05-20 ENCOUNTER — Telehealth: Payer: Self-pay | Admitting: Family Medicine

## 2017-05-20 NOTE — Telephone Encounter (Signed)
Called [facility], spoke with Gregary SignsSean who states that this test has a 6 day turn around. Provider informed.

## 2017-05-20 NOTE — Telephone Encounter (Signed)
Please check on the wet prep; that is usually back by now Thank you

## 2017-05-21 ENCOUNTER — Other Ambulatory Visit: Payer: Self-pay | Admitting: Family Medicine

## 2017-05-21 MED ORDER — CLINDAMYCIN PHOSPHATE 2 % VA CREA: 1.0000 | TOPICAL_CREAM | Freq: Every day | VAGINAL | 0 refills | Status: DC

## 2017-05-21 NOTE — Progress Notes (Signed)
clinda vag gel to pharmacy

## 2017-05-22 LAB — VAGINITIS/VAGINOSIS, DNA PROBE
Candida Species: NEGATIVE
Gardnerella vaginalis: POSITIVE — AB
Trichomonas vaginosis: NEGATIVE

## 2017-05-22 LAB — WET PREP, GENITAL

## 2017-06-13 ENCOUNTER — Encounter: Payer: Self-pay | Admitting: Certified Nurse Midwife

## 2017-06-13 ENCOUNTER — Ambulatory Visit: Payer: Managed Care, Other (non HMO) | Admitting: Certified Nurse Midwife

## 2017-06-13 VITALS — BP 122/83 | HR 87 | Ht 62.0 in | Wt 299.4 lb

## 2017-06-13 DIAGNOSIS — Z3041 Encounter for surveillance of contraceptive pills: Secondary | ICD-10-CM

## 2017-06-13 DIAGNOSIS — Z6841 Body Mass Index (BMI) 40.0 and over, adult: Secondary | ICD-10-CM

## 2017-06-13 DIAGNOSIS — E282 Polycystic ovarian syndrome: Secondary | ICD-10-CM

## 2017-06-13 LAB — POCT URINE PREGNANCY: Preg Test, Ur: NEGATIVE

## 2017-06-13 MED ORDER — DROSPIRENONE-ETHINYL ESTRADIOL 3-0.02 MG PO TABS
1.0000 | ORAL_TABLET | Freq: Every day | ORAL | 4 refills | Status: DC
Start: 2017-06-13 — End: 2017-07-09

## 2017-06-13 MED ORDER — DROSPIRENONE-ETHINYL ESTRADIOL 3-0.02 MG PO TABS: 1.0000 | ORAL_TABLET | Freq: Every day | ORAL | 11 refills | Status: DC

## 2017-06-13 NOTE — Patient Instructions (Addendum)
Drospirenone; Ethinyl Estradiol tablets What is this medicine? DROSPIRENONE; ETHINYL ESTRADIOL (dro SPY re nown; ETH in il es tra DYE ole) is an oral contraceptive (birth control pill). This medicine combines two types of female hormones, an estrogen and a progestin. It is used to prevent ovulation and pregnancy. This medicine may be used for other purposes; ask your health care provider or pharmacist if you have questions. COMMON BRAND NAME(S): Gianvi, Loryna, Nikki 28-Day, Ocella, Syeda, Vestura, Yasmin, Yaz, Zarah What should I tell my health care provider before I take this medicine? They need to know if you have or ever had any of these conditions: -abnormal vaginal bleeding -adrenal gland disease -blood vessel disease or blood clots -breast, cervical, endometrial, ovarian, liver, or uterine cancer -diabetes -gallbladder disease -heart disease or recent heart attack -high blood pressure -high cholesterol -high potassium level -kidney disease -liver disease -migraine headaches -stroke -systemic lupus erythematosus (SLE) -tobacco smoker -an unusual or allergic reaction to estrogens, progestins, or other medicines, foods, dyes, or preservatives -pregnant or trying to get pregnant -breast-feeding How should I use this medicine? Take this medicine by mouth. To reduce nausea, this medicine may be taken with food. Follow the directions on the prescription label. Take this medicine at the same time each day and in the order directed on the package. Do not take your medicine more often than directed. A patient package insert for the product will be given with each prescription and refill. Read this sheet carefully each time. The sheet may change frequently. Talk to your pediatrician regarding the use of this medicine in children. Special care may be needed. This medicine has been used in female children who have started having menstrual periods. Overdosage: If you think you have taken too  much of this medicine contact a poison control center or emergency room at once. NOTE: This medicine is only for you. Do not share this medicine with others. What if I miss a dose? If you miss a dose, refer to the patient information sheet you received with your medicine for direction. If you miss more than one pill, this medicine may not be as effective and you may need to use another form of birth control. What may interact with this medicine? Do not take this medicine with any of the following medications: -aminoglutethimide -amprenavir, fosamprenavir -atazanavir; cobicistat -anastrozole -bosentan -exemestane -letrozole -metyrapone -testolactone This medicine may also interact with the following medications: -acetaminophen -antiviral medicines for HIV or AIDS -aprepitant -barbiturates -certain antibiotics like rifampin, rifabutin, rifapentine, and possibly penicillins or tetracyclines -certain diuretics like amiloride, spironolactone, triamterene -certain medicines for fungal infections like griseofulvin, ketoconazole, itraconazole -certain medications for high blood pressure or heart conditions like ACE-inhibitors, Angiotensin-II receptor blockers, eplerenone -certain medicines for seizures like carbamazepine, oxcarbazepine, phenobarbital, phenytoin -cholestyramine -cobicistat -corticosteroid like hydrocortisone and prednisolone -cyclosporine -dantrolene -felbamate -grapefruit juice -heparin -lamotrigine -medicines for diabetes, including pioglitazone -modafinil -NSAIDs -potassium supplements -pyrimethamine -raloxifene -St. John's wort -sulfasalazine -tamoxifen -topiramate -thyroid hormones -warfarin his list may not describe all possible interactions. Give your health care provider a list of all the medicines, herbs, non-prescription drugs, or dietary supplements you use. Also tell them if you smoke, drink alcohol, or use illegal drugs. Some items may interact with  your medicine. This list may not describe all possible interactions. Give your health care provider a list of all the medicines, herbs, non-prescription drugs, or dietary supplements you use. Also tell them if you smoke, drink alcohol, or use illegal drugs. Some items may interact with your   medicine. What should I watch for while using this medicine? Visit your doctor or health care professional for regular checks on your progress. You will need a regular breast and pelvic exam and Pap smear while on this medicine. Use an additional method of contraception during the first cycle that you take these tablets. If you have any reason to think you are pregnant, stop taking this medicine right away and contact your doctor or health care professional. If you are taking this medicine for hormone related problems, it may take several cycles of use to see improvement in your condition. Smoking increases the risk of getting a blood clot or having a stroke while you are taking birth control pills, especially if you are more than 22 years old. You are strongly advised not to smoke. This medicine can make your body retain fluid, making your fingers, hands, or ankles swell. Your blood pressure can go up. Contact your doctor or health care professional if you feel you are retaining fluid. This medicine can make you more sensitive to the sun. Keep out of the sun. If you cannot avoid being in the sun, wear protective clothing and use sunscreen. Do not use sun lamps or tanning beds/booths. If you wear contact lenses and notice visual changes, or if the lenses begin to feel uncomfortable, consult your eye care specialist. In some women, tenderness, swelling, or minor bleeding of the gums may occur. Notify your dentist if this happens. Brushing and flossing your teeth regularly may help limit this. See your dentist regularly and inform your dentist of the medicines you are taking. If you are going to have elective surgery,  you may need to stop taking this medicine before the surgery. Consult your health care professional for advice. This medicine does not protect you against HIV infection (AIDS) or any other sexually transmitted diseases. What side effects may I notice from receiving this medicine? Side effects that you should report to your doctor or health care professional as soon as possible: -allergic reactions like skin rash, itching or hives, swelling of the face, lips, or tongue -breast tissue changes or discharge -changes in vision -chest pain -confusion, trouble speaking or understanding -dark urine -general ill feeling or flu-like symptoms -light-colored stools -nausea, vomiting -pain, swelling, warmth in the leg -right upper belly pain -severe headaches -shortness of breath -sudden numbness or weakness of the face, arm or leg -trouble walking, dizziness, loss of balance or coordination -unusual vaginal bleeding -yellowing of the eyes or skin Side effects that usually do not require medical attention (report to your doctor or health care professional if they continue or are bothersome): -acne -Niese spots on the face -change in appetite -change in sexual desire -depressed mood or mood swings -fluid retention and swelling -stomach cramps or bloating -unusually weak or tired -weight gain This list may not describe all possible side effects. Call your doctor for medical advice about side effects. You may report side effects to FDA at 1-800-FDA-1088. Where should I keep my medicine? Keep out of the reach of children. Store at room temperature between 15 and 30 degrees C (59 and 86 degrees F). Throw away any unused medicine after the expiration date. NOTE: This sheet is a summary. It may not cover all possible information. If you have questions about this medicine, talk to your doctor, pharmacist, or health care provider.  2018 Elsevier/Gold Standard (2016-01-20 13:52:56) Polycystic Ovarian  Syndrome Polycystic ovarian syndrome (PCOS) is a common hormonal disorder among women of reproductive age.  In most women with PCOS, many small fluid-filled sacs (cysts) grow on the ovaries, and the cysts are not part of a normal menstrual cycle. PCOS can cause problems with your menstrual periods and make it difficult to get pregnant. It can also cause an increased risk of miscarriage with pregnancy. If it is not treated, PCOS can lead to serious health problems, such as diabetes and heart disease. What are the causes? The cause of PCOS is not known, but it may be the result of a combination of certain factors, such as:  Irregular menstrual cycle.  High levels of certain hormones (androgens).  Problems with the hormone that helps to control blood sugar (insulin resistance).  Certain genes.  What increases the risk? This condition is more likely to develop in women who have a family history of PCOS. What are the signs or symptoms? Symptoms of PCOS may include:  Multiple ovarian cysts.  Infrequent periods or no periods.  Periods that are too frequent or too heavy.  Unpredictable periods.  Inability to get pregnant (infertility) because of not ovulating.  Increased growth of hair on the face, chest, stomach, back, thumbs, thighs, or toes.  Acne or oily skin. Acne may develop during adulthood, and it may not respond to treatment.  Pelvic pain.  Weight gain or obesity.  Patches of thickened and dark [place]on or black skin on the neck, arms, breasts, or thighs (acanthosis nigricans).  Excess hair growth on the face, chest, abdomen, or upper thighs (hirsutism).  How is this diagnosed? This condition is diagnosed based on:  Your medical history.  A physical exam, including a pelvic exam. Your health care provider may look for areas of increased hair growth on your skin.  Tests, such as: ? Ultrasound. This may be used to examine the ovaries and the lining of the uterus  (endometrium) for cysts. ? Blood tests. These may be used to check levels of sugar (glucose), female hormone (testosterone), and female hormones (estrogen and progesterone) in your blood.  How is this treated? There is no cure for PCOS, but treatment can help to manage symptoms and prevent more health problems from developing. Treatment varies depending on:  Your symptoms.  Whether you want to have a baby or whether you need birth control (contraception).  Treatment may include nutrition and lifestyle changes along with:  Progesterone hormone to start a menstrual period.  Birth control pills to help you have regular menstrual periods.  Medicines to make you ovulate, if you want to get pregnant.  Medicine to reduce excessive hair growth.  Surgery, in severe cases. This may involve making small holes in one or both of your ovaries. This decreases the amount of testosterone that your body produces.  Follow these instructions at home:  Take over-the-counter and prescription medicines only as told by your health care provider.  Follow a healthy meal plan. This can help you reduce the effects of PCOS. ? Eat a healthy diet that includes lean proteins, complex carbohydrates, fresh fruits and vegetables, low-fat dairy products, and healthy fats. Make sure to eat enough fiber.  If you are overweight, lose weight as told by your health care provider. ? Losing 10% of your body weight may improve symptoms. ? Your health care provider can determine how much weight loss is best for you and can help you lose weight safely.  Keep all follow-up visits as told by your health care provider. This is important. Contact a health care provider if:  Your symptoms do  not get better with medicine.  You develop new symptoms. This information is not intended to replace advice given to you by your health care provider. Make sure you discuss any questions you have with your health care provider. Document  Released: 08/24/2004 Document Revised: 12/27/2015 Document Reviewed: 10/16/2015 Elsevier Interactive Patient Education  2018 [employer]. Diet for Polycystic Ovarian Syndrome Polycystic ovary syndrome (PCOS) is a disorder of the chemical messengers (hormones) that regulate menstruation. The condition causes important hormones to be out of balance. PCOS can:  Make your periods irregular or stop.  Cause cysts to develop on the ovaries.  Make it difficult to get pregnant.  Stop your body from responding to the effects of insulin (insulin resistance), which can lead to obesity and diabetes.  Changing what you eat can help manage PCOS and improve your health. It can help you lose weight and improve the way your body uses insulin. What is my plan?  Eat breakfast, lunch, and dinner plus two snacks every day.  Include protein in each meal and snack.  Choose whole grains instead of products made with refined flour.  Eat a variety of foods.  Exercise regularly as told by your health care provider. What do I need to know about this eating plan? If you are overweight or obese, pay attention to how many calories you eat. Cutting down on calories can help you lose weight. Work with your health care provider or dietitian to figure out how many calories you need each day. What foods can I eat? Grains Whole grains, such as whole wheat. Whole-grain breads, crackers, cereals, and pasta. Unsweetened oatmeal, bulgur, barley, quinoa, or [place]ias rice. Corn or whole-wheat flour tortillas. Vegetables  Lettuce. Spinach. Peas. Beets. Cauliflower. Cabbage. Broccoli. Carrots. Tomatoes. Squash. Eggplant. Herbs. Peppers. Onions. Cucumbers. Brussels sprouts. Fruits Berries. Bananas. Apples. Oranges. Grapes. Papaya. Mango. Pomegranate. Kiwi. Grapefruit. Cherries. Meats and Other Protein Sources Lean proteins, such as fish, chicken, beans, eggs, and tofu. Dairy Low-fat dairy products, such as skim milk, cheese  sticks, and yogurt. Beverages Low-fat or fat-free drinks, such as water, low-fat milk, sugar-free drinks, and 100% fruit juice. Condiments Ketchup. Mustard. Barbecue sauce. Relish. Low-fat or fat-free mayonnaise. Fats and Oils Olive oil or canola oil. Walnuts and almonds. The items listed above may not be a complete list of recommended foods or beverages. Contact your dietitian for more options. What foods are not recommended? Foods high in calories or fat. Fried foods. Sweets. Products made from refined white flour, including white bread, pastries, white rice, and pasta. The items listed above may not be a complete list of foods and beverages to avoid. Contact your dietitian for more information. This information is not intended to replace advice given to you by your health care provider. Make sure you discuss any questions you have with your health care provider. Document Released: 08/22/2015 Document Revised: 10/06/2015 Document Reviewed: 05/12/2014 Elsevier Interactive Patient Education  2018 [employer].

## 2017-06-15 MED ORDER — METFORMIN HCL 500 MG PO TABS: ORAL_TABLET | ORAL | 5 refills | Status: DC

## 2017-06-15 NOTE — Progress Notes (Signed)
GYN ENCOUNTER NOTE  Subjective:       Katherine Huynh is a 22 y.o.  female here desiring a prescription for oral contraception.   Has used pills and Depo in the past, but would like to stop Depo due to weight gain. Feels like she is more responsible and able to take pills since she does not desire pregnancy.   Denies difficulty breathing or respiratory distress, chest pain, abdominal pain, vaginal bleeding, dysuria, and leg pain or swelling.   History significant for PCOS, no currently taking medications.    Gynecologic History  No LMP recorded. Patient is not currently having periods (Reason: Other).  Contraception: Depo-Provera injections  Last Pap: due.   Past Medical History:  Diagnosis Date  . Depression   . Fam hx-ischem heart disease 09/15/2015  . Heart murmur   . Morbid obesity (HCC) 02/17/2016  . Polycystic disease, ovaries     Past Surgical History:  Procedure Laterality Date  . NO PAST SURGERIES      Current Outpatient Medications on File Prior to Visit  Medication Sig Dispense Refill  . FLUoxetine (PROZAC) 20 MG tablet Take 2 tablets (40 mg total) by mouth daily. 60 tablet 2  . loratadine (CLARITIN) 10 MG tablet Take 1 tablet (10 mg total) by mouth daily as needed for allergies or itching. 30 tablet 11  . metFORMIN (GLUCOPHAGE) 500 MG tablet Take 500 mg by mouth.    . mupirocin ointment (BACTROBAN) 2 % Place 1 application 2 (two) times daily into the nose. (Patient not taking: Reported on 05/17/2017) 30 g 0  . traZODone (DESYREL) 50 MG tablet Take 1 tablet (50 mg total) by mouth at bedtime. (Patient not taking: Reported on 05/17/2017) 30 tablet 1   No current facility-administered medications on file prior to visit.     Allergies  Allergen Reactions  . Cinnamon Itching  . Erythromycin Other (See Comments)    Unknown reaction, happened in childhood  . Sulfa Antibiotics Rash    Social History   Socioeconomic History  . Marital status: Single    Spouse name:  Not on file  . Number of children: Not on file  . Years of education: Not on file  . Highest education level: Not on file  Social Needs  . Financial resource strain: Not on file  . Food insecurity - worry: Not on file  . Food insecurity - inability: Not on file  . Transportation needs - medical: Not on file  . Transportation needs - non-medical: Not on file  Occupational History  . Not on file  Tobacco Use  . Smoking status: Former Smoker    Types: Cigarettes    Last attempt to quit: 09/28/2016    Years since quitting: 0.7  . Smokeless tobacco: Never Used  Substance and Sexual Activity  . Alcohol use: Yes    Alcohol/week: 3.6 oz    Types: 3 Glasses of wine, 1 Shots of liquor, 2 Cans of beer per week    Comment: occas  . Drug use: Yes    Types: Marijuana    Comment: last used about a month ago  . Sexual activity: Yes    Birth control/protection: None  Other Topics Concern  . Not on file  Social History Narrative  . Not on file    Family History  Problem Relation Age of Onset  . Cancer Maternal Grandmother        breast  . Heart disease Maternal Grandfather   . Heart disease Paternal  Grandfather   . Alcohol abuse Sister   . ADD / ADHD Sister     The following portions of the patient's history were reviewed and updated as appropriate: allergies, current medications, past family history, past medical history, past social history, past surgical history and problem list.  Review of Systems  Review of Systems - Negative except as noted above. History obtained from the patient.   Objective:   BP 122/83   Pulse 87   Ht 5\' 2"  (1.575 m)   Wt 299 lb 6.4 oz (135.8 kg)   BMI 54.76 kg/m    General: Alert and oriented x 4, no apparent distress.   UPT negative.   Assessment:   1. Encounter for surveillance of contraceptive pills - POCT urine pregnancy  2. History of Polycystic Ovarian Syndrome  3. BMI 54  Plan:   Reviewed all forms of birth control options  available including abstinence; over the counter/barrier methods; hormonal contraceptive medication including pill, patch, ring, injection,contraceptive implant; hormonal and nonhormonal IUDs. Risks and benefits reviewed.  Questions were answered.  Information was given to patient to review. Patient desires OCP at this time.   Rx: Yaz and Metformin, see orders.   Encouraged to schedule appoint with Endocrinologist ASAP. Will refill Metformin this visit only.   Referral to Nutrition, see orders.   Reviewed red flag symptoms and when to call.    Gunnar BullaJenkins Michelle Shakoya Gilmore, CNM [facility], [facility]

## 2017-07-09 ENCOUNTER — Encounter: Payer: Self-pay | Admitting: Certified Nurse Midwife

## 2017-07-09 ENCOUNTER — Ambulatory Visit: Payer: Managed Care, Other (non HMO) | Admitting: Certified Nurse Midwife

## 2017-07-09 VITALS — BP 121/86 | HR 119 | Ht 63.0 in | Wt 291.5 lb

## 2017-07-09 DIAGNOSIS — Z3202 Encounter for pregnancy test, result negative: Secondary | ICD-10-CM | POA: Diagnosis not present

## 2017-07-09 DIAGNOSIS — R112 Nausea with vomiting, unspecified: Secondary | ICD-10-CM | POA: Diagnosis not present

## 2017-07-09 DIAGNOSIS — R5383 Other fatigue: Secondary | ICD-10-CM | POA: Diagnosis not present

## 2017-07-09 DIAGNOSIS — R232 Flushing: Secondary | ICD-10-CM

## 2017-07-09 NOTE — Progress Notes (Signed)
Pt had a positive home pregnancy test. LMP 06/10/17 very short in duration. Was taking OCPs sporatically. C/o cramping and spotting, being nauseous, fatigued and doesn't feel like herself Would like to know why she feels like this.UPT neg

## 2017-07-09 NOTE — Patient Instructions (Signed)
Diet for Polycystic Ovarian Syndrome Polycystic ovary syndrome (PCOS) is a disorder of the chemical messengers (hormones) that regulate menstruation. The condition causes important hormones to be out of balance. PCOS can:  Make your periods irregular or stop.  Cause cysts to develop on the ovaries.  Make it difficult to get pregnant.  Stop your body from responding to the effects of insulin (insulin resistance), which can lead to obesity and diabetes.  Changing what you eat can help manage PCOS and improve your health. It can help you lose weight and improve the way your body uses insulin. What is my plan?  Eat breakfast, lunch, and dinner plus two snacks every day.  Include protein in each meal and snack.  Choose whole grains instead of products made with refined flour.  Eat a variety of foods.  Exercise regularly as told by your health care provider. What do I need to know about this eating plan? If you are overweight or obese, pay attention to how many calories you eat. Cutting down on calories can help you lose weight. Work with your health care provider or dietitian to figure out how many calories you need each day. What foods can I eat? Grains Whole grains, such as whole wheat. Whole-grain breads, crackers, cereals, and pasta. Unsweetened oatmeal, bulgur, barley, quinoa, or [place] rice. Corn or whole-wheat flour tortillas. Vegetables  Lettuce. Spinach. Peas. Beets. Cauliflower. Cabbage. Broccoli. Carrots. Tomatoes. Squash. Eggplant. Herbs. Peppers. Onions. Cucumbers. Brussels sprouts. Fruits Berries. Bananas. Apples. Oranges. Grapes. Papaya. Mango. Pomegranate. Kiwi. Grapefruit. Cherries. Meats and Other Protein Sources Lean proteins, such as fish, chicken, beans, eggs, and tofu. Dairy Low-fat dairy products, such as skim milk, cheese sticks, and yogurt. Beverages Low-fat or fat-free drinks, such as water, low-fat milk, sugar-free drinks, and 100% fruit  juice. Condiments Ketchup. Mustard. Barbecue sauce. Relish. Low-fat or fat-free mayonnaise. Fats and Oils Olive oil or canola oil. Walnuts and almonds. The items listed above may not be a complete list of recommended foods or beverages. Contact your dietitian for more options. What foods are not recommended? Foods high in calories or fat. Fried foods. Sweets. Products made from refined white flour, including white bread, pastries, white rice, and pasta. The items listed above may not be a complete list of foods and beverages to avoid. Contact your dietitian for more information. This information is not intended to replace advice given to you by your health care provider. Make sure you discuss any questions you have with your health care provider. Document Released: 08/22/2015 Document Revised: 10/06/2015 Document Reviewed: 05/12/2014 Elsevier Interactive Patient Education  2018 Elsevier Inc.  Polycystic Ovarian Syndrome Polycystic ovarian syndrome (PCOS) is a common hormonal disorder among women of reproductive age. In most women with PCOS, many small fluid-filled sacs (cysts) grow on the ovaries, and the cysts are not part of a normal menstrual cycle. PCOS can cause problems with your menstrual periods and make it difficult to get pregnant. It can also cause an increased risk of miscarriage with pregnancy. If it is not treated, PCOS can lead to serious health problems, such as diabetes and heart disease. What are the causes? The cause of PCOS is not known, but it may be the result of a combination of certain factors, such as:  Irregular menstrual cycle.  High levels of certain hormones (androgens).  Problems with the hormone that helps to control blood sugar (insulin resistance).  Certain genes.  What increases the risk? This condition is more likely to develop in women who   have a family history of PCOS. What are the signs or symptoms? Symptoms of PCOS may include:  Multiple ovarian  cysts.  Infrequent periods or no periods.  Periods that are too frequent or too heavy.  Unpredictable periods.  Inability to get pregnant (infertility) because of not ovulating.  Increased growth of hair on the face, chest, stomach, back, thumbs, thighs, or toes.  Acne or oily skin. Acne may develop during adulthood, and it may not respond to treatment.  Pelvic pain.  Weight gain or obesity.  Patches of thickened and dark [place] or black skin on the neck, arms, breasts, or thighs (acanthosis nigricans).  Excess hair growth on the face, chest, abdomen, or upper thighs (hirsutism).  How is this diagnosed? This condition is diagnosed based on:  Your medical history.  A physical exam, including a pelvic exam. Your health care provider may look for areas of increased hair growth on your skin.  Tests, such as: ? Ultrasound. This may be used to examine the ovaries and the lining of the uterus (endometrium) for cysts. ? Blood tests. These may be used to check levels of sugar (glucose), female hormone (testosterone), and female hormones (estrogen and progesterone) in your blood.  How is this treated? There is no cure for PCOS, but treatment can help to manage symptoms and prevent more health problems from developing. Treatment varies depending on:  Your symptoms.  Whether you want to have a baby or whether you need birth control (contraception).  Treatment may include nutrition and lifestyle changes along with:  Progesterone hormone to start a menstrual period.  Birth control pills to help you have regular menstrual periods.  Medicines to make you ovulate, if you want to get pregnant.  Medicine to reduce excessive hair growth.  Surgery, in severe cases. This may involve making small holes in one or both of your ovaries. This decreases the amount of testosterone that your body produces.  Follow these instructions at home:  Take over-the-counter and prescription medicines only  as told by your health care provider.  Follow a healthy meal plan. This can help you reduce the effects of PCOS. ? Eat a healthy diet that includes lean proteins, complex carbohydrates, fresh fruits and vegetables, low-fat dairy products, and healthy fats. Make sure to eat enough fiber.  If you are overweight, lose weight as told by your health care provider. ? Losing 10% of your body weight may improve symptoms. ? Your health care provider can determine how much weight loss is best for you and can help you lose weight safely.  Keep all follow-up visits as told by your health care provider. This is important. Contact a health care provider if:  Your symptoms do not get better with medicine.  You develop new symptoms. This information is not intended to replace advice given to you by your health care provider. Make sure you discuss any questions you have with your health care provider. Document Released: 08/24/2004 Document Revised: 12/27/2015 Document Reviewed: 10/16/2015 Elsevier Interactive Patient Education  2018 Elsevier Inc.  

## 2017-07-09 NOTE — Progress Notes (Signed)
GYN ENCOUNTER NOTE  Subjective:       Katherine Huynh is a 22 y.o. G1P0 femalehere for gynecologic evaluation of the following issues:  1. Positive home pregnancy test 2. Hot flashes 3. Fatigue 4. Nausea and vomiting  Denies difficulty breathing or respiratory distress, chest pain, abdominal pain, vaginal bleeding, dysuria and leg pain or swelling.   She has stopped taking her Metformin and Yaz. She endorses unprotected sex on 06/23/2017. She unsure if she desires treatment of PCOS and/or pregnancy at this time.    Gynecologic History   Patient's last menstrual period was 06/10/2017 (exact date).   Contraception: none  Last Pap: due.   Obstetric History  OB History  Gravida Para Term Preterm AB Living  1            SAB TAB Ectopic Multiple Live Births               # Outcome Date GA Lbr Len/2nd Weight Sex Delivery Anes PTL Lv  1 Gravida               Past Medical History:  Diagnosis Date  . Depression   . Fam hx-ischem heart disease 09/15/2015  . Heart murmur   . Morbid obesity (HCC) 02/17/2016  . Polycystic disease, ovaries     Past Surgical History:  Procedure Laterality Date  . NO PAST SURGERIES      Current Outpatient Medications on File Prior to Visit  Medication Sig Dispense Refill  . loratadine (CLARITIN) 10 MG tablet Take 1 tablet (10 mg total) by mouth daily as needed for allergies or itching. 30 tablet 11   No current facility-administered medications on file prior to visit.     Allergies  Allergen Reactions  . Cinnamon Itching  . Erythromycin Other (See Comments)    Unknown reaction, happened in childhood  . Sulfa Antibiotics Rash    Social History   Socioeconomic History  . Marital status: Single    Spouse name: Not on file  . Number of children: Not on file  . Years of education: Not on file  . Highest education level: Not on file  Social Needs  . Financial resource strain: Not on file  . Food insecurity - worry: Not on file  . Food  insecurity - inability: Not on file  . Transportation needs - medical: Not on file  . Transportation needs - non-medical: Not on file  Occupational History  . Not on file  Tobacco Use  . Smoking status: Former Smoker    Types: Cigarettes    Last attempt to quit: 09/28/2016    Years since quitting: 0.7  . Smokeless tobacco: Never Used  Substance and Sexual Activity  . Alcohol use: Yes    Alcohol/week: 3.6 oz    Types: 3 Glasses of wine, 1 Shots of liquor, 2 Cans of beer per week    Comment: occas  . Drug use: Yes    Types: Marijuana    Comment: last used about a month ago  . Sexual activity: Yes    Birth control/protection: None  Other Topics Concern  . Not on file  Social History Narrative  . Not on file    Family History  Problem Relation Age of Onset  . Cancer Maternal Grandmother        breast  . Heart disease Maternal Grandfather   . Heart disease Paternal Grandfather   . Alcohol abuse Sister   . ADD / ADHD Sister  The following portions of the patient's history were reviewed and updated as appropriate: allergies, current medications, past family history, past medical history, past social history, past surgical history and problem list.  Review of Systems  Review of Systems - Negative except as noted above History obtained from the patient.  Objective:   BP 121/86   Pulse (!) 119   Ht 5\' 3"  (1.6 m)   Wt 291 lb 8 oz (132.2 kg)   LMP 06/10/2017 (Exact Date)   Breastfeeding? Unknown   BMI 51.64 kg/m    Alert and oriented x 4, no apparent distress.   UPT Negative.  Physical exam: not indicated.    Assessment:   1. Nausea and vomiting, intractability of vomiting not specified, unspecified vomiting type  - Hemoglobin A1c - Beta HCG, Quant  2. Pregnancy test negative  - Thyroid Panel With TSH - Hemoglobin A1c - Beta HCG, Quant  3. Hot flashes  - Thyroid Panel With TSH - Hemoglobin A1c - Beta HCG, Quant  4. Fatigue, unspecified type  -  Thyroid Panel With TSH - Hemoglobin A1c - Beta HCG, Quant   Plan:   Advised patient to make decision regarding desire for pregnancy and/or PCOS management options and then contact us for referrals needed.   Labs today, see orders.   Reviewed red flag symptoms and when to call.   RTC for Annual Exam or sooner if needed.    Gunnar Bulla, CNM [facility], [facility]

## 2017-07-10 ENCOUNTER — Other Ambulatory Visit: Payer: Self-pay

## 2017-07-13 ENCOUNTER — Emergency Department
Admission: EM | Admit: 2017-07-13 | Discharge: 2017-07-13 | Disposition: A | Discharge status: Home / Self Care | Payer: Managed Care, Other (non HMO) | Attending: Emergency Medicine | Admitting: Emergency Medicine

## 2017-07-13 ENCOUNTER — Other Ambulatory Visit: Payer: Self-pay

## 2017-07-13 DIAGNOSIS — Z87891 Personal history of nicotine dependence: Secondary | ICD-10-CM | POA: Diagnosis not present

## 2017-07-13 DIAGNOSIS — R1030 Lower abdominal pain, unspecified: Secondary | ICD-10-CM | POA: Insufficient documentation

## 2017-07-13 DIAGNOSIS — N939 Abnormal uterine and vaginal bleeding, unspecified: Secondary | ICD-10-CM | POA: Diagnosis not present

## 2017-07-13 LAB — URINALYSIS, COMPLETE (UACMP) WITH MICROSCOPIC
Bilirubin Urine: NEGATIVE
Glucose, UA: NEGATIVE mg/dL
Ketones, ur: NEGATIVE mg/dL
Nitrite: NEGATIVE
Protein, ur: NEGATIVE mg/dL
Specific Gravity, Urine: 1.026 (ref 1.005–1.030)
pH: 5 (ref 5.0–8.0)

## 2017-07-13 LAB — CBC
HCT: 37.8 % (ref 35.0–47.0)
Hemoglobin: 12.5 g/dL (ref 12.0–16.0)
MCH: 27.1 pg (ref 26.0–34.0)
MCHC: 33.1 g/dL (ref 32.0–36.0)
MCV: 81.7 fL (ref 80.0–100.0)
Platelets: 315 10*3/uL (ref 150–440)
RBC: 4.63 MIL/uL (ref 3.80–5.20)
RDW: 16 % — ABNORMAL HIGH (ref 11.5–14.5)
WBC: 8.3 10*3/uL (ref 3.6–11.0)

## 2017-07-13 LAB — COMPREHENSIVE METABOLIC PANEL
ALT: 21 U/L (ref 14–54)
AST: 18 U/L (ref 15–41)
Albumin: 4 g/dL (ref 3.5–5.0)
Alkaline Phosphatase: 80 U/L (ref 38–126)
Anion gap: 10 (ref 5–15)
BUN: 8 mg/dL (ref 6–20)
CO2: 20 mmol/L — ABNORMAL LOW (ref 22–32)
Calcium: 9.3 mg/dL (ref 8.9–10.3)
Chloride: 109 mmol/L (ref 101–111)
Creatinine, Ser: 0.7 mg/dL (ref 0.44–1.00)
GFR calc Af Amer: >60 mL/min (ref >60)
GFR calc non Af Amer: >60 mL/min (ref >60)
Glucose, Bld: 91 mg/dL (ref 65–99)
Potassium: 3.9 mmol/L (ref 3.5–5.1)
Sodium: 139 mmol/L (ref 135–145)
Total Bilirubin: 0.5 mg/dL (ref 0.3–1.2)
Total Protein: 8.7 g/dL — ABNORMAL HIGH (ref 6.5–8.1)

## 2017-07-13 LAB — TYPE AND SCREEN
ABO/RH(D): O POS
Antibody Screen: NEGATIVE

## 2017-07-13 LAB — HCG, QUANTITATIVE, PREGNANCY: hCG, Beta Chain, Quant, S: <1 m[IU]/mL (ref <5)

## 2017-07-13 LAB — POCT PREGNANCY, URINE: Preg Test, Ur: NEGATIVE

## 2017-07-13 NOTE — ED Notes (Signed)
Patient states she was out moving car. Chart restored.

## 2017-07-13 NOTE — Discharge Instructions (Signed)
Please seek medical attention for any high fevers, chest pain, shortness of breath, change in behavior, persistent vomiting, bloody stool or any other new or concerning symptoms.  

## 2017-07-13 NOTE — ED Notes (Signed)
Called for room, not in waiting room.  

## 2017-07-13 NOTE — ED Notes (Signed)
Final call, not in waiting room.

## 2017-07-13 NOTE — ED Notes (Signed)
Second call for room, not in waiting room.

## 2017-07-13 NOTE — ED Triage Notes (Signed)
Pt states she believes she is having miscarriage. 4th pregnancy, states 3-[redacted] weeks along. States vaginal bleeding started last night, noticed clots. abd cramping. 1 pad every 2 hours. Alert, oriented, ambulatory. No distress noted in triage.

## 2017-07-13 NOTE — ED Provider Notes (Signed)
[facility] Emergency Department Provider Note    ____________________________________________   I have reviewed the triage vital signs and the nursing notes.   HISTORY  Chief Complaint Threatened Miscarriage   History limited by: Not Limited   HPI [name] is a 22 y.o. female who presents to the emergency department today because of concern for vaginal bleeding and cramping. Patient states that symptoms started a couple of days ago. She last had her period on 06/10/2017. Lasted until 06/22/2017. She does have a history of irregular periods. The bleeding has been accompanied by cramping. This cramping is present in the suprapubic region. Took a home pregnancy test roughly 1 week ago which was positive. Denies seeing her ob/gyn since the positive test.   Per medical record review patient has a history of seeing midwife last week and had negative pregnancy test 4 days ago.  Past Medical History:  Diagnosis Date  . Depression   . Fam hx-ischem heart disease 09/15/2015  . Heart murmur   . Morbid obesity (HCC) 02/17/2016  . Polycystic disease, ovaries     Patient Active Problem List   Diagnosis Date Noted  . Murmur, cardiac 10/12/2016  . Polycystic disease, ovaries 10/02/2016  . 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 Morbid obesity (HCC) 02/17/2016  . Difficulty controlling anger 02/17/2016  . Binge eating disorder 02/17/2016  . Athlete's foot, left 10/10/2015  . Personal history of sulfonamide allergy 09/15/2015  . Onychomycosis 09/15/2015  . Fam hx-ischem heart disease 09/15/2015    Past Surgical History:  Procedure Laterality Date  . NO PAST SURGERIES      Prior to Admission medications   Medication Sig Start Date End Date Taking? Authorizing Provider  loratadine (CLARITIN) 10 MG tablet Take 1 tablet (10 mg total) by mouth daily as needed for allergies or itching. 09/15/15   Kerman PasseyLada, Melinda P, MD    Allergies Cinnamon; Erythromycin;  and Sulfa antibiotics  Family History  Problem Relation Age of Onset  . Cancer Maternal Grandmother        breast  . Heart disease Maternal Grandfather   . Heart disease Paternal Grandfather   . Alcohol abuse Sister   . ADD / ADHD Sister     Social History Social History   Tobacco Use  . Smoking status: Former Smoker    Types: Cigarettes    Last attempt to quit: 09/28/2016    Years since quitting: 0.7  . Smokeless tobacco: Never Used  Substance Use Topics  . Alcohol use: Yes    Alcohol/week: 3.6 oz    Types: 3 Glasses of wine, 1 Shots of liquor, 2 Cans of beer per week    Comment: occas  . Drug use: Yes    Types: Marijuana    Comment: last used about a month ago    Review of Systems Constitutional: No fever/chills Eyes: No visual changes. ENT: No sore throat. Cardiovascular: Denies chest pain. Respiratory: Denies shortness of breath. Gastrointestinal: Positive for lower abdominal cramping. Genitourinary: Positive for vaginal bleeding. Musculoskeletal: Negative for back pain. Skin: Negative for rash. Neurological: Negative for headaches, focal weakness or numbness.  ____________________________________________   PHYSICAL EXAM:  VITAL SIGNS: ED Triage Vitals  Enc Vitals Group     BP 07/13/17 1250 125/81     Pulse Rate 07/13/17 1250 83     Resp 07/13/17 1250 18     Temp 07/13/17 1250 99 F (37.2 C)     Temp Source 07/13/17 1250 Oral  SpO2 07/13/17 1250 99 %     Weight 07/13/17 1249 290 lb (131.5 kg)     Height 07/13/17 1249 5\' 3"  (1.6 m)     Head Circumference --      Peak Flow --      Pain Score 07/13/17 1252 7   Constitutional: Alert and oriented. Well appearing and in no distress. Eyes: Conjunctivae are normal.  ENT   Head: Normocephalic and atraumatic.   Nose: No congestion/rhinnorhea.   Mouth/Throat: Mucous membranes are moist.   Neck: No stridor. Hematological/Lymphatic/Immunilogical: No cervical lymphadenopathy. Cardiovascular:  Normal rate, regular rhythm.  No murmurs, rubs, or gallops.  Respiratory: Normal respiratory effort without tachypnea nor retractions. Breath sounds are clear and equal bilaterally. No wheezes/rales/rhonchi. Gastrointestinal: Soft and non tender. No rebound. No guarding.  Genitourinary: Deferred Musculoskeletal: Normal range of motion in all extremities. No lower extremity edema. Neurologic:  Normal speech and language. No gross focal neurologic deficits are appreciated.  Skin:  Skin is warm, dry and intact. No rash noted. Psychiatric: Mood and affect are normal. Speech and behavior are normal. Patient exhibits appropriate insight and judgment.  ____________________________________________    LABS (pertinent positives/negatives)  Upreg negative hcg <1  CBC wnl except rdw 16.0 CMP cr 0.70, glu 91 UA not consistent with infection ____________________________________________   EKG  None  ____________________________________________    RADIOLOGY  None  ____________________________________________   PROCEDURES  Procedures  ____________________________________________   INITIAL IMPRESSION / ASSESSMENT AND PLAN / ED COURSE  Pertinent labs & imaging results that were available during my care of the patient were reviewed by me and considered in my medical decision making (see chart for details).  Patient here with vaginal bleeding and cramping. Pregnancy tests negative which is consistent with negative test performed 4 days ago. Think patient likely having her period. Discussed results with patient.  ____________________________________________   FINAL CLINICAL IMPRESSION(S) / ED DIAGNOSES  Final diagnoses:  Vaginal bleeding     Note: This dictation was prepared with Dragon dictation. Any transcriptional errors that result from this process are unintentional     Phineas Semen, MD 07/13/17 [phone]

## 2017-07-15 ENCOUNTER — Ambulatory Visit: Payer: Self-pay | Admitting: Family Medicine

## 2017-07-26 ENCOUNTER — Encounter: Payer: Self-pay | Admitting: Family Medicine

## 2017-07-26 ENCOUNTER — Ambulatory Visit: Payer: Managed Care, Other (non HMO) | Admitting: Family Medicine

## 2017-07-26 VITALS — BP 126/82 | HR 99 | Temp 98.5°F | Resp 14 | Wt 293.0 lb

## 2017-07-26 DIAGNOSIS — F5081 Binge eating disorder: Secondary | ICD-10-CM

## 2017-07-26 DIAGNOSIS — J111 Influenza due to unidentified influenza virus with other respiratory manifestations: Secondary | ICD-10-CM

## 2017-07-26 DIAGNOSIS — R69 Illness, unspecified: Secondary | ICD-10-CM | POA: Diagnosis not present

## 2017-07-26 DIAGNOSIS — R454 Irritability and anger: Secondary | ICD-10-CM

## 2017-07-26 DIAGNOSIS — F330 Major depressive disorder, recurrent, mild: Secondary | ICD-10-CM

## 2017-07-26 MED ORDER — OSELTAMIVIR PHOSPHATE 75 MG PO CAPS
75.0000 mg | ORAL_CAPSULE | Freq: Two times a day (BID) | ORAL | 0 refills | Status: DC
Start: 2017-07-26 — End: 2017-10-17

## 2017-07-26 MED ORDER — FLUOXETINE HCL 20 MG PO TABS: 20.0000 mg | ORAL_TABLET | Freq: Every day | ORAL | 0 refills | Status: DC

## 2017-07-26 NOTE — Assessment & Plan Note (Signed)
Refer to psychiatrist 

## 2017-07-26 NOTE — Assessment & Plan Note (Addendum)
Refer to psychiatrist; she wishes to start back on SSRI; agrees to seek immediate help for any SI or HI; see AVS for resources provided too

## 2017-07-26 NOTE — Patient Instructions (Addendum)
Please start the new medicine You are contagious so stay away from people until feeling better Out of work until Monday, but call if more time needed Start back on the Prozac  Here are some resources to help you if you feel you are in a mental health crisis:  National Suicide Prevention Lifeline - Call 773 104 [phone]  for help - Website with more resources: ARanked.fihttps://suicidepreventionlifeline.org/  [facility] - Call 331-081-7402878-782-6492 for help. - Mobile Crisis Program available 24 hours a day, 365 days a year. - Available for anyone of any age in [place] & [place] counties.  [facility] - Address: [address], [place] - Telephone: [phone]  - Hours of Operation: Sunday - Saturday - 8:00 a.m. - 8:00 p.m. - Medicaid, Medicare (Government Issued Only), BCBS, and [employer] - Crisis Management, Outpatient Individual & Group Therapy, Psychiatrists on-site to provide medication management, In-Home Psychiatric Care, and Peer Support Care.  [facility] - Call [phone] for help. - Mobile Crisis Program available 24 hours a day, 365 days a year. - Available for anyone of any age in  & [place]    Influenza, Adult Influenza, more commonly known as "the flu," is a viral infection that primarily affects the respiratory tract. The respiratory tract includes organs that help you breathe, such as the lungs, nose, and throat. The flu causes many common cold symptoms, as well as a high fever and body aches. The flu spreads easily from person to person (is contagious). Getting a flu shot (influenza vaccination) every year is the best way to prevent influenza. What are the causes? Influenza is caused by a virus. You can catch the virus by:  Breathing in droplets from an infected person's cough or sneeze.  Touching something that was recently contaminated with the virus and then touching your  mouth, nose, or eyes.  What increases the risk? The following factors may make you more likely to get the flu:  Not cleaning your hands frequently with soap and water or alcohol-based hand sanitizer.  Having close contact with many people during cold and flu season.  Touching your mouth, eyes, or nose without washing or sanitizing your hands first.  Not drinking enough fluids or not eating a healthy diet.  Not getting enough sleep or exercise.  Being under a high amount of stress.  Not getting a yearly (annual) flu shot.  You may be at a higher risk of complications from the flu, such as a severe lung infection (pneumonia), if you:  Are over the age of 22.  Are pregnant.  Have a weakened disease-fighting system (immune system). You may have a weakened immune system if you: ? Have HIV or AIDS. ? Are undergoing chemotherapy. ? Aretaking medicines that reduce the activity of (suppress) the immune system.  Have a long-term (chronic) illness, such as heart disease, kidney disease, diabetes, or lung disease.  Have a liver disorder.  Are obese.  Have anemia.  What are the signs or symptoms? Symptoms of this condition typically last 4-10 days and may include:  Fever.  Chills.  Headache, body aches, or muscle aches.  Sore throat.  Cough.  Runny or congested nose.  Chest discomfort and cough.  Poor appetite.  Weakness or tiredness (fatigue).  Dizziness.  Nausea or vomiting.  How is this diagnosed? This condition may be diagnosed based on your medical history and a physical exam. Your health care provider may do a nose or throat swab test  to confirm the diagnosis. How is this treated? If influenza is detected early, you can be treated with antiviral medicine that can reduce the length of your illness and the severity of your symptoms. This medicine may be given by mouth (orally) or through an IV tube that is inserted in one of your veins. The goal of treatment  is to relieve symptoms by taking care of yourself at home. This may include taking over-the-counter medicines, drinking plenty of fluids, and adding humidity to the air in your home. In some cases, influenza goes away on its own. Severe influenza or complications from influenza may be treated in a hospital. Follow these instructions at home:  Take over-the-counter and prescription medicines only as told by your health care provider.  Use a cool mist humidifier to add humidity to the air in your home. This can make breathing easier.  Rest as needed.  Drink enough fluid to keep your urine clear or pale yellow.  Cover your mouth and nose when you cough or sneeze.  Wash your hands with soap and water often, especially after you cough or sneeze. If soap and water are not available, use hand sanitizer.  Stay home from work or school as told by your health care provider. Unless you are visiting your health care provider, try to avoid leaving home until your fever has been gone for 24 hours without the use of medicine.  Keep all follow-up visits as told by your health care provider. This is important. How is this prevented?  Getting an annual flu shot is the best way to avoid getting the flu. You may get the flu shot in late summer, fall, or winter. Ask your health care provider when you should get your flu shot.  Wash your hands often or use hand sanitizer often.  Avoid contact with people who are sick during cold and flu season.  Eat a healthy diet, drink plenty of fluids, get enough sleep, and exercise regularly. Contact a health care provider if:  You develop new symptoms.  You have: ? Chest pain. ? Diarrhea. ? A fever.  Your cough gets worse.  You produce more mucus.  You feel nauseous or you vomit. Get help right away if:  You develop shortness of breath or difficulty breathing.  Your skin or nails turn a bluish color.  You have severe pain or stiffness in your  neck.  You develop a sudden headache or sudden pain in your face or ear.  You cannot stop vomiting. This information is not intended to replace advice given to you by your health care provider. Make sure you discuss any questions you have with your health care provider. Document Released: 04/27/2000 Document Revised: 10/06/2015 Document Reviewed: 02/22/2015 Elsevier Interactive Patient Education  2017 [employer].

## 2017-07-26 NOTE — Assessment & Plan Note (Signed)
Refer to psych 

## 2017-07-26 NOTE — Progress Notes (Signed)
BP 126/82   Pulse 99   Temp 98.5 F (36.9 C) (Oral)   Resp 14   Wt 293 lb (132.9 kg)   LMP 06/10/2017 (Exact Date)   SpO2 94%   Breastfeeding? Unknown   BMI 51.90 kg/m    Subjective:    Patient ID: Katherine Huynh, female    DOB: 09/05/1995, 22 y.o.   MRN: [id number]  HPI: Katherine Huynh is a 22 y.o. female  Chief Complaint  Patient presents with  . URI    onset 2 day symptoms include feeling hot, cough, tired, headache, drainage and sore throat  . 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 She has tamiflu in the past and it worked fine, no problems  Depression; not a good fit with psychiatrist She says she and her fiance just split; safe; no thoughts of either hurting herself or other people; just hopeless and sadness She was supposed to go back for her refill of the prozac; worked well, no odd thoughts; would like to go back on that; she was on 40 mg  Depression screen [facility] 2[facility] 07/26/2017 05/17/2017 12/25/2016 10/12/2016 04/17/2016  Decreased Interest 0 0 0 0 1  Down, Depressed, Hopeless 3 0 3 0 1  PHQ - 2 Score 3 0 3 0 2  Altered sleeping 3 - 2 - 3  Tired, decreased energy 3 - 2 - 3  Change in appetite 3 - 3 - 3  Feeling bad or failure about yourself  3 - 1 - 0  Trouble concentrating 3 - 1 - 0  Moving slowly or fidgety/restless 0 - 0 - 0  Suicidal thoughts 1 - 0 - 0  PHQ-9 Score 19 - 12 - 11  Difficult doing work/chores Extremely dIfficult - Somewhat  difficult - Somewhat difficult    Relevant past medical, surgical, family and social history reviewed Past Medical History:  Diagnosis Date  . Depression   . Fam hx-ischem heart disease 09/15/2015  . Heart murmur   . Morbid obesity (HCC) 02/17/2016  . Polycystic disease, ovaries    Past Surgical History:  Procedure Laterality Date  . NO PAST SURGERIES     Family History  Problem Relation Age of Onset  . Cancer Maternal Grandmother        breast  . Heart disease Maternal Grandfather   . Heart disease Paternal Grandfather   . Alcohol abuse Sister   . ADD / ADHD Sister    Social History   Tobacco Use  . Smoking status: Former Smoker    Types: Cigarettes    Last attempt to quit: 09/28/2016    Years since quitting: 0.8  . Smokeless tobacco: Never Used  Substance Use Topics  . Alcohol use: Yes    Alcohol/week: 3.6 oz    Types: 3 Glasses of wine, 1 Shots of liquor, 2 Cans of beer per week    Comment: occas  . Drug use: Yes    Types: Marijuana  Comment: last used about a month ago    Interim medical history since last visit reviewed. Allergies and medications reviewed  Review of Systems Per HPI unless specifically indicated above     Objective:    BP 126/82   Pulse 99   Temp 98.5 F (36.9 C) (Oral)   Resp 14   Wt 293 lb (132.9 kg)   LMP 06/10/2017 (Exact Date)   SpO2 94%   Breastfeeding? Unknown   BMI 51.90 kg/m   Wt Readings from Last 3 Encounters:  07/26/17 293 lb (132.9 kg)  07/13/17 290 lb (131.5 kg)  07/09/17 291 lb 8 oz (132.2 kg)    Physical Exam  Constitutional: She appears well-developed and well-nourished. No distress.  HENT:  Head: Normocephalic and atraumatic.  Right Ear: Tympanic membrane and ear canal normal. Tympanic membrane is not erythematous. No middle ear effusion.  Left Ear: Tympanic membrane and ear canal normal. Tympanic membrane is not erythematous.  No middle ear effusion.  Nose: Rhinorrhea present.  Mouth/Throat: No posterior  oropharyngeal edema or posterior oropharyngeal erythema.  Eyes: EOM are normal. No scleral icterus.  Neck: No thyromegaly present.  Cardiovascular: Normal rate, regular rhythm and normal heart sounds.  No murmur heard. Pulmonary/Chest: Effort normal and breath sounds normal. No respiratory distress. She has no wheezes.  Abdominal: She exhibits no distension.  Musculoskeletal: She exhibits no edema.  Lymphadenopathy:    She has no cervical adenopathy.  Neurological: She is alert. She exhibits normal muscle tone.  Skin: Skin is warm and dry. She is not diaphoretic. No pallor.  Psychiatric: Her mood appears not anxious. Her affect is blunt. Her speech is not delayed. She is not slowed, not withdrawn and not actively hallucinating. She does not exhibit a depressed mood.  Good eye contact with examiner; good historian; does not appear despondent; affect a little blunted    Results for orders placed or performed during the hospital encounter of 07/13/17  hCG, quantitative, pregnancy  Result Value Ref Range   hCG, Beta Chain, Quant, S <1 <5 mIU/mL  Comprehensive metabolic panel  Result Value Ref Range   Sodium 139 135 - 145 mmol/L   Potassium 3.9 3.5 - 5.1 mmol/L   Chloride 109 101 - 111 mmol/L   CO2 20 (L) 22 - 32 mmol/L   Glucose, Bld 91 65 - 99 mg/dL   BUN 8 6 - 20 mg/dL   Creatinine, Ser 8.11 0.44 - 1.00 mg/dL   Calcium 9.3 8.9 - 91.4 mg/dL   Total Protein 8.7 (H) 6.5 - 8.1 g/dL   Albumin 4.0 3.5 - 5.0 g/dL   AST 18 15 - 41 U/L   ALT 21 14 - 54 U/L   Alkaline Phosphatase 80 38 - 126 U/L   Total Bilirubin 0.5 0.3 - 1.2 mg/dL   GFR calc non Af Amer >60 >60 mL/min   GFR calc Af Amer >60 >60 mL/min   Anion gap 10 5 - 15  CBC  Result Value Ref Range   WBC 8.3 3.6 - 11.0 K/uL   RBC 4.63 3.80 - 5.20 MIL/uL   Hemoglobin 12.5 12.0 - 16.0 g/dL   HCT 78.2 95.6 - 21.3 %   MCV 81.7 80.0 - 100.0 fL   MCH 27.1 26.0 - 34.0 pg   MCHC 33.1 32.0 - 36.0 g/dL   RDW 08.6 (H) 57.8 - 46.9 %    Platelets 315 150 - 440 K/uL  Urinalysis, Complete w Microscopic  Result Value Ref Range  Color, Urine YELLOW (A) YELLOW   APPearance HAZY (A) CLEAR   Specific Gravity, Urine 1.026 1.005 - 1.030   pH 5.0 5.0 - 8.0   Glucose, UA NEGATIVE NEGATIVE mg/dL   Hgb urine dipstick MODERATE (A) NEGATIVE   Bilirubin Urine NEGATIVE NEGATIVE   Ketones, ur NEGATIVE NEGATIVE mg/dL   Protein, ur NEGATIVE NEGATIVE mg/dL   Nitrite NEGATIVE NEGATIVE   Leukocytes, UA SMALL (A) NEGATIVE   RBC / HPF 0-5 0 - 5 RBC/hpf   WBC, UA 0-5 0 - 5 WBC/hpf   Bacteria, UA RARE (A) NONE SEEN   Squamous Epithelial / LPF 0-5 (A) NONE SEEN   Mucus PRESENT   Pregnancy, urine POC  Result Value Ref Range   Preg Test, Ur NEGATIVE NEGATIVE  Type and screen [facility]  Result Value Ref Range   ABO/RH(D) O POS    Antibody Screen NEG    Sample Expiration      07/16/2017 Performed at [facility], [address], [place], [place] [address]       Assessment & Plan:   Problem List Items Addressed This Visit      Other   Depression, major, recurrent (HCC)    Refer to psychiatrist      Relevant Medications   FLUoxetine (PROZAC) 20 MG tablet   Other Relevant Orders   Ambulatory referral to Psychiatry       Follow up plan: No Follow-up on file.  An after-visit summary was printed and given to the patient at check-out.  Please see the patient instructions which may contain other information and recommendations beyond what is mentioned above in the assessment and plan.  Meds ordered this encounter  Medications  . FLUoxetine (PROZAC) 20 MG tablet    Sig: Take 1 tablet (20 mg total) by mouth daily. For one week, then two a day    Dispense:  53 tablet    Refill:  0  . oseltamivir (TAMIFLU) 75 MG capsule    Sig: Take 1 capsule (75 mg total) by mouth 2 (two) times daily.    Dispense:  10 capsule    Refill:  0    Orders Placed This Encounter  Procedures  . Ambulatory referral to  Psychiatry

## 2017-08-26 ENCOUNTER — Ambulatory Visit: Payer: Self-pay | Admitting: Family Medicine

## 2017-10-01 ENCOUNTER — Encounter: Payer: Self-pay | Admitting: Certified Nurse Midwife

## 2017-10-01 ENCOUNTER — Ambulatory Visit: Payer: Managed Care, Other (non HMO) | Admitting: Certified Nurse Midwife

## 2017-10-01 VITALS — BP 117/90 | HR 83 | Ht 63.0 in | Wt 291.1 lb

## 2017-10-01 DIAGNOSIS — N941 Unspecified dyspareunia: Secondary | ICD-10-CM

## 2017-10-01 DIAGNOSIS — N9089 Other specified noninflammatory disorders of vulva and perineum: Secondary | ICD-10-CM

## 2017-10-01 DIAGNOSIS — N898 Other specified noninflammatory disorders of vagina: Secondary | ICD-10-CM | POA: Diagnosis not present

## 2017-10-01 MED ORDER — FLUCONAZOLE 150 MG PO TABS: 150.0000 mg | ORAL_TABLET | Freq: Once | ORAL | 0 refills | Status: AC

## 2017-10-01 NOTE — Patient Instructions (Addendum)
Dyspareunia, Female Dyspareunia is pain that is associated with sexual activity. This can affect any part of the genitals or lower abdomen, and there are many possible causes. This condition ranges from mild to severe. Depending on the cause, dyspareunia may get better with treatment, or it may return (recur) over time. What are the causes? The cause of this condition is not always known. Possible causes include:  Cancer.  Psychological factors, such as depression, anxiety, or previous traumatic experiences.  Severe pain and tenderness of the skin around the vagina (vulva) when it is touched (vulvar vestibulitis syndrome).  Infection of the pelvis or the vulva.  Infection of the vagina.  Painful, involuntary tightening (contraction) of the vaginal muscles when anything is put inside the vagina (vaginismus).  Allergic reaction.  Ovarian cysts.  Solid growths of tissue (tumors) in the ovaries or the uterus.  Scar tissue in the ovaries, vagina, or pelvis.  Vaginal dryness.  Thinning of the tissue (atrophy) of the vulva and vagina.  Skin conditions that affect the vulva (vulvar dermatoses), such as lichen sclerosus or lichen planus.  Endometriosis.  Tubal pregnancy.  A tilted uterus.  Uterine prolapse.  Adhesions in the vagina.  Bladder problems.  Intestinal problems.  Certain medicines.  Medical conditions such as diabetes, arthritis, or thyroid disease.  What increases the risk? The following factors may make you more likely to develop this condition:  Having experienced physical or sexual trauma.  Having given birth more than once.  Taking birth control pills.  Having gone through menopause.  Having recently given birth, typically within the past 3-6 months.  Breastfeeding.  What are the signs or symptoms? The main symptom of this condition is pain in any part of the genitals or lower abdomen during or after sexual activity. This may include pain  during sexual arousal, genital stimulation, or orgasm. Pain may get worse when anything is inserted into the vagina, or when the genitals are touched in any way, such as when sitting or wearing pants. Pain can range from mild to severe, depending on the cause of the condition. In some cases, symptoms go away with treatment and return (recur) at a later date. How is this diagnosed? This condition may be diagnosed based on:  Your symptoms, including: ? Where your pain is located. ? When your pain occurs.  Your medical history.  A physical exam. This may include a pelvic exam and a Pap test. This is a screening test that is used to check for signs of cancer of the vagina, cervix, and uterus.  Tests, including: ? Blood tests. ? Ultrasound. This uses sound waves to make a picture of the area that is being tested. ? Urine culture. This test involves checking a urine sample for signs of infection. ? Culture test. This is when your health care provider uses a swab to collect a sample of vaginal fluid. The sample is checked for signs of infection. ? X-rays. ? MRI. ? CT scan. ? Laparoscopy. This is a procedure in which a small incision is made in your lower abdomen and a lighted, pencil-sized instrument (laparoscope) is passed through the incision and used to look inside your pelvis.  You may be referred to a health care provider who specializes in women's health (gynecologist). In some cases, diagnosing the cause of dyspareunia can be difficult. How is this treated? Treatment depends on the cause of your condition and your symptoms. In most cases, you may need to stop sexual activity until your symptoms   improve. Treatment may include:  Lubricants.  Kegel exercises or vaginal dilators.  Medicated skin creams.  Medicated vaginal creams.  Hormonal therapy.  Antibiotic medicine to prevent or fight infection.  Medicines that help to relieve pain.  Medicines that treat depression  (antidepressants).  Psychological counseling.  Sex therapy.  Surgery.  Follow these instructions at home: Lifestyle  Avoid tight clothing and irritating materials around your genital and abdominal area.  Use water-based lubricants as needed. Avoid oil-based lubricants.  Do not use any products that irritate you. This may include certain condoms, spermicides, lubricants, soaps, tampons, vaginal sprays, or douches.  Always practice safe sex. Talk with your health care provider about which form of birth control (contraception) is best for you.  Maintain open communication with your sexual partner. General instructions  Take over-the-counter and prescription medicines only as told by your health care provider.  If you had tests done, it is your responsibility to get your tests results. Ask your health care provider or the department performing the test when your results will be ready.  Urinate before you engage in sexual activity.  Consider joining a support group.  Keep all follow-up visits as told by your health care provider. This is important. Contact a health care provider if:  You develop vaginal bleeding after sexual intercourse.  You develop a lump at the opening of your vagina. Seek medical care even if the lump is painless.  You have: ? Abnormal vaginal discharge. ? Vaginal dryness. ? Itchiness or irritation of your vulva or vagina. ? A new rash. ? Symptoms that get worse or do not improve with treatment. ? A fever. ? Pain when you urinate. ? Blood in your urine. Get help right away if:  You develop severe pain in your abdomen during or shortly after sexual intercourse.  You pass out after having sexual intercourse. This information is not intended to replace advice given to you by your health care provider. Make sure you discuss any questions you have with your health care provider. Document Released: 05/20/2007 Document Revised: 09/09/2015 Document  Reviewed: 11/30/2014 Elsevier Interactive Patient Education  2018 [employer].  Vaginitis Vaginitis is an inflammation of the vagina. It can happen when the normal bacteria and yeast in the vagina grow too much. There are different types. Treatment will depend on the type you have. Follow these instructions at home:  Take all medicines as told by your doctor.  Keep your vagina area clean and dry. Avoid soap. Rinse the area with water.  Avoid washing and cleaning out the vagina (douching).  Do not use tampons or have sex (intercourse) until your treatment is done.  Wipe from front to back after going to the restroom.  Wear cotton underwear.  Avoid wearing underwear while you sleep until your vaginitis is gone.  Avoid tight pants. Avoid underwear or nylons without a cotton panel.  Take off wet clothing (such as a bathing suit) as soon as you can.  Use mild, unscented products. Avoid fabric softeners and scented: ? Feminine sprays. ? Laundry detergents. ? Tampons. ? Soaps or bubble baths.  Practice safe sex and use condoms. Get help right away if:  You have belly (abdominal) pain.  You have a fever or lasting symptoms for more than 2-3 days.  You have a fever and your symptoms suddenly get worse. This information is not intended to replace advice given to you by your health care provider. Make sure you discuss any questions you have with your health  care provider. Document Released: 07/27/2008 Document Revised: 10/06/2015 Document Reviewed: 10/11/2011 Elsevier Interactive Patient Education  2017 [employer].

## 2017-10-01 NOTE — Progress Notes (Signed)
GYN ENCOUNTER NOTE  Subjective:       Katherine Huynh is a 22 y.o. G63P0 female here for gynecologic evaluation of the following issues:  1. Vaginal pain and swelling with intercourse  Symptoms started approximately two (2) weeks ago. Same partner for the last year, but newly monogamous. No STI exposure. Reports pain with insertion and deep thrusting. Single episode of thick, white vaginal discharge last week.   History significant for change to dial body wash prior to start of symptoms.   Denies difficulty breathing or respiratory distress, chest pain, abdominal pain, excessive vaginal bleeding, dysuria, and leg pain or swelling.   Has not follow up with Endocrinology referral yet.    Gynecologic History  No LMP recorded (lmp unknown).  Contraception: none  Last Pap: due.  Obstetric History  OB History  Gravida Para Term Preterm AB Living  2            SAB TAB Ectopic Multiple Live Births               # Outcome Date GA Lbr Len/2nd Weight Sex Delivery Anes PTL Lv  2 [place]           1 [place]             Past Medical History:  Diagnosis Date  . Depression   . Fam hx-ischem heart disease 09/15/2015  . Heart murmur   . Morbid obesity (HCC) 02/17/2016  . Polycystic disease, ovaries     Past Surgical History:  Procedure Laterality Date  . NO PAST SURGERIES      Current Outpatient Medications on File Prior to Visit  Medication Sig Dispense Refill  . loratadine (CLARITIN) 10 MG tablet Take 1 tablet (10 mg total) by mouth daily as needed for allergies or itching. 30 tablet 11  . drospirenone-ethinyl estradiol (YAZ,GIANVI,LORYNA) 3-0.02 MG tablet Take 1 tablet by mouth daily.    Marland Kitchen FLUoxetine (PROZAC) 20 MG tablet Take 1 tablet (20 mg total) by mouth daily. For one week, then two a day (Patient not taking: Reported on 10/01/2017) 53 tablet 0  . oseltamivir (TAMIFLU) 75 MG capsule Take 1 capsule (75 mg total) by mouth 2 (two) times daily. (Patient not taking: Reported on  10/01/2017) 10 capsule 0   No current facility-administered medications on file prior to visit.     Allergies  Allergen Reactions  . Cinnamon Itching  . Erythromycin Other (See Comments)    Unknown reaction, happened in childhood  . Sulfa Antibiotics Rash    Social History   Socioeconomic History  . Marital status: Single    Spouse name: Not on file  . Number of children: Not on file  . Years of education: Not on file  . Highest education level: Not on file  Occupational History  . Not on file  Social Needs  . Financial resource strain: Not on file  . Food insecurity:    Worry: Not on file    Inability: Not on file  . Transportation needs:    Medical: Not on file    Non-medical: Not on file  Tobacco Use  . Smoking status: Former Smoker    Types: Cigarettes    Last attempt to quit: 09/28/2016    Years since quitting: 1.0  . Smokeless tobacco: Never Used  Substance and Sexual Activity  . Alcohol use: Yes    Alcohol/week: 3.6 oz    Types: 3 Glasses of wine, 2 Cans of beer, 1 Shots of liquor per  week    Comment: occas  . Drug use: Not Currently    Types: Marijuana    Comment: last used about a month ago  . Sexual activity: Yes    Birth control/protection: None  Lifestyle  . Physical activity:    Days per week: Not on file    Minutes per session: Not on file  . Stress: Not on file  Relationships  . Social connections:    Talks on phone: Not on file    Gets together: Not on file    Attends religious service: Not on file    Active member of club or organization: Not on file    Attends meetings of clubs or organizations: Not on file    Relationship status: Not on file  . Intimate partner violence:    Fear of current or ex partner: Not on file    Emotionally abused: Not on file    Physically abused: Not on file    Forced sexual activity: Not on file  Other Topics Concern  . Not on file  Social History Narrative  . Not on file    Family History  Problem  Relation Age of Onset  . Cancer Maternal Grandmother        breast  . Heart disease Maternal Grandfather   . Heart disease Paternal Grandfather   . Alcohol abuse Sister   . ADD / ADHD Sister     The following portions of the patient's history were reviewed and updated as appropriate: allergies, current medications, past family history, past medical history, past social history, past surgical history and problem list.  Review of Systems  Review of Systems - Negative except as noted above.  History obtained from the patient.   Objective:   BP 117/90   Pulse 83   Ht  (1.6 m)   Wt 291 lb 2 oz (132.1 kg)   LMP  (LMP Unknown)   BMI 51.57 kg/m    CONSTITUTIONAL: Well-developed, well-nourished female in no acute distress.   PELVIC:  External Genitalia: Normal, moisture present  Vagina: White discharge present  Cervix: Normal  Uterus: Normal size, shape,consistency, mobile  Adnexa: Unable to palpate due to habitus  Assessment:   1. Dyspareunia in female  - NuSwab Vaginitis (VG)  2. Vaginal discharge  - NuSwab Vaginitis (VG)  3. Vulvar irritation  - NuSwab Vaginitis (VG)   Plan:   Discussed vaginal health techniques; handouts given.   Rx: Diflcuan, see orders.   Labs: NuSwab, will contact patient via MyChart with results.   Reviewed red flag symptoms and when to call.   Encouraged to follow up with Endocrinology.   RTC as needed.    Gunnar Bulla, CNM [facility], [facility]

## 2017-10-01 NOTE — Progress Notes (Signed)
Pt is here with c/o pain and swelling with intercourse.

## 2017-10-03 LAB — NUSWAB VAGINITIS (VG)
Candida albicans, NAA: NEGATIVE
Candida glabrata, NAA: NEGATIVE
Trich vag by NAA: NEGATIVE

## 2017-10-17 ENCOUNTER — Ambulatory Visit (INDEPENDENT_AMBULATORY_CARE_PROVIDER_SITE_OTHER): Payer: Managed Care, Other (non HMO) | Admitting: Obstetrics and Gynecology

## 2017-10-17 ENCOUNTER — Encounter: Payer: Self-pay | Admitting: Obstetrics and Gynecology

## 2017-10-17 ENCOUNTER — Other Ambulatory Visit: Payer: Self-pay | Admitting: Obstetrics and Gynecology

## 2017-10-17 VITALS — BP 112/84 | HR 87 | Ht 63.0 in | Wt 288.7 lb

## 2017-10-17 DIAGNOSIS — E282 Polycystic ovarian syndrome: Secondary | ICD-10-CM

## 2017-10-17 DIAGNOSIS — Z01419 Encounter for gynecological examination (general) (routine) without abnormal findings: Secondary | ICD-10-CM

## 2017-10-17 NOTE — Progress Notes (Signed)
Subjective:     Katherine Huynh is a single black  22 y.o. female and is here for a comprehensive physical exam. 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 The patient reports problems - no menses since January.  Social History   Socioeconomic History  . Marital status: Single    Spouse name: Not on file  . Number of children: Not on file  . Years of education: Not on file  . Highest education level: Not on file  Occupational History  . Not on file  Social Needs  . Financial resource strain: Not on file  . Food insecurity:    Worry: Not on file    Inability: Not on file  . Transportation needs:    Medical: Not on file    Non-medical: Not on file  Tobacco Use  . Smoking status: Former Smoker    Types: Cigarettes    Last attempt to quit: 09/28/2016    Years since quitting: 1.0  . Smokeless tobacco: Never Used  Substance and Sexual Activity  . Alcohol use: Yes    Alcohol/week: 3.6 oz    Types: 3 Glasses of wine, 2 Cans of beer, 1 Shots of liquor per week    Comment: occas  . Drug use: Not Currently    Types: Marijuana    Comment: last used about a month ago  . Sexual activity: Yes    Birth control/protection: None  Lifestyle  . Physical activity:    Days per week: Not on file    Minutes per session: Not on file  . Stress: Not on file  Relationships  . Social connections:    Talks on phone: Not on file    Gets together: Not on file    Attends religious service: Not on file    Active member of club or organization: Not on file    Attends meetings of clubs or organizations: Not on file    Relationship status: Not on file  . Intimate partner violence:    Fear of current or ex partner: Not on file    Emotionally abused: Not on file    Physically abused: Not on file    Forced sexual activity: Not on file  Other Topics Concern  . Not on file  Social History Narrative  . Not on file   Health  Maintenance  Topic Date Due  . CHLAMYDIA SCREENING  06/22/2010  . PAP SMEAR  06/22/2016  . INFLUENZA VACCINE  01/17/2018 (Originally 12/12/2017)  . TETANUS/TDAP  10/13/2026  . HIV Screening  Completed    The following portions of the patient's history were reviewed and updated as appropriate: allergies, current medications, past family history, past medical history, past social history, past surgical history and problem list.  Review of Systems Pertinent items noted in HPI and remainder of comprehensive ROS otherwise negative.   Objective:    General appearance: alert, cooperative, appears stated age and morbidly obese Neck: no adenopathy, no carotid bruit, no JVD, supple, symmetrical, trachea midline and thyroid not enlarged, symmetric, no tenderness/mass/nodules Lungs: clear to auscultation bilaterally Breasts: normal appearance, no masses or tenderness Heart: regular rate and rhythm, S1, S2 normal, no murmur, click, rub or gallop Abdomen: soft, non-tender; bowel sounds normal; no masses,  no organomegaly Pelvic: cervix normal in appearance, external genitalia normal, no adnexal masses or tenderness, no cervical motion tenderness, rectovaginal septum normal, uterus normal size, shape, and consistency and vagina normal  without discharge    Assessment:    Healthy female exam. Morbid obesity, PCOS, irregular menses     Plan:  Labs obtained- will follow up accordingly Instructed to have follow up visit with endocrinologist with in a month(has not been back since initial visit) Encouraged safe sex practices.  RTC in 1 year or as needed.  Melody Shambley,CNM   See After Visit Summary for Counseling Recommendations

## 2017-10-17 NOTE — Patient Instructions (Signed)
Preventive Care 18-39 Years, Female Preventive care refers to lifestyle choices and visits with your health care provider that can promote health and wellness. What does preventive care include?  A yearly physical exam. This is also called an annual well check.  Dental exams once or twice a year.  Routine eye exams. Ask your health care provider how often you should have your eyes checked.  Personal lifestyle choices, including: ? Daily care of your teeth and gums. ? Regular physical activity. ? Eating a healthy diet. ? Avoiding tobacco and drug use. ? Limiting alcohol use. ? Practicing safe sex. ? Taking vitamin and mineral supplements as recommended by your health care provider. What happens during an annual well check? The services and screenings done by your health care provider during your annual well check will depend on your age, overall health, lifestyle risk factors, and family history of disease. Counseling Your health care provider may ask you questions about your:  Alcohol use.  Tobacco use.  Drug use.  Emotional well-being.  Home and relationship well-being.  Sexual activity.  Eating habits.  Work and work Statistician.  Method of birth control.  Menstrual cycle.  Pregnancy history.  Screening You may have the following tests or measurements:  Height, weight, and BMI.  Diabetes screening. This is done by checking your blood sugar (glucose) after you have not eaten for a while (fasting).  Blood pressure.  Lipid and cholesterol levels. These may be checked every 5 years starting at age 38.  Skin check.  Hepatitis C blood test.  Hepatitis B blood test.  Sexually transmitted disease (STD) testing.  BRCA-related cancer screening. This may be done if you have a family history of breast, ovarian, tubal, or peritoneal cancers.  Pelvic exam and Pap test. This may be done every 3 years starting at age 38. Starting at age 30, this may be done  every 5 years if you have a Pap test in combination with an HPV test.  Discuss your test results, treatment options, and if necessary, the need for more tests with your health care provider. Vaccines Your health care provider may recommend certain vaccines, such as:  Influenza vaccine. This is recommended every year.  Tetanus, diphtheria, and acellular pertussis (Tdap, Td) vaccine. You may need a Td booster every 10 years.  Varicella vaccine. You may need this if you have not been vaccinated.  HPV vaccine. If you are 39 or younger, you may need three doses over 6 months.  Measles, mumps, and rubella (MMR) vaccine. You may need at least one dose of MMR. You may also need a second dose.  Pneumococcal 13-valent conjugate (PCV13) vaccine. You may need this if you have certain conditions and were not previously vaccinated.  Pneumococcal polysaccharide (PPSV23) vaccine. You may need one or two doses if you smoke cigarettes or if you have certain conditions.  Meningococcal vaccine. One dose is recommended if you are age 68-21 years and a first-year college student living in a residence hall, or if you have one of several medical conditions. You may also need additional booster doses.  Hepatitis A vaccine. You may need this if you have certain conditions or if you travel or work in places where you may be exposed to hepatitis A.  Hepatitis B vaccine. You may need this if you have certain conditions or if you travel or work in places where you may be exposed to hepatitis B.  Haemophilus influenzae type b (Hib) vaccine. You may need this  if you have certain risk factors.  Talk to your health care provider about which screenings and vaccines you need and how often you need them. This information is not intended to replace advice given to you by your health care provider. Make sure you discuss any questions you have with your health care provider. Document Released: 06/26/2001 Document Revised:  01/18/2016 Document Reviewed: 03/01/2015 Elsevier Interactive Patient Education  2018 [employer]  

## 2017-10-18 ENCOUNTER — Other Ambulatory Visit: Payer: Managed Care, Other (non HMO)

## 2017-10-18 LAB — CYTOLOGY - PAP

## 2017-10-20 LAB — COMPREHENSIVE METABOLIC PANEL
ALT: 16 IU/L (ref 0–32)
AST: 16 IU/L (ref 0–40)
Albumin/Globulin Ratio: 1.1 — ABNORMAL LOW (ref 1.2–2.2)
Albumin: 4 g/dL (ref 3.5–5.5)
Alkaline Phosphatase: 98 IU/L (ref 39–117)
BUN/Creatinine Ratio: 11 (ref 9–23)
BUN: 9 mg/dL (ref 6–20)
Bilirubin Total: 0.2 mg/dL (ref 0.0–1.2)
CO2: 24 mmol/L (ref 20–29)
Calcium: 9.2 mg/dL (ref 8.7–10.2)
Chloride: 104 mmol/L (ref 96–106)
Creatinine, Ser: 0.83 mg/dL (ref 0.57–1.00)
GFR calc Af Amer: 116 mL/min/{1.73_m2} (ref >59)
GFR calc non Af Amer: 100 mL/min/{1.73_m2} (ref >59)
Globulin, Total: 3.5 g/dL (ref 1.5–4.5)
Glucose: 85 mg/dL (ref 65–99)
Potassium: 4.2 mmol/L (ref 3.5–5.2)
Sodium: 141 mmol/L (ref 134–144)
Total Protein: 7.5 g/dL (ref 6.0–8.5)

## 2017-10-20 LAB — TESTOSTERONE, FREE, TOTAL, SHBG
Sex Hormone Binding: 44.3 nmol/L (ref 24.6–122.0)
Testosterone, Free: 1.1 pg/mL (ref 0.0–4.2)
Testosterone: 37 ng/dL (ref 8–48)

## 2017-10-20 LAB — LIPID PANEL
Chol/HDL Ratio: 3.8 ratio (ref 0.0–4.4)
Cholesterol, Total: 164 mg/dL (ref 100–199)
HDL: 43 mg/dL (ref >39)
LDL Calculated: 97 mg/dL (ref 0–99)
Triglycerides: 121 mg/dL (ref 0–149)
VLDL Cholesterol Cal: 24 mg/dL (ref 5–40)

## 2017-10-20 LAB — THYROID PANEL WITH TSH
Free Thyroxine Index: 1.6 (ref 1.2–4.9)
T3 Uptake Ratio: 22 % — ABNORMAL LOW (ref 24–39)
T4, Total: 7.3 ug/dL (ref 4.5–12.0)
TSH: 1.21 u[IU]/mL (ref 0.450–4.500)

## 2017-10-20 LAB — HEMOGLOBIN A1C
Est. average glucose Bld gHb Est-mCnc: 111 mg/dL
Hgb A1c MFr Bld: 5.5 % (ref 4.8–5.6)

## 2017-10-20 LAB — INSULIN, RANDOM: INSULIN: 185 u[IU]/mL — ABNORMAL HIGH (ref 2.6–24.9)

## 2017-10-20 LAB — PROGESTERONE: Progesterone: 4 ng/mL

## 2017-10-20 LAB — FOLLICLE STIMULATING HORMONE: FSH: 1.8 m[IU]/mL

## 2017-10-20 LAB — BETA HCG QUANT (REF LAB): hCG Quant: <1 m[IU]/mL

## 2017-10-20 LAB — DHEA-SULFATE: DHEA-SO4: 323.2 ug/dL (ref 110.0–431.7)

## 2017-10-27 ENCOUNTER — Encounter: Payer: Self-pay | Admitting: Family Medicine

## 2017-11-06 ENCOUNTER — Ambulatory Visit (INDEPENDENT_AMBULATORY_CARE_PROVIDER_SITE_OTHER): Payer: 59 | Admitting: Psychiatry

## 2017-11-06 ENCOUNTER — Encounter: Payer: Self-pay | Admitting: Psychiatry

## 2017-11-06 ENCOUNTER — Other Ambulatory Visit: Payer: Self-pay

## 2017-11-06 VITALS — BP 142/82 | HR 93 | Temp 98.6°F | Ht 63.0 in | Wt 290.4 lb

## 2017-11-06 DIAGNOSIS — F411 Generalized anxiety disorder: Secondary | ICD-10-CM

## 2017-11-06 DIAGNOSIS — F331 Major depressive disorder, recurrent, moderate: Secondary | ICD-10-CM | POA: Diagnosis not present

## 2017-11-06 DIAGNOSIS — F5105 Insomnia due to other mental disorder: Secondary | ICD-10-CM

## 2017-11-06 DIAGNOSIS — F410 Panic disorder [episodic paroxysmal anxiety] without agoraphobia: Secondary | ICD-10-CM | POA: Diagnosis not present

## 2017-11-06 MED ORDER — BREXPIPRAZOLE 0.5 MG PO TABS: 0.5000 mg | ORAL_TABLET | Freq: Every day | ORAL | 1 refills | Status: DC

## 2017-11-06 MED ORDER — SERTRALINE HCL 25 MG PO TABS: 25.0000 mg | ORAL_TABLET | Freq: Every day | ORAL | 1 refills | Status: DC

## 2017-11-06 MED ORDER — HYDROXYZINE HCL 25 MG PO TABS: 25.0000 mg | ORAL_TABLET | Freq: Three times a day (TID) | ORAL | 1 refills | Status: DC | PRN

## 2017-11-06 NOTE — Patient Instructions (Signed)
Hydroxyzine capsules or tablets What is this medicine? HYDROXYZINE (hye [place] i zeen) is an antihistamine. This medicine is used to treat allergy symptoms. It is also used to treat anxiety and tension. This medicine can be used with other medicines to induce sleep before surgery. This medicine may be used for other purposes; ask your health care provider or pharmacist if you have questions. COMMON BRAND NAME(S): ANX, Atarax, Rezine, Vistaril What should I tell my health care provider before I take this medicine? They need to know if you have any of these conditions: -any chronic illness -difficulty passing urine -glaucoma -heart disease -kidney disease -liver disease -lung disease -an unusual or allergic reaction to hydroxyzine, cetirizine, other medicines, foods, dyes, or preservatives -pregnant or trying to get pregnant -breast-feeding How should I use this medicine? Take this medicine by mouth with a full glass of water. Follow the directions on the prescription label. You may take this medicine with food or on an empty stomach. Take your medicine at regular intervals. Do not take your medicine more often than directed. Talk to your pediatrician regarding the use of this medicine in children. Special care may be needed. While this drug may be prescribed for children as young as 75 years of age for selected conditions, precautions do apply. Patients over 62 years old may have a stronger reaction and need a smaller dose. Overdosage: If you think you have taken too much of this medicine contact a poison control center or emergency room at once. NOTE: This medicine is only for you. Do not share this medicine with others. What if I miss a dose? If you miss a dose, take it as soon as you can. If it is almost time for your next dose, take only that dose. Do not take double or extra doses. What may interact with this medicine? -alcohol -barbiturate medicines for sleep or seizures -medicines for  colds, allergies -medicines for depression, anxiety, or emotional disturbances -medicines for pain -medicines for sleep -muscle relaxants This list may not describe all possible interactions. Give your health care provider a list of all the medicines, herbs, non-prescription drugs, or dietary supplements you use. Also tell them if you smoke, drink alcohol, or use illegal drugs. Some items may interact with your medicine. What should I watch for while using this medicine? Tell your doctor or health care professional if your symptoms do not improve. You may get drowsy or dizzy. Do not drive, use machinery, or do anything that needs mental alertness until you know how this medicine affects you. Do not stand or sit up quickly, especially if you are an older patient. This reduces the risk of dizzy or fainting spells. Alcohol may interfere with the effect of this medicine. Avoid alcoholic drinks. Your mouth may get dry. Chewing sugarless gum or sucking hard candy, and drinking plenty of water may help. Contact your doctor if the problem does not go away or is severe. This medicine may cause dry eyes and blurred vision. If you wear contact lenses you may feel some discomfort. Lubricating drops may help. See your eye doctor if the problem does not go away or is severe. If you are receiving skin tests for allergies, tell your doctor you are using this medicine. What side effects may I notice from receiving this medicine? Side effects that you should report to your doctor or health care professional as soon as possible: -fast or irregular heartbeat -difficulty passing urine -seizures -slurred speech or confusion -tremor Side effects that  usually do not require medical attention (report to your doctor or health care professional if they continue or are bothersome): -constipation -drowsiness -fatigue -headache -stomach upset This list may not describe all possible side effects. Call your doctor for  medical advice about side effects. You may report side effects to FDA at 1-800-FDA-1088. Where should I keep my medicine? Keep out of the reach of children. Store at room temperature between 15 and 30 degrees C (59 and 86 degrees F). Keep container tightly closed. Throw away any unused medicine after the expiration date. NOTE: This sheet is a summary. It may not cover all possible information. If you have questions about this medicine, talk to your doctor, pharmacist, or health care provider.  2018 [employer] Standard (2007-09-12 14:50:59) Brexpiprazole oral tablets What is this medicine? BREXPIPRAZOLE (brex PIP ray zole) is an antipsychotic. It is used to treat schizophrenia. This medicine is also used in combination with antidepressants to treat major depressive disorder. This medicine may be used for other purposes; ask your health care provider or pharmacist if you have questions. COMMON BRAND NAME(S): REXULTI What should I tell my health care provider before I take this medicine? They need to know if you have any of these conditions: -dementia -diabetes -difficulty swallowing -heart disease -high cholesterol or high levels of triglycerides in the blood -history of stroke -kidney disease -liver disease -low blood counts, like low white cell, platelet, or red cell counts -Parkinson's disease -seizures -suicidal thoughts, plans, or attempt; a previous suicide attempt by you or a family member -an unusual or allergic reaction to bexpiprazole, other medicines, foods, dyes, or preservatives -pregnant or trying to get pregnant -breast-feeding How should I use this medicine? Take this medicine by mouth with a glass of water. Follow the directions on the prescription label. You can take this medicine with or without food. Take your doses at regular intervals. Do not take your medicine more often than directed. Do not stop taking except on the advice of your doctor or health care  professional. A special MedGuide will be given to you by the pharmacist with each prescription and refill. Be sure to read this information carefully each time. Talk to your pediatrician regarding the use of this medicine in children. Special care may be needed. Overdosage: If you think you have taken too much of this medicine contact a poison control center or emergency room at once. NOTE: This medicine is only for you. Do not share this medicine with others. What if I miss a dose? If you miss a dose, take it as soon as you can. If it is almost time for your next dose, take only that dose. Do not take double or extra doses. What may interact with this medicine? Do not take this medicine with any of the following medications: -aripiprazole -metoclopramide This medicine may also interact with the following medications: -clarithromycin -certain medicines for blood pressure -certain medicines for fungal infections like fluconazole, itraconazole, ketoconazole -duloxetine -fluoxetine -paroxetine -quinidine -rifampin -St. John's Wort This list may not describe all possible interactions. Give your health care provider a list of all the medicines, herbs, non-prescription drugs, or dietary supplements you use. Also tell them if you smoke, drink alcohol, or use illegal drugs. Some items may interact with your medicine. What should I watch for while using this medicine? Visit your doctor or health care professional for regular checks on your progress. It may be several weeks before you see the full effects of this medicine. Notify your doctor  or health care professional if your symptoms get worse, if you have new symptoms, if you are having an unusual effect from this medicine, or if you feel out of control, very discouraged or think you might harm yourself or others. Do not suddenly stop taking this medicine. You may need to gradually reduce the dose. Ask your doctor or health care professional for  advice. You may get dizzy or drowsy. Do not drive, use machinery, or do anything that needs mental alertness until you know how this medicine affects you. Do not stand or sit up quickly, especially if you are an older patient. This reduces the risk of dizzy or fainting spells. Alcohol can increase dizziness and drowsiness. Avoid alcoholic drinks. This medicine may cause dry eyes and blurred vision. If you wear contact lenses you may feel some discomfort. Lubricating drops may help. See your eye doctor if the problem does not go away or is severe. This medicine can reduce the response of your body to heat or cold. Dress warm in cold weather and stay hydrated in hot weather. If possible, avoid extreme temperatures like saunas, hot tubs, very hot or cold showers, or activities that can cause dehydration such as vigorous exercise. What side effects may I notice from receiving this medicine? Side effects that you should report to your doctor or health care professional as soon as possible: -allergic reactions like skin rash, itching or hives, swelling of the face, lips, or tongue -breathing problems -confusion -feeling faint or lightheaded, falls -fever or chills, sore throat -increased hunger or thirst -increased urination -problems with balance, talking, walking -restlessness or need to keep moving -seizures -suicidal thoughts or other mood changes -trouble swallowing -uncontrollable head, mouth, neck, arm, or leg movements -unusually weak or tired Side effects that usually do not require medical attention (report to your doctor or health care professional if they continue or are bothersome): -constipation -drowsiness -headache -stomach upset -weight gain This list may not describe all possible side effects. Call your doctor for medical advice about side effects. You may report side effects to FDA at 1-800-FDA-1088. Where should I keep my medicine? Keep out of the reach of children. Store  at room temperature between 15 and 30 degrees C (59 and 86 degrees F). Throw away any unused medicine after the expiration date. NOTE: This sheet is a summary. It may not cover all possible information. If you have questions about this medicine, talk to your doctor, pharmacist, or health care provider.  2018 [employer]/Gold Standard (2015-06-02 09:17:04) Sertraline tablets What is this medicine? SERTRALINE (SER tra leen) is used to treat depression. It may also be used to treat obsessive compulsive disorder, panic disorder, post-trauma stress, premenstrual dysphoric disorder (PMDD) or social anxiety. This medicine may be used for other purposes; ask your health care provider or pharmacist if you have questions. COMMON BRAND NAME(S): Zoloft What should I tell my health care provider before I take this medicine? They need to know if you have any of these conditions: -bleeding disorders -bipolar disorder or a family history of bipolar disorder -glaucoma -heart disease -high blood pressure -history of irregular heartbeat -history of low levels of calcium, magnesium, or potassium in the blood -if you often drink alcohol -liver disease -receiving electroconvulsive therapy -seizures -suicidal thoughts, plans, or attempt; a previous suicide attempt by you or a family member -take medicines that treat or prevent blood clots -thyroid disease -an unusual or allergic reaction to sertraline, other medicines, foods, dyes, or preservatives -pregnant or trying  to get pregnant -breast-feeding How should I use this medicine? Take this medicine by mouth with a glass of water. Follow the directions on the prescription label. You can take it with or without food. Take your medicine at regular intervals. Do not take your medicine more often than directed. Do not stop taking this medicine suddenly except upon the advice of your doctor. Stopping this medicine too quickly may cause serious side effects or your  condition may worsen. A special MedGuide will be given to you by the pharmacist with each prescription and refill. Be sure to read this information carefully each time. Talk to your pediatrician regarding the use of this medicine in children. While this drug may be prescribed for children as young as 7 years for selected conditions, precautions do apply. Overdosage: If you think you have taken too much of this medicine contact a poison control center or emergency room at once. NOTE: This medicine is only for you. Do not share this medicine with others. What if I miss a dose? If you miss a dose, take it as soon as you can. If it is almost time for your next dose, take only that dose. Do not take double or extra doses. What may interact with this medicine? Do not take this medicine with any of the following medications: -cisapride -dofetilide -dronedarone -linezolid -MAOIs like Carbex, Eldepryl, Marplan, Nardil, and Parnate -methylene blue (injected into a vein) -pimozide -thioridazine This medicine may also interact with the following medications: -alcohol -amphetamines -aspirin and aspirin-like medicines -certain medicines for depression, anxiety, or psychotic disturbances -certain medicines for fungal infections like ketoconazole, fluconazole, posaconazole, and itraconazole -certain medicines for irregular heart beat like flecainide, quinidine, propafenone -certain medicines for migraine headaches like almotriptan, eletriptan, frovatriptan, naratriptan, rizatriptan, sumatriptan, zolmitriptan -certain medicines for sleep -certain medicines for seizures like carbamazepine, valproic acid, phenytoin -certain medicines that treat or prevent blood clots like warfarin, enoxaparin, dalteparin -cimetidine -digoxin -diuretics -fentanyl -isoniazid -lithium -NSAIDs, medicines for pain and inflammation, like ibuprofen or naproxen -other medicines that prolong the QT interval (cause an abnormal  heart rhythm) -rasagiline -safinamide -supplements like St. John's wort, kava kava, valerian -tolbutamide -tramadol -tryptophan This list may not describe all possible interactions. Give your health care provider a list of all the medicines, herbs, non-prescription drugs, or dietary supplements you use. Also tell them if you smoke, drink alcohol, or use illegal drugs. Some items may interact with your medicine. What should I watch for while using this medicine? Tell your doctor if your symptoms do not get better or if they get worse. Visit your doctor or health care professional for regular checks on your progress. Because it may take several weeks to see the full effects of this medicine, it is important to continue your treatment as prescribed by your doctor. Patients and their families should watch out for new or worsening thoughts of suicide or depression. Also watch out for sudden changes in feelings such as feeling anxious, agitated, panicky, irritable, hostile, aggressive, impulsive, severely restless, overly excited and hyperactive, or not being able to sleep. If this happens, especially at the beginning of treatment or after a change in dose, call your health care professional. Bonita Quin may get drowsy or dizzy. Do not drive, use machinery, or do anything that needs mental alertness until you know how this medicine affects you. Do not stand or sit up quickly, especially if you are an older patient. This reduces the risk of dizzy or fainting spells. Alcohol may interfere with the  effect of this medicine. Avoid alcoholic drinks. Your mouth may get dry. Chewing sugarless gum or sucking hard candy, and drinking plenty of water may help. Contact your doctor if the problem does not go away or is severe. What side effects may I notice from receiving this medicine? Side effects that you should report to your doctor or health care professional as soon as possible: -allergic reactions like skin rash, itching  or hives, swelling of the face, lips, or tongue -anxious -black, tarry stools -changes in vision -confusion -elevated mood, decreased need for sleep, racing thoughts, impulsive behavior -eye pain -fast, irregular heartbeat -feeling faint or lightheaded, falls -feeling agitated, angry, or irritable -hallucination, loss of contact with reality -loss of balance or coordination -loss of memory -painful or prolonged erections -restlessness, pacing, inability to keep still -seizures -stiff muscles -suicidal thoughts or other mood changes -trouble sleeping -unusual bleeding or bruising -unusually weak or tired -vomiting Side effects that usually do not require medical attention (report to your doctor or health care professional if they continue or are bothersome): -change in appetite or weight -change in sex drive or performance -diarrhea -increased sweating -indigestion, nausea -tremors This list may not describe all possible side effects. Call your doctor for medical advice about side effects. You may report side effects to FDA at 1-800-FDA-1088. Where should I keep my medicine? Keep out of the reach of children. Store at room temperature between 15 and 30 degrees C (59 and 86 degrees F). Throw away any unused medicine after the expiration date. NOTE: This sheet is a summary. It may not cover all possible information. If you have questions about this medicine, talk to your doctor, pharmacist, or health care provider.  2018 [employer] Standard (2016-05-04 14:17:49)

## 2017-11-06 NOTE — Progress Notes (Signed)
Psychiatric Initial Adult Assessment   Patient Identification: Katherine Huynh MRN:  [id number] Date of Evaluation:  11/06/2017 Referral Source: Baruch Gouty MD Chief Complaint: ' I am here to re- establish care.'  Chief Complaint    Establish Care     Visit Diagnosis:    ICD-10-CM   1. MDD (major depressive disorder), recurrent episode, moderate (HCC) F33.1   2. GAD (generalized anxiety disorder) F41.1   3. Panic attacks F41.0   4. Insomnia due to mental condition F51.05     History of Present Illness:  Katherine Huynh is a 22 year old African-American female, unemployed, lives in [place] with her parents, has a history of depression, anxiety, PCOS, and to the clinic today to establish care.  Patient used to follow up with this clinic in the past.  She reports she stopped coming here since she was not comfortable talking about certain incidents in her life  that happened during that time.  She reports she had a breakup with her boyfriend at that time and she did not want to discuss that and hence stopped coming to the clinic.  Patient's last visit with Dr. Daleen Bo here at the clinic was in October 2018.  Patient reports worsening depressive symptoms since the past few weeks.  Patient reports sadness, anhedonia, appetite changes, sleep problems and so on since the past several days.  Patient reports her appetite varies.  She reports she goes without food for a few days and then eats a lot.  Patient denies any suicidal thoughts.  She however reports she does have a history of self-injurious thoughts and actions.  She used to cut herself in the past to feel the pain.  The last time she did it was a year ago.  She reports she is usually able to distract herself.  She copes by going for a walk.  Patient reports anxiety symptoms.  She reports she is a Product/process development scientist, worries about everything to the extreme.  She reports her anxiety symptoms is getting worse since the past few weeks.  She reports she recently had a  panic attack while at work.  She reports she was working at [employer] and had to walk out of her job due to panic symptoms.  She reports feeling impending doom, racing heart rate, chest pain and so on.  She reports this has happened several times in the past and she has walked out on several jobs in the past.  Patient reports sleep as restless.  She reports she cries in her sleep.  She does not remember crying however her fianc tells her.  She does not remember dreams or nightmares when she wakes up in the morning.  She denies any history of trauma.  Patient does report a history of impulsivity, being sexually promiscuous.  She reports she has cheated on her fiance  several times in the past.  She does not know why she does that.  Patient also reports feeling extremely fearful and paranoid when she is home alone.  She reports she witnessed someone at her window 4 years ago and hence is very paranoid when she is at home.  She reports she has to make sure there is no one else in the house with her several times since she feels afraid.  Patient reports good support system from her parents with whom she lives.    Associated Signs/Symptoms: Depression Symptoms:  depressed mood, anhedonia, insomnia, difficulty concentrating, hopelessness, anxiety, (Hypo) Manic Symptoms:  denies Anxiety Symptoms:  Excessive Worry, Panic Symptoms, Psychotic  Symptoms:  Paranoia, PTSD Symptoms: Negative  Past Psychiatric History: Pt reports a hx of SI as well as suicide attempt by OD on pills at the age of 22 y . Pt reports she however stayed at home and slept for two days and did not get help. Pt reports self injurious thoughts and actions by cutting self to feel the pain a year ago. Pt used to see Dr.Ravi and [name] ( therapist) here at [facility] in the past.   Previous Psychotropic Medications: Yes prozac, trazodone ( did not work) , Associate Professorbuspar.  Substance Abuse History in the last 12 months:  No.  Consequences of  Substance Abuse: Negative  Past Medical History:  Past Medical History:  Diagnosis Date  . Depression   . Fam hx-ischem heart disease 09/15/2015  . Heart murmur   . Morbid obesity (HCC) 02/17/2016  . Polycystic disease, ovaries     Past Surgical History:  Procedure Laterality Date  . NO PAST SURGERIES      Family Psychiatric History: Sister - ADD.  Family History:  Family History  Problem Relation Age of Onset  . Cancer Maternal Grandmother        breast  . Heart disease Maternal Grandfather   . Heart disease Paternal Grandfather   . Alcohol abuse Sister   . ADD / ADHD Sister     Social History:   Social History   Socioeconomic History  . Marital status: Single    Spouse name: Not on file  . Number of children: 0  . Years of education: Not on file  . Highest education level: High school graduate  Occupational History  . Not on file  Social Needs  . Financial resource strain: Not on file  . Food insecurity:    Worry: Never true    Inability: Never true  . Transportation needs:    Medical: No    Non-medical: No  Tobacco Use  . Smoking status: Former Smoker    Types: Cigarettes    Last attempt to quit: 09/28/2016    Years since quitting: 1.1  . Smokeless tobacco: Never Used  Substance and Sexual Activity  . Alcohol use: Yes    Alcohol/week: 3.6 oz    Types: 3 Glasses of wine, 2 Cans of beer, 1 Shots of liquor per week    Comment: occas  . Drug use: Not Currently    Types: Marijuana    Comment: last used about a month ago  . Sexual activity: Yes    Birth control/protection: None  Lifestyle  . Physical activity:    Days per week: 0 days    Minutes per session: 0 min  . Stress: Not on file  Relationships  . Social connections:    Talks on phone: Not on file    Gets together: Not on file    Attends religious service: Never    Active member of club or organization: No    Attends meetings of clubs or organizations: Never    Relationship status: Never  married  Other Topics Concern  . Not on file  Social History Narrative  . Not on file    Additional Social History: Pt [place] parents in [place]. Pt is currently unemployed.Pt has a f[place] , they see each other few times a month. Pt has three sisters.   Allergies:   Allergies  Allergen Reactions  . Cinnamon Itching  . Erythromycin Other (See Comments)    Unknown reaction, happened  in childhood  . Sulfa Antibiotics Rash    Metabolic Disorder Labs: Lab Results  Component Value Date   HGBA1C 5.5 10/18/2017   Lab Results  Component Value Date   PROLACTIN 7.6 06/20/2016   Lab Results  Component Value Date   CHOL 164 10/18/2017   TRIG 121 10/18/2017   HDL 43 10/18/2017   CHOLHDL 3.8 10/18/2017   VLDL 19 02/17/2016   LDLCALC 97 10/18/2017   LDLCALC 102 (H) 06/20/2016     Current Medications: Current Outpatient Medications  Medication Sig Dispense Refill  . loratadine (CLARITIN) 10 MG tablet Take 1 tablet (10 mg total) by mouth daily as needed for allergies or itching. 30 tablet 11  . Brexpiprazole (REXULTI) 0.5 MG TABS Take 1 tablet (0.5 mg total) by mouth at bedtime. 30 tablet 1  . hydrOXYzine (ATARAX/VISTARIL) 25 MG tablet Take 1 tablet (25 mg total) by mouth 3 (three) times daily as needed for anxiety (AND AT BEDTIME AS NEEDED FOR SLEEP). 90 tablet 1  . sertraline (ZOLOFT) 25 MG tablet Take 1 tablet (25 mg total) by mouth daily with breakfast. 30 tablet 1   No current facility-administered medications for this visit.     Neurologic: Headache: No Seizure: No Paresthesias:No  Musculoskeletal: Strength & Muscle Tone: within normal limits Gait & Station: normal Patient leans: N/A  Psychiatric Specialty Exam: Review of Systems  Psychiatric/Behavioral: Positive for depression. The patient is nervous/anxious and has insomnia.   All other systems reviewed and are negative.   Blood pressure (!) 142/82, pulse 93,  temperature 98.6 F (37 C), temperature source Oral, height 5\' 3"  (1.6 m), weight 290 lb 6.4 oz (131.7 kg), last menstrual period 10/17/2017, unknown if currently breastfeeding.Body mass index is 51.44 kg/m.  General Appearance: Casual  Eye Contact:  Fair  Speech:  Clear and Coherent  Volume:  Normal  Mood:  Anxious and Dysphoric  Affect:  Congruent  Thought Process:  Goal Directed and Descriptions of Associations: Intact  Orientation:  Full (Time, Place, and Person)  Thought Content:  Logical  Suicidal Thoughts:  No  Homicidal Thoughts:  No  Memory:  Immediate;   Fair Recent;   Fair Remote;   Fair  Judgement:  Fair  Insight:  Fair  Psychomotor Activity:  Normal  Concentration:  Concentration: Fair and Attention Span: Fair  Recall:  [employer] of Knowledge:Fair  Language: Fair  Akathisia:  No  Handed:  Right  AIMS (if indicated):  NA  Assets:  Communication Skills Desire for Improvement Social Support  ADL's:  Intact  Cognition: WNL  Sleep:  Restless    Treatment Plan Summary:Gift is a 22 year old African-American female who is unemployed, engaged, lives in [place] with her parents, has a history of depression, anxiety, insomnia,PCOS , presented to the clinic today to establish care.  Patient used to follow up with this clinic in the past however was noncompliant with her appointments as well as medications.  Patient reports worsening anxiety and depressive symptoms recently and decided to get help.  Patient does have psychosocial stressors of her health problems.  She is also unable to keep a job due to her mental health problems.  Discussed medication management as well as referral for psychotherapy.  Plan as noted below. Medication management and Plan as noted below  Plan  MDD PHQ 9 equals 26 Zoloft 25 mg p.o. Daily. Will also add Rexulti 0.5 mg po qhs for possible paranoia as well as to augment zoloft. Referral for psychotherapy with Ms.  Felecia Jan.  For  GAD/panic attacks Zoloft 25 mg p.o. daily Add hydroxyzine 25 mg p.o. 3 times daily as needed for severe anxiety symptoms. GAD 7 - 23  For insomnia Discussed sleep hygiene techniques Discussed to start melatonin OTC.  Reviewed medical records in [facility] Per Dr. Daleen Bo and Ms. Peacock.  Follow-up in clinic in 6 weeks.  More than 50 % of the time was spent for psychoeducation and supportive psychotherapy and care coordination.  This note was generated in part or whole with voice recognition software. Voice recognition is usually quite accurate but there are transcription errors that can and very often do occur. I apologize for any typographical errors that were not detected and corrected.       Jomarie Longs, MD 6/26/20192:57 PM

## 2017-12-04 ENCOUNTER — Other Ambulatory Visit: Payer: Self-pay

## 2017-12-04 ENCOUNTER — Ambulatory Visit: Payer: Managed Care, Other (non HMO) | Admitting: Family Medicine

## 2017-12-04 ENCOUNTER — Other Ambulatory Visit: Payer: Self-pay | Admitting: Family Medicine

## 2017-12-04 ENCOUNTER — Encounter: Payer: Self-pay | Admitting: Family Medicine

## 2017-12-04 VITALS — BP 124/78 | HR 100 | Temp 98.6°F | Resp 14 | Ht 63.0 in | Wt 297.4 lb

## 2017-12-04 DIAGNOSIS — R309 Painful micturition, unspecified: Secondary | ICD-10-CM

## 2017-12-04 DIAGNOSIS — R350 Frequency of micturition: Secondary | ICD-10-CM | POA: Diagnosis not present

## 2017-12-04 DIAGNOSIS — R319 Hematuria, unspecified: Secondary | ICD-10-CM

## 2017-12-04 DIAGNOSIS — Z113 Encounter for screening for infections with a predominantly sexual mode of transmission: Secondary | ICD-10-CM

## 2017-12-04 LAB — POCT URINALYSIS DIPSTICK
Bilirubin, UA: NEGATIVE
Glucose, UA: NEGATIVE
Ketones, UA: NEGATIVE
Nitrite, UA: NEGATIVE
Odor: NORMAL
Protein, UA: POSITIVE — AB
Spec Grav, UA: 1.02 (ref 1.010–1.025)
Urobilinogen, UA: 0.2 E.U./dL
pH, UA: 5.5 (ref 5.0–8.0)

## 2017-12-04 LAB — POCT URINE PREGNANCY: Preg Test, Ur: NEGATIVE

## 2017-12-04 MED ORDER — NITROFURANTOIN MONOHYD MACRO 100 MG PO CAPS: 100.0000 mg | ORAL_CAPSULE | Freq: Two times a day (BID) | ORAL | 0 refills | Status: DC

## 2017-12-04 NOTE — Progress Notes (Signed)
BP 124/78   Pulse 100   Temp 98.6 F (37 C) (Oral)   Resp 14   Ht 5\' 3"  (1.6 m)   Wt 297 lb 6.4 oz (134.9 kg)   LMP 11/24/2017   SpO2 96%   BMI 52.68 kg/m    Subjective:    Patient ID: Katherine Huynh, female    DOB: 11-24-1995, 22 y.o.   MRN: [id number]  HPI: Katherine Huynh is a 22 y.o. female  Chief Complaint  Patient presents with  . Urinary Tract Infection    frequency and burning may also have yeast infection? discharge 2 days ago but has since cleared up.    HPI  UTI Symptoms Symptoms have been ongoing for 3 days.  Patient endorses dysuria, urinary frequency and urgency, suprapubic pain, and hematuria  Patient denies fevers, chills, flank pain, CVA tenderness, nausea or vomiting, fatigue. Patient endorses vaginal discharge- white cottage cheese type consistency for about 2 days and then self-resolved- has had no discharge for the past two days; denies foul odor, itching or pain.  Patient has not had any new sexual partners. States is sexually active, no protection Patient has a history of UTIs. Had a long trip and was not able to urinate as often; good water drinker Has hx of BV; not real odor Did not use OTC yeast infection medicine Obesity; chronic issue; seeing a psychotherapist; covers issues with food; not bad eating habits; her body holds on to weight she says; she works harder to lose any weight ; family members are heavy set, tiny until childbirth and then gained weight  Depression screen [facility] 2/9 12/04/2017 07/26/2017 05/17/2017 12/25/2016 10/12/2016  Decreased Interest 1 0 0 0 0  Down, Depressed, Hopeless 3 3 0 3 0  PHQ - 2 Score 4 3 0 3 0  Altered sleeping 3 3 - 2 -  Tired, decreased energy 3 3 - 2 -  Change in appetite 1 3 - 3 -  Feeling bad or failure about yourself  1 3 - 1 -  Trouble concentrating 2 3 - 1 -  Moving slowly or fidgety/restless 0 0 - 0 -  Suicidal thoughts 0 1 - 0 -  PHQ-9 Score 14 19 - 12 -  Difficult doing work/chores Not difficult at all  Extremely dIfficult - Somewhat difficult -    Relevant past medical, surgical, family and social history reviewed Past Medical History:  Diagnosis Date  . Depression   . Fam hx-ischem heart disease 09/15/2015  . Heart murmur   . Morbid obesity (HCC) 02/17/2016  . Polycystic disease, ovaries    Past Surgical History:  Procedure Laterality Date  . NO PAST SURGERIES     Family History  Problem Relation Age of Onset  . Cancer Maternal Grandmother        breast  . Heart disease Maternal Grandfather   . Heart disease Paternal Grandfather   . Alcohol abuse Sister   . ADD / ADHD Sister    Social History   Tobacco Use  . Smoking status: Former Smoker    Types: Cigarettes    Last attempt to quit: 09/28/2016    Years since quitting: 1.1  . Smokeless tobacco: Never Used  Substance Use Topics  . Alcohol use: Yes    Alcohol/week: 3.6 oz    Types: 3 Glasses of wine, 2 Cans of beer, 1 Shots of liquor per week    Comment: occas  . Drug use: Not Currently    Types: Marijuana  Comment: last used about a month ago    Interim medical history since last visit reviewed. Allergies and medications reviewed  Review of Systems Per HPI unless specifically indicated above     Objective:    BP 124/78   Pulse 100   Temp 98.6 F (37 C) (Oral)   Resp 14   Ht 5\' 3"  (1.6 m)   Wt 297 lb 6.4 oz (134.9 kg)   LMP 11/24/2017   SpO2 96%   BMI 52.68 kg/m   Wt Readings from Last 3 Encounters:  12/04/17 297 lb 6.4 oz (134.9 kg)  10/17/17 288 lb 11.2 oz (131 kg)  10/01/17 291 lb 2 oz (132.1 kg)    Physical Exam  Constitutional: She appears well-developed and well-nourished.  HENT:  Mouth/Throat: Mucous membranes are normal.  Eyes: EOM are normal. No scleral icterus.  Cardiovascular: Normal rate and regular rhythm.  Pulmonary/Chest: Effort normal and breath sounds normal.  Abdominal: There is no tenderness.  Psychiatric: She has a normal mood and affect. Her behavior is normal.     Results for orders placed or performed in visit on 12/04/17  POCT urinalysis dipstick  Result Value Ref Range   Color, UA drk yellow    Clarity, UA clear    Glucose, UA Negative Negative   Bilirubin, UA neg    Ketones, UA neg    Spec Grav, UA 1.020 1.010 - 1.025   Blood, UA trace    pH, UA 5.5 5.0 - 8.0   Protein, UA Positive (A) Negative   Urobilinogen, UA 0.2 0.2 or 1.0 E.U./dL   Nitrite, UA neg    Leukocytes, UA Large (3+) (A) Negative   Appearance clear    Odor normal   POCT urine pregnancy  Result Value Ref Range   Preg Test, Ur Negative Negative      Assessment & Plan:   Problem List Items Addressed This Visit      Other   Morbid obesity (HCC)    Discussed; consider psych reasons for excess weight; she is working with her psychotherapist; offered referral to bariatric surgeon if she desires; see AVS       Other Visit Diagnoses    Urinary frequency    -  Primary   likely UTI; see urine dip; culture pending; start macrobid; see AVS   Relevant Orders   POCT urinalysis dipstick (Completed)   POCT urine pregnancy (Completed)   Urine Culture   Pain with urination       likely UTI   Relevant Orders   POCT urinalysis dipstick (Completed)   Urine Culture   Screen for STD (sexually transmitted disease)       self collection for wet prep; hiv testing   Relevant Orders   Cervicovaginal ancillary only   HIV antibody (with reflex)   GC/Chlamydia Probe Amp([facility])   WET PREP FOR TRICH, YEAST, CLUE       Follow up plan: No follow-ups on file.  An after-visit summary was printed and given to the patient at check-out.  Please see the patient instructions which may contain other information and recommendations beyond what is mentioned above in the assessment and plan.  Meds ordered this encounter  Medications  . nitrofurantoin, macrocrystal-monohydrate, (MACROBID) 100 MG capsule    Sig: Take 1 capsule (100 mg total) by mouth 2 (two) times daily.    Dispense:   6 capsule    Refill:  0    Orders Placed This Encounter  Procedures  .  Urine Culture  . GC/Chlamydia Probe Amp([facility])  . WET PREP FOR TRICH, YEAST, CLUE  . HIV antibody (with reflex)  . POCT urinalysis dipstick  . POCT urine pregnancy

## 2017-12-04 NOTE — Patient Instructions (Addendum)
Start the antibiotics Please do eat yogurt or kimchi or take a probiotic daily for the next month We want to replace the healthy germs in the gut If you notice foul, watery diarrhea in the next two months, schedule an appointment RIGHT AWAY or go to an urgent care or the emergency room if a holiday or over a weekend Stay hydrated Safe sex practices Check out the information at familydoctor.org entitled "Nutrition for Weight Loss: What You Need to Know about Fad Diets" Try to lose between 1-2 pounds per week by taking in fewer calories and burning off more calories You can succeed by limiting portions, limiting foods dense in calories and fat, becoming more active, and drinking 8 glasses of water a day (64 ounces) Don't skip meals, especially breakfast, as skipping meals may alter your metabolism Do not use over-the-counter weight loss pills or gimmicks that claim rapid weight loss A healthy BMI (or body mass index) is between 18.5 and 24.9 You can calculate your ideal BMI at the NIH website JobEconomics.huhttp://www.nhlbi.nih.gov/health/educational/lose_wt/BMI/bmicalc.htm  Preventing Unhealthy Weight Gain, Adult Staying at a healthy weight is important. When fat builds up in your body, you may become overweight or obese. These conditions put you at greater risk for developing certain health problems, such as heart disease, diabetes, sleeping problems, joint problems, and some cancers. Unhealthy weight gain is often the result of making unhealthy choices in what you eat. It is also a result of not getting enough exercise. You can make changes to your lifestyle to prevent obesity and stay as healthy as possible. What nutrition changes can be made? To maintain a healthy weight and prevent obesity:  Eat only as much as your body needs. To do this: ? Pay attention to signs that you are hungry or full. Stop eating as soon as you feel full. ? If you feel hungry, try drinking water first. Drink enough water so your  urine is clear or pale yellow. ? Eat smaller portions. ? Look at serving sizes on food labels. Most foods contain more than one serving per container. ? Eat the recommended amount of calories for your gender and activity level. While most active people should eat around 2,000 calories per day, if you are trying to lose weight or are not very active, you main need to eat less calories. Talk to your health care provider or dietitian about how many calories you should eat each day.  Choose healthy foods, such as: ? Fruits and vegetables. Try to fill at least half of your plate at each meal with fruits and vegetables. ? Whole grains, such as whole wheat bread, [place] rice, and quinoa. ? Lean meats, such as chicken or fish. ? Other healthy proteins, such as beans, eggs, or tofu. ? Healthy fats, such as nuts, seeds, fatty fish, and olive oil. ? Low-fat or fat-free dairy.  Check food labels and avoid food and drinks that: ? Are high in calories. ? Have added sugar. ? Are high in sodium. ? Have saturated fats or trans fats.  Limit how much you eat of the following foods: ? Prepackaged meals. ? Fast food. ? Fried foods. ? Processed meat, such as bacon, sausage, and deli meats. ? Fatty cuts of red meat and poultry with skin.  Cook foods in healthier ways, such as by baking, broiling, or grilling.  When grocery shopping, try to shop around the outside of the store. This helps you buy mostly fresh foods and avoid canned and prepackaged foods.  What  lifestyle changes can be made?  Exercise at least 30 minutes 5 or more days each week. Exercising includes brisk walking, yard work, biking, running, swimming, and team sports like basketball and soccer. Ask your health care provider which exercises are safe for you.  Do not use any products that contain nicotine or tobacco, such as cigarettes and e-cigarettes. If you need help quitting, ask your health care provider.  Limit alcohol intake to no  more than 1 drink a day for nonpregnant women and 2 drinks a day for men. One drink equals 12 oz of beer, 5 oz of wine, or 1 oz of hard liquor.  Try to get 7-9 hours of sleep each night. What other changes can be made?  Keep a food and activity journal to keep track of: ? What you ate and how many calories you had. Remember to count sauces, dressings, and side dishes. ? Whether you were active, and what exercises you did. ? Your calorie, weight, and activity goals.  Check your weight regularly. Track any changes. If you notice you have gained weight, make changes to your diet or activity routine.  Avoid taking weight-loss medicines or supplements. Talk to your health care provider before starting any new medicine or supplement.  Talk to your health care provider before trying any new diet or exercise plan. Why are these changes important? Eating healthy, staying active, and having healthy habits not only help prevent obesity, they also:  Help you to manage stress and emotions.  Help you to connect with friends and family.  Improve your self-esteem.  Improve your sleep.  Prevent long-term health problems.  What can happen if changes are not made? Being obese or overweight can cause you to develop joint or bone problems, which can make it hard for you to stay active or do activities you enjoy. Being obese or overweight also puts stress on your heart and lungs and can lead to health problems like diabetes, heart disease, and some cancers. Where to find more information: Talk with your health care provider or a dietitian about healthy eating and healthy lifestyle choices. You may also find other information through these resources:  U.S. Department of Agriculture MyPlate: https://ball-collins.biz/  American Heart Association: www.heart.org  Centers for Disease Control and Prevention: FootballExhibition.com.br  Summary  Staying at a healthy weight is important. It helps prevent certain diseases  and health problems, such as heart disease, diabetes, joint problems, sleep disorders, and some cancers.  Being obese or overweight can cause you to develop joint or bone problems, which can make it hard for you to stay active or do activities you enjoy.  You can prevent unhealthy weight gain by eating a healthy diet, exercising regularly, not smoking, limiting alcohol, and getting enough sleep.  Talk with your health care provider or a dietitian for guidance about healthy eating and healthy lifestyle choices. This information is not intended to replace advice given to you by your health care provider. Make sure you discuss any questions you have with your health care provider. Document Released: 05/01/2016 Document Revised: 06/06/2016 Document Reviewed: 06/06/2016 Elsevier Interactive Patient Education  [employer].

## 2017-12-04 NOTE — Progress Notes (Signed)
Order urine micro

## 2017-12-04 NOTE — Assessment & Plan Note (Signed)
Discussed; consider psych reasons for excess weight; she is working with her psychotherapist; offered referral to bariatric surgeon if she desires; see AVS

## 2017-12-05 LAB — SPECIMEN STATUS REPORT

## 2017-12-05 LAB — URINALYSIS, MICROSCOPIC ONLY
Casts: NONE SEEN /lpf
Epithelial Cells (non renal): >10 /hpf — AB (ref 0–10)
WBC, UA: >30 /hpf — AB (ref 0–5)

## 2017-12-05 LAB — HIV ANTIBODY (ROUTINE TESTING W REFLEX): HIV Screen 4th Generation wRfx: NONREACTIVE

## 2017-12-06 ENCOUNTER — Other Ambulatory Visit: Payer: Self-pay | Admitting: Family Medicine

## 2017-12-06 ENCOUNTER — Encounter: Payer: Self-pay | Admitting: Family Medicine

## 2017-12-06 MED ORDER — AMOXICILLIN-POT CLAVULANATE 875-125 MG PO TABS: 1.0000 | ORAL_TABLET | Freq: Two times a day (BID) | ORAL | 0 refills | Status: AC

## 2017-12-06 MED ORDER — CLINDAMYCIN PHOSPHATE 2 % VA CREA: 1.0000 | TOPICAL_CREAM | Freq: Every day | VAGINAL | 0 refills | Status: DC

## 2017-12-06 MED ORDER — AMOXICILLIN-POT CLAVULANATE 875-125 MG PO TABS: 1.0000 | ORAL_TABLET | Freq: Two times a day (BID) | ORAL | 0 refills | Status: DC

## 2017-12-06 NOTE — Progress Notes (Signed)
Stop macrobid; start augmentin Start clinda vag cream

## 2017-12-13 LAB — URINE CULTURE

## 2017-12-13 LAB — VAGINITIS/VAGINOSIS, DNA PROBE
Candida Species: NEGATIVE
Gardnerella vaginalis: POSITIVE — AB
Trichomonas vaginosis: NEGATIVE

## 2017-12-13 LAB — SPECIMEN STATUS REPORT

## 2017-12-13 LAB — WET PREP FOR TRICH, YEAST, CLUE

## 2017-12-13 LAB — GC/CHLAMYDIA PROBE AMP
Chlamydia trachomatis, NAA: NEGATIVE
Neisseria gonorrhoeae by PCR: NEGATIVE

## 2017-12-14 ENCOUNTER — Encounter: Payer: Self-pay | Admitting: Obstetrics and Gynecology

## 2017-12-18 ENCOUNTER — Ambulatory Visit: Payer: 59 | Admitting: Psychiatry

## 2017-12-18 ENCOUNTER — Encounter: Payer: Self-pay | Admitting: Psychiatry

## 2017-12-18 VITALS — BP 129/85 | HR 82 | Ht 63.0 in | Wt 299.0 lb

## 2017-12-18 DIAGNOSIS — F410 Panic disorder [episodic paroxysmal anxiety] without agoraphobia: Secondary | ICD-10-CM

## 2017-12-18 DIAGNOSIS — F411 Generalized anxiety disorder: Secondary | ICD-10-CM

## 2017-12-18 DIAGNOSIS — F5105 Insomnia due to other mental disorder: Secondary | ICD-10-CM | POA: Diagnosis not present

## 2017-12-18 DIAGNOSIS — F331 Major depressive disorder, recurrent, moderate: Secondary | ICD-10-CM

## 2017-12-18 MED ORDER — PROPRANOLOL HCL 10 MG PO TABS: 10.0000 mg | ORAL_TABLET | Freq: Three times a day (TID) | ORAL | 0 refills | Status: DC | PRN

## 2017-12-18 MED ORDER — SERTRALINE HCL 50 MG PO TABS: 50.0000 mg | ORAL_TABLET | Freq: Every day | ORAL | 0 refills | Status: DC

## 2017-12-18 MED ORDER — BREXPIPRAZOLE 0.5 MG PO TABS: 0.5000 mg | ORAL_TABLET | Freq: Every day | ORAL | 0 refills | Status: DC

## 2017-12-18 NOTE — Patient Instructions (Signed)
Propranolol tablets What is this medicine? PROPRANOLOL (proe PRAN oh lole) is a beta-blocker. Beta-blockers reduce the workload on the heart and help it to beat more regularly. This medicine is used to treat high blood pressure, to control irregular heart rhythms (arrhythmias) and to relieve chest pain caused by angina. It may also be helpful after a heart attack. This medicine is also used to prevent migraine headaches, relieve uncontrollable shaking (tremors), and help certain problems related to the thyroid gland and adrenal gland. This medicine may be used for other purposes; ask your health care provider or pharmacist if you have questions. COMMON BRAND NAME(S): Inderal What should I tell my health care provider before I take this medicine? They need to know if you have any of these conditions: -circulation problems or blood vessel disease -diabetes -history of heart attack or heart disease, vasospastic angina -kidney disease -liver disease -lung or breathing disease, like asthma or emphysema -pheochromocytoma -slow heart rate -thyroid disease -an unusual or allergic reaction to propranolol, other beta-blockers, medicines, foods, dyes, or preservatives -pregnant or trying to get pregnant -breast-feeding How should I use this medicine? Take this medicine by mouth with a glass of water. Follow the directions on the prescription label. Take your doses at regular intervals. Do not take your medicine more often than directed. Do not stop taking except on your the advice of your doctor or health care professional. Talk to your pediatrician regarding the use of this medicine in children. Special care may be needed. Overdosage: If you think you have taken too much of this medicine contact a poison control center or emergency room at once. NOTE: This medicine is only for you. Do not share this medicine with others. What if I miss a dose? If you miss a dose, take it as soon as you can. If it is  almost time for your next dose, take only that dose. Do not take double or extra doses. What may interact with this medicine? Do not take this medicine with any of the following medications: -feverfew -phenothiazines like chlorpromazine, mesoridazine, prochlorperazine, thioridazine This medicine may also interact with the following medications: -aluminum hydroxide gel -antipyrine -antiviral medicines for HIV or AIDS -barbiturates like phenobarbital -certain medicines for blood pressure, heart disease, irregular heart beat -cimetidine -ciprofloxacin -diazepam -fluconazole -haloperidol -isoniazid -medicines for cholesterol like cholestyramine or colestipol -medicines for mental depression -medicines for migraine headache like almotriptan, eletriptan, frovatriptan, naratriptan, rizatriptan, sumatriptan, zolmitriptan -NSAIDs, medicines for pain and inflammation, like ibuprofen or naproxen -phenytoin -rifampin -teniposide -theophylline -thyroid medicines -tolbutamide -warfarin -zileuton This list may not describe all possible interactions. Give your health care provider a list of all the medicines, herbs, non-prescription drugs, or dietary supplements you use. Also tell them if you smoke, drink alcohol, or use illegal drugs. Some items may interact with your medicine. What should I watch for while using this medicine? Visit your doctor or health care professional for regular check ups. Check your blood pressure and pulse rate regularly. Ask your health care professional what your blood pressure and pulse rate should be, and when you should contact them. You may get drowsy or dizzy. Do not drive, use machinery, or do anything that needs mental alertness until you know how this drug affects you. Do not stand or sit up quickly, especially if you are an older patient. This reduces the risk of dizzy or fainting spells. Alcohol can make you more drowsy and dizzy. Avoid alcoholic drinks. This  medicine can affect blood sugar levels. If   you have diabetes, check with your doctor or health care professional before you change your diet or the dose of your diabetic medicine. Do not treat yourself for coughs, colds, or pain while you are taking this medicine without asking your doctor or health care professional for advice. Some ingredients may increase your blood pressure. What side effects may I notice from receiving this medicine? Side effects that you should report to your doctor or health care professional as soon as possible: -allergic reactions like skin rash, itching or hives, swelling of the face, lips, or tongue -breathing problems -changes in blood sugar -cold hands or feet -difficulty sleeping, nightmares -dry peeling skin -hallucinations -muscle cramps or weakness -slow heart rate -swelling of the legs and ankles -vomiting Side effects that usually do not require medical attention (report to your doctor or health care professional if they continue or are bothersome): -change in sex drive or performance -diarrhea -dry sore eyes -hair loss -nausea -weak or tired This list may not describe all possible side effects. Call your doctor for medical advice about side effects. You may report side effects to FDA at 1-800-FDA-1088. Where should I keep my medicine? Keep out of the reach of children. Store at room temperature between 15 and 30 degrees C (59 and 86 degrees F). Protect from light. Throw away any unused medicine after the expiration date. NOTE: This sheet is a summary. It may not cover all possible information. If you have questions about this medicine, talk to your doctor, pharmacist, or health care provider.  2018 Elsevier/Gold Standard (2013-01-02 14:51:53)  

## 2017-12-18 NOTE — Progress Notes (Signed)
BH MD  OP Progress Note  12/18/2017 1:52 PM [name]  MRN:  [id number]  Chief Complaint: ' I am here for follow up." Chief Complaint    Follow-up     HPI: Katherine Huynh is a 22 year old African-American female, employed, lives in [place] with her parents, has a history of depression, anxiety, PCO S, presented to the clinic today for a follow-up visit.  Patient today reports that she started a new job recently.  She reports the first day she had a panic attack at work.  She reports the next day went okay.  She reports she continues to go to Ms. Felecia Janina Thompson for psychotherapy which is going well.  She reports she has been trying coping techniques, deep breathing and relaxation techniques.  She reports she tried the hydroxyzine when she had the panic attack but it makes her very drowsy.  She reports she cannot hence take it while she is at work.  She is interested in another medication which may help with her panic symptoms.  Patient reports her depressive symptoms is improving.  She continues to be compliant with her Zoloft.  She also reports sleep is improved on the melatonin.  Patient has a history of possible paranoia and was prescribed Rexulti last visit.  Patient reports her symptoms of paranoia is improved today.  She denies any side effects to the Rexulti..  Visit Diagnosis:    ICD-10-CM   1. MDD (major depressive disorder), recurrent episode, moderate (HCC) F33.1 Brexpiprazole (REXULTI) 0.5 MG TABS    sertraline (ZOLOFT) 50 MG tablet  2. GAD (generalized anxiety disorder) F41.1 Brexpiprazole (REXULTI) 0.5 MG TABS    sertraline (ZOLOFT) 50 MG tablet  3. Panic attacks F41.0 propranolol (INDERAL) 10 MG tablet  4. Insomnia due to mental condition F51.05     Past Psychiatric History: Patient reports a history of suicidal ideation as well as suicide attempt in the past.  I have reviewed past psychiatric history from my progress note on 11/06/2017.  Past Medical History:  Past Medical  History:  Diagnosis Date  . Depression   . Fam hx-ischem heart disease 09/15/2015  . Heart murmur   . Morbid obesity (HCC) 02/17/2016  . Polycystic disease, ovaries     Past Surgical History:  Procedure Laterality Date  . NO PAST SURGERIES      Family Psychiatric History: I have reviewed family psychiatric history from my progress note on 11/06/2017.  Family History:  Family History  Problem Relation Age of Onset  . Cancer Maternal Grandmother        breast  . Heart disease Maternal Grandfather   . Heart disease Paternal Grandfather   . Alcohol abuse Sister   . ADD / ADHD Sister     Social History: I have reviewed social history from my progress note on 11/06/2017.  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 Social History   Socioeconomic History  . Marital status: Single    Spouse name: Not on file  . Number of children: 0  . Years of education: Not on file  . Highest education level: High school graduate  Occupational History  . Not on file  Social Needs  . Financial resource strain: Not on file  . Food insecurity:    Worry: Never true    Inability: Never true  . Transportation needs:    Medical: No    Non-medical: No  Tobacco Use  . Smoking status: Former Smoker    Types: Cigarettes  Last attempt to quit: 09/28/2016    Years since quitting: 1.2  . Smokeless tobacco: Never Used  Substance and Sexual Activity  . Alcohol use: Yes    Alcohol/week: 3.6 oz    Types: 3 Glasses of wine, 2 Cans of beer, 1 Shots of liquor per week    Comment: occas  . Drug use: Not Currently    Types: Marijuana    Comment: last used about a month ago  . Sexual activity: Yes    Birth control/protection: None  Lifestyle  . Physical activity:    Days per week: 0 days    Minutes per session: 0 min  . Stress: Not on file  Relationships  . Social connections:    Talks on phone: Not on file    Gets together: Not on file    Attends religious service: Never    Active member of  club or organization: No    Attends meetings of clubs or organizations: Never    Relationship status: Never married  Other Topics Concern  . Not on file  Social History Narrative  . Not on file    Allergies:  Allergies  Allergen Reactions  . Cinnamon Itching  . Erythromycin Other (See Comments)    Unknown reaction, happened in childhood  . Sulfa Antibiotics Rash    Metabolic Disorder Labs: Lab Results  Component Value Date   HGBA1C 5.5 10/18/2017   Lab Results  Component Value Date   PROLACTIN 7.6 06/20/2016   Lab Results  Component Value Date   CHOL 164 10/18/2017   TRIG 121 10/18/2017   HDL 43 10/18/2017   CHOLHDL 3.8 10/18/2017   VLDL 19 02/17/2016   LDLCALC 97 10/18/2017   LDLCALC 102 (H) 06/20/2016   Lab Results  Component Value Date   TSH 1.210 10/18/2017   TSH 1.810 02/28/2017    Therapeutic Level Labs: No results found for: LITHIUM No results found for: VALPROATE No components found for:  CBMZ  Current Medications: Current Outpatient Medications  Medication Sig Dispense Refill  . Brexpiprazole (REXULTI) 0.5 MG TABS Take 1 tablet (0.5 mg total) by mouth at bedtime. 90 tablet 0  . hydrOXYzine (ATARAX/VISTARIL) 25 MG tablet Take 1 tablet (25 mg total) by mouth 3 (three) times daily as needed for anxiety (AND AT BEDTIME AS NEEDED FOR SLEEP). 90 tablet 1  . loratadine (CLARITIN) 10 MG tablet Take 1 tablet (10 mg total) by mouth daily as needed for allergies or itching. 30 tablet 11  . clindamycin (CLEOCIN) 2 % vaginal cream Place 1 Applicatorful vaginally at bedtime. (Patient not taking: Reported on 12/18/2017) 40 g 0  . propranolol (INDERAL) 10 MG tablet Take 1 tablet (10 mg total) by mouth 3 (three) times daily as needed. For severe anxiety sx 90 tablet 0  . sertraline (ZOLOFT) 50 MG tablet Take 1 tablet (50 mg total) by mouth daily. 90 tablet 0   No current facility-administered medications for this visit.      Musculoskeletal: Strength & Muscle  Tone: within normal limits Gait & Station: normal Patient leans: N/A  Psychiatric Specialty Exam: Review of Systems  Psychiatric/Behavioral: Positive for depression. The patient is nervous/anxious and has insomnia.   All other systems reviewed and are negative.   Blood pressure 129/85, pulse 82, height 5\' 3"  (1.6 m), weight 299 lb (135.6 kg), last menstrual period 11/24/2017, SpO2 98 %, unknown if currently breastfeeding.Body mass index is 52.97 kg/m.  General Appearance: Casual  Eye Contact:  Fair  Speech:  Clear and Coherent  Volume:  Normal  Mood:  Anxious and Dysphoric  Affect:  Congruent  Thought Process:  Goal Directed and Descriptions of Associations: Intact  Orientation:  Full (Time, Place, and Person)  Thought Content: Logical   Suicidal Thoughts:  No  Homicidal Thoughts:  No  Memory:  Immediate;   Fair Recent;   Fair Remote;   Fair  Judgement:  Fair  Insight:  Fair  Psychomotor Activity:  Normal  Concentration:  Concentration: Fair and Attention Span: Fair  Recall:  [employer] of Knowledge: Fair  Language: Fair  Akathisia:  No  Handed:  Right  AIMS (if indicated): 0  Assets:  Communication Skills Desire for Improvement Social Support  ADL's:  Intact  Cognition: WNL  Sleep:  improving   Screenings: AUDIT     Office Visit from 12/04/2017 in [facility]  Alcohol Use Disorder Identification Test Final Score (AUDIT)  4    PHQ2-9     Office Visit from 12/04/2017 in [facility] Office Visit from 07/26/2017 in [facility] Office Visit from 05[facility]17/2017 in [facility] Office Visit from 12/25/2016 in [facility] Office Visit from 10/12/2016 in [facility]  PHQ-2 Total Score  4  3  0  3  0  PHQ-9 Total Score  14  19  -  12  -       Assessment and Plan: Benisha is 22 yr old African-American female who is employed, engaged, lives in [place], has a history  of depression, anxiety, insomnia, PCO S, presented to the clinic today for a follow-up visit.  Patient today reports she continues to struggle with panic symptoms.  She reports her depressive symptoms is improving.  Patient will continue psychotherapy with Ms. Felecia Jan which is going well.  We will continue to make medication changes.  Plan MDD Increase Zoloft to 50 mg p.o. daily Continue Rexult 0.5 mg p.o. nightly for possible paranoia as well as to augment the Zoloft Continue psychotherapy with Ms. Felecia Jan.  For GAD/panic attacks Continue hydroxyzine as prescribed.  She however reports she cannot take it at work due to side effects of sedation. We will start propranolol 10 mg p.o. 3 times daily as needed for severe anxiety attacks Continue Zoloft as prescribed  For insomnia Continue sleep hygiene techniques Continue melatonin as needed.  Follow-up in clinic in 1 month.  More than 50 % of the time was spent for psychoeducation and supportive psychotherapy and care coordination. This note was generated in part or whole with voice recognition software. Voice recognition is usually quite accurate but there are transcription errors that can and very often do occur. I apologize for any typographical errors that were not detected and corrected.       Jomarie Longs, MD 12/18/2017, 1:52 PM

## 2018-01-15 DIAGNOSIS — F603 Borderline personality disorder: Secondary | ICD-10-CM | POA: Insufficient documentation

## 2018-01-16 ENCOUNTER — Encounter: Payer: Self-pay | Admitting: Psychiatry

## 2018-01-16 ENCOUNTER — Other Ambulatory Visit: Payer: Self-pay

## 2018-01-16 ENCOUNTER — Ambulatory Visit: Payer: 59 | Admitting: Psychiatry

## 2018-01-16 VITALS — BP 125/85 | HR 80 | Temp 99.0°F | Wt 304.0 lb

## 2018-01-16 DIAGNOSIS — F331 Major depressive disorder, recurrent, moderate: Secondary | ICD-10-CM

## 2018-01-16 DIAGNOSIS — F410 Panic disorder [episodic paroxysmal anxiety] without agoraphobia: Secondary | ICD-10-CM | POA: Diagnosis not present

## 2018-01-16 DIAGNOSIS — F411 Generalized anxiety disorder: Secondary | ICD-10-CM | POA: Diagnosis not present

## 2018-01-16 MED ORDER — SERTRALINE HCL 50 MG PO TABS: 75.0000 mg | ORAL_TABLET | Freq: Every day | ORAL | 0 refills | Status: DC

## 2018-01-16 MED ORDER — PROPRANOLOL HCL 10 MG PO TABS: 10.0000 mg | ORAL_TABLET | Freq: Three times a day (TID) | ORAL | 0 refills | Status: DC | PRN

## 2018-01-16 NOTE — Progress Notes (Signed)
BH MD OP Progress Note  01/16/2018 5:47 PM Infiniti Hoefling  MRN:  [id number]  Chief Complaint: ' I am here for follow up." Chief Complaint    Follow-up; Medication Refill     HPI: Katherine Huynh is a 22 old African-American female, employed, lives in [place], has a history of depression, anxiety, PCO S, presented to the clinic today for a follow-up visit.  Patient today reports she left her last job because she did not do well there and had severe anxiety sx .  She reports she has started a new job at the [employer] and it is going well.  She reports her schedule as okay, she has to work from 8 AM to 4 PM daily.  She reports she continues to have some anxiety symptoms now . She also reports some sadness on and off . She reports she is trying to work on mindfulness techniques with her therapist and that has been very helpful.  Continues to be compliant with her Zoloft.  Discussed readjusting her dosage today and she agrees with plan.  She denies any side effects to the Zoloft.  She reports she continues to sleep well.  She denies any suicidality.  She denies any perceptual disturbances.  She denies any paranoia at this time.  She is taking the Rexulti as prescribed.  She denies any side effects to the same.   Visit Diagnosis:    ICD-10-CM   1. MDD (major depressive disorder), recurrent episode, moderate (HCC) F33.1 sertraline (ZOLOFT) 50 MG tablet  2. GAD (generalized anxiety disorder) F41.1 sertraline (ZOLOFT) 50 MG tablet  3. Panic attacks F41.0 propranolol (INDERAL) 10 MG tablet    Past Psychiatric History: Have reviewed past psychiatric history from my progress note on 11/06/2017  Past Medical History:  Past Medical History:  Diagnosis Date  . Depression   . Fam hx-ischem heart disease 09/15/2015  . Heart murmur   . Morbid obesity (HCC) 02/17/2016  . Polycystic disease, ovaries     Past Surgical History:  Procedure Laterality Date  . NO PAST SURGERIES      Family Psychiatric History:  Have reviewed family psychiatric history from my progress note on 11/06/2017.  Family History:  Family History  Problem Relation Age of Onset  . Cancer Maternal Grandmother        breast  . Heart disease Maternal Grandfather   . Heart disease Paternal Grandfather   . Alcohol abuse Sister   . ADD / ADHD Sister     Social History: Patient's fianc returned to [place] recently and currently lives with her.  Patient has started a new job. Social History   Socioeconomic History  . Marital status: Single    Spouse name: Not on file  . Number of children: 0  . Years of education: Not on file  . Highest education level: High school graduate  Occupational History  . Not on file  Social Needs  . Financial resource strain: Not on file  . Food insecurity:    Worry: Never true    Inability: Never true  . Transportation needs:    Medical: No    Non-medical: No  Tobacco Use  . Smoking status: Former Smoker    Types: Cigarettes    Last attempt to quit: 09/28/2016    Years since quitting: 1.3  . Smokeless tobacco: Never Used  Substance and Sexual Activity  . Alcohol use: Yes    Alcohol/week: 6.0 standard drinks    Types: 3 Glasses of wine,  2 Cans of beer, 1 Shots of liquor per week    Comment: occas  . Drug use: Not Currently    Types: Marijuana    Comment: last used about a month ago  . Sexual activity: Yes    Birth control/protection: None  Lifestyle  . Physical activity:    Days per week: 0 days    Minutes per session: 0 min  . Stress: Not on file  Relationships  . Social connections:    Talks on phone: Not on file    Gets together: Not on file    Attends religious service: Never    Active member of club or organization: No    Attends meetings of clubs or organizations: Never    Relationship status: Never married  Other Topics Concern  . Not on file  Social History Narrative  . Not on file    Allergies:  Allergies  Allergen Reactions  . Cinnamon Itching   . Erythromycin Other (See Comments)    Unknown reaction, happened in childhood  . Sulfa Antibiotics Rash    Metabolic Disorder Labs: Lab Results  Component Value Date   HGBA1C 5.5 10/18/2017   Lab Results  Component Value Date   PROLACTIN 7.6 06/20/2016   Lab Results  Component Value Date   CHOL 164 10/18/2017   TRIG 121 10/18/2017   HDL 43 10/18/2017   CHOLHDL 3.8 10/18/2017   VLDL 19 02/17/2016   LDLCALC 97 10/18/2017   LDLCALC 102 (H) 06/20/2016   Lab Results  Component Value Date   TSH 1.210 10/18/2017   TSH 1.810 02/28/2017    Therapeutic Level Labs: No results found for: LITHIUM No results found for: VALPROATE No components found for:  CBMZ  Current Medications: Current Outpatient Medications  Medication Sig Dispense Refill  . Brexpiprazole (REXULTI) 0.5 MG TABS Take 1 tablet (0.5 mg total) by mouth at bedtime. 90 tablet 0  . clindamycin (CLEOCIN) 2 % vaginal cream Place 1 Applicatorful vaginally at bedtime. 40 g 0  . hydrOXYzine (ATARAX/VISTARIL) 25 MG tablet Take 1 tablet (25 mg total) by mouth 3 (three) times daily as needed for anxiety (AND AT BEDTIME AS NEEDED FOR SLEEP). 90 tablet 1  . loratadine (CLARITIN) 10 MG tablet Take 1 tablet (10 mg total) by mouth daily as needed for allergies or itching. 30 tablet 11  . propranolol (INDERAL) 10 MG tablet Take 1 tablet (10 mg total) by mouth 3 (three) times daily as needed. For severe anxiety sx 270 tablet 0  . sertraline (ZOLOFT) 50 MG tablet Take 1.5 tablets (75 mg total) by mouth daily. 135 tablet 0   No current facility-administered medications for this visit.      Musculoskeletal: Strength & Muscle Tone: within normal limits Gait & Station: normal Patient leans: N/A  Psychiatric Specialty Exam: Review of Systems  Psychiatric/Behavioral: Positive for depression. The patient is nervous/anxious.   All other systems reviewed and are negative.   Blood pressure 125/85, pulse 80, temperature 99 F (37.2  C), temperature source Oral, weight (!) 304 lb (137.9 kg), unknown if currently breastfeeding.Body mass index is 53.85 kg/m.  General Appearance: Casual  Eye Contact:  Fair  Speech:  Clear and Coherent  Volume:  Normal  Mood:  Anxious and Dysphoric  Affect:  Congruent  Thought Process:  Goal Directed and Descriptions of Associations: Intact  Orientation:  Full (Time, Place, and Person)  Thought Content: Logical   Suicidal Thoughts:  No  Homicidal Thoughts:  No  Memory:  Immediate;   Fair Recent;   Fair Remote;   Fair  Judgement:  Fair  Insight:  Fair  Psychomotor Activity:  Normal  Concentration:  Concentration: Fair and Attention Span: Fair  Recall:  [employer] of Knowledge: Fair  Language: Fair  Akathisia:  No  Handed:  Right  AIMS (if indicated): denies tremors, rigidity  Assets:  Communication Skills Desire for Improvement Social Support  ADL's:  Intact  Cognition: WNL  Sleep:  Fair   Screenings: AUDIT     Office Visit from 12/04/2017 in [facility]  Alcohol Use Disorder Identification Test Final Score (AUDIT)  4    PHQ2-9     Office Visit from 12/04/2017 in [facility] Office Visit from 07/26/2017 in [facility] Office Visit from 05/17/2017 in [facility] Office Visit from 12/25/2016 in [facility] Office Visit from 10/12/2016 in [facility]  PHQ-2 Total Score  4  3  0  3  0  PHQ-9 Total Score  14  19  -  12  -       Assessment and Plan:Jelina is a 22 year old African-American female who is employed, engaged, lives in [place], has a history of depression, anxiety, insomnia, PCO S, presented to the clinic today for a follow-up visit.  Patient continues to struggle with some anxiety and depressive symptoms.  She continues to work with her therapist and reports therapy is going well.  We will continue to make medication changes today.  She denies any  suicidality.  Plan as noted below.  Plan MDD Increase Zoloft to 75 mg p.o. daily Continue Rexulti 0.5 mg p.o. nightly. Continue psychotherapy with Ms. Felecia Jan.  For GAD/panic attacks Continue hydroxyzine as prescribed at night. She can continue propranolol 10 mg p.o. 3 times daily as needed for severe anxiety symptoms during the day.  She reports hydroxyzine makes her drowsy during the day. Increase Zoloft to 75 mg p.o. daily  For insomnia Continue melatonin as well as sleep hygiene techniques  Follow-up in clinic in 4 weeks or sooner if needed.  More than 50 % of the time was spent for psychoeducation and supportive psychotherapy and care coordination.  This note was generated in part or whole with voice recognition software. Voice recognition is usually quite accurate but there are transcription errors that can and very often do occur. I apologize for any typographical errors that were not detected and corrected.       Jomarie Longs, MD 01/16/2018, 5:47 PM

## 2018-01-21 ENCOUNTER — Other Ambulatory Visit: Payer: Self-pay | Admitting: Obstetrics and Gynecology

## 2018-02-13 ENCOUNTER — Ambulatory Visit: Payer: 59 | Admitting: Psychiatry

## 2018-02-27 ENCOUNTER — Ambulatory Visit: Payer: Self-pay | Admitting: Family Medicine

## 2018-02-27 ENCOUNTER — Encounter: Payer: Self-pay | Admitting: Family Medicine

## 2018-02-27 VITALS — BP 124/82 | HR 99 | Temp 97.9°F | Ht 63.0 in | Wt 307.5 lb

## 2018-02-27 DIAGNOSIS — R300 Dysuria: Secondary | ICD-10-CM

## 2018-02-27 DIAGNOSIS — B351 Tinea unguium: Secondary | ICD-10-CM

## 2018-02-27 DIAGNOSIS — N3001 Acute cystitis with hematuria: Secondary | ICD-10-CM

## 2018-02-27 DIAGNOSIS — Z23 Encounter for immunization: Secondary | ICD-10-CM

## 2018-02-27 LAB — POCT URINALYSIS DIPSTICK
Bilirubin, UA: NEGATIVE
Glucose, UA: NEGATIVE
Ketones, UA: NEGATIVE
Nitrite, UA: NEGATIVE
Odor: NORMAL
Protein, UA: NEGATIVE
Spec Grav, UA: 1.015 (ref 1.010–1.025)
Urobilinogen, UA: 0.2 E.U./dL
pH, UA: 5.5 (ref 5.0–8.0)

## 2018-02-27 MED ORDER — NITROFURANTOIN MONOHYD MACRO 100 MG PO CAPS: 100.0000 mg | ORAL_CAPSULE | Freq: Two times a day (BID) | ORAL | 0 refills | Status: DC

## 2018-02-27 MED ORDER — CICLOPIROX 8 % EX SOLN: Freq: Every day | CUTANEOUS | 5 refills | Status: DC

## 2018-02-27 NOTE — Progress Notes (Signed)
Urine

## 2018-02-27 NOTE — Assessment & Plan Note (Signed)
Will use topical Penlac; use separate clippers/trimmers for left and right feet; explained use of the Penlac, length of expected therapy

## 2018-02-27 NOTE — Patient Instructions (Signed)
Try to increase your water intake Cut down on sugary drinks Start the antibiotics Please do eat yogurt or kimchi or take a probiotic daily for the next month We want to replace the healthy germs in the gut If you notice foul, watery diarrhea in the next two months, schedule an appointment RIGHT AWAY or go to an urgent care or the emergency room if a holiday or over a weekend

## 2018-02-27 NOTE — Progress Notes (Signed)
BP 124/82   Pulse 99   Temp 97.9 F (36.6 C) (Oral)   Ht 5\' 3"  (1.6 m)   Wt (!) 307 lb 8 oz (139.5 kg)   LMP 02/20/2018   SpO2 99%   BMI 54.47 kg/m    Subjective:    Patient ID: Katherine Huynh, female    DOB: April 30, 1996, 22 y.o.   MRN: [id number]  HPI: Katherine Huynh is a 22 y.o. female  Chief Complaint  Patient presents with  . Urinary Tract Infection    onset 1 week symptoms include unable to empty bladder and painful urination  . Nail Problem    left foot, toe nail fungus    HPI She thinks she has a bladder infection; difficult to empty; urine has strong smell; no blood in the urine; no fevers; no dark color; hx of same and this feels like it No hx of kidney stones Nothing over the counter  Toenail fungus; she has had a toe fungus on one foot forever; the one it started on has improved, but the other four nails have gotten infected; she used topical on that; the other toenails are not responding; years; nails are thick  Depression screen [facility] 2/9 02/27/2018 12/04/2017 07/26/2017 05/17/2017 12/25/2016  Decreased Interest 3 1 0 0 0  Down, Depressed, Hopeless 3 3 3  0 3  PHQ - 2 Score 6 4 3  0 3  Altered sleeping 2 3 3  - 2  Tired, decreased energy 2 3 3  - 2  Change in appetite 3 1 3  - 3  Feeling bad or failure about yourself  2 1 3  - 1  Trouble concentrating 1 2 3  - 1  Moving slowly or fidgety/restless 0 0 0 - 0  Suicidal thoughts 0 0 1 - 0  PHQ-9 Score 16 14 19  - 12  Difficult doing work/chores Extremely dIfficult Not difficult at all Extremely dIfficult - Somewhat difficult   Fall Risk  02/27/2018 12/04/2017 07/26/2017 05/17/2017 12/25/2016  Falls in the past year? No No No No No    Relevant past medical, surgical, family and social history reviewed Past Medical History:  Diagnosis Date  . Depression   . Fam hx-ischem heart disease 09/15/2015  . Heart murmur   . Morbid obesity (HCC) 02/17/2016  . Polycystic disease, ovaries    Past Surgical History:  Procedure Laterality  Date  . NO PAST SURGERIES     Family History  Problem Relation Age of Onset  . Cancer Maternal Grandmother        breast  . Heart disease Maternal Grandfather   . Heart disease Paternal Grandfather   . Alcohol abuse Sister   . ADD / ADHD Sister    Social History   Tobacco Use  . Smoking status: Never Smoker  . Smokeless tobacco: Never Used  Substance Use Topics  . Alcohol use: Yes    Alcohol/week: 6.0 standard drinks    Types: 3 Glasses of wine, 2 Cans of beer, 1 Shots of liquor per week    Comment: occas  . Drug use: Not Currently    Types: Marijuana    Comment: last used about a month ago     Office Visit from 02/27/2018 in [facility]  AUDIT-C Score  5      Interim medical history since last visit reviewed. Allergies and medications reviewed  Review of Systems Per HPI unless specifically indicated above     Objective:    BP 124/82   Pulse  99   Temp 97.9 F (36.6 C) (Oral)   Ht 5\' 3"  (1.6 m)   Wt (!) 307 lb 8 oz (139.5 kg)   LMP 02/20/2018   SpO2 99%   BMI 54.47 kg/m   Wt Readings from Last 3 Encounters:  02/27/18 (!) 307 lb 8 oz (139.5 kg)  12/04/17 297 lb 6.4 oz (134.9 kg)  10/17/17 288 lb 11.2 oz (131 kg)    Physical Exam  Constitutional: She appears well-developed and well-nourished.  Morbidly obese  HENT:  Mouth/Throat: Mucous membranes are normal.  Eyes: EOM are normal. No scleral icterus.  Cardiovascular: Normal rate and regular rhythm.  Pulmonary/Chest: Effort normal and breath sounds normal.  Abdominal: She exhibits no distension. There is no tenderness. There is no guarding and no CVA tenderness.  Skin:  Thickened toenails on the LEFT foot, great toe as well as digits 3, 4, and 5  Psychiatric: She has a normal mood and affect. Her mood appears not anxious. She is not agitated, not aggressive, not hyperactive, not slowed and not withdrawn. She does not exhibit a depressed mood. She expresses no homicidal and no suicidal  ideation.  Wearing a bright greyish-pink wig; seems very light-hearted, does not appear depressed, anxious; AXIS II deferred    Results for orders placed or performed in visit on 02/27/18  POCT urinalysis dipstick  Result Value Ref Range   Color, UA yellow    Clarity, UA clear    Glucose, UA Negative Negative   Bilirubin, UA neg    Ketones, UA neg    Spec Grav, UA 1.015 1.010 - 1.025   Blood, UA moderate    pH, UA 5.5 5.0 - 8.0   Protein, UA Negative Negative   Urobilinogen, UA 0.2 0.2 or 1.0 E.U./dL   Nitrite, UA neg    Leukocytes, UA Large (3+) (A) Negative   Appearance clear    Odor normal       Assessment & Plan:   Problem List Items Addressed This Visit      Musculoskeletal and Integument   Onychomycosis    Will use topical Penlac; use separate clippers/trimmers for left and right feet; explained use of the Penlac, length of expected therapy      Relevant Medications   ciclopirox (PENLAC) 8 % solution    Other Visit Diagnoses    Burning with urination    -  Primary   encourged hydration, limit or avoid sugary drinks; treat probable UTI   Relevant Orders   POCT urinalysis dipstick (Completed)   Urine Culture   Acute cystitis with hematuria       start antibiotics; return in 2-3 weeks for recheck urine (dip plus microscopic)   Need for influenza vaccination       Relevant Orders   Flu Vaccine QUAD 6+ mos PF IM (Fluarix Quad PF) (Completed)       Follow up plan: Return for 1 week cma visit recheck blood in urine.  An after-visit summary was printed and given to the patient at check-out.  Please see the patient instructions which may contain other information and recommendations beyond what is mentioned above in the assessment and plan.  Meds ordered this encounter  Medications  . nitrofurantoin, macrocrystal-monohydrate, (MACROBID) 100 MG capsule    Sig: Take 1 capsule (100 mg total) by mouth 2 (two) times daily.    Dispense:  6 capsule    Refill:  0  .  ciclopirox (PENLAC) 8 % solution    Sig:  Apply topically at bedtime. Apply over nail and surrounding skin. Apply daily over previous coat. After seven (7) days, may remove with alcohol and continue cycle.    Dispense:  6.6 mL    Refill:  5    Orders Placed This Encounter  Procedures  . Urine Culture  . Flu Vaccine QUAD 6+ mos PF IM (Fluarix Quad PF)  . POCT urinalysis dipstick

## 2018-03-01 LAB — URINE CULTURE
MICRO NUMBER:: 91251182
SPECIMEN QUALITY:: ADEQUATE

## 2018-03-06 ENCOUNTER — Ambulatory Visit: Payer: Self-pay

## 2018-03-27 ENCOUNTER — Encounter: Payer: Self-pay | Admitting: Family Medicine

## 2018-03-28 NOTE — Telephone Encounter (Signed)
Refer to bar surg

## 2018-04-28 ENCOUNTER — Ambulatory Visit: Payer: Self-pay | Admitting: Family Medicine

## 2018-05-13 ENCOUNTER — Encounter: Payer: Self-pay | Admitting: Obstetrics and Gynecology

## 2018-05-13 ENCOUNTER — Ambulatory Visit (INDEPENDENT_AMBULATORY_CARE_PROVIDER_SITE_OTHER): Payer: Managed Care, Other (non HMO) | Admitting: Obstetrics and Gynecology

## 2018-05-13 VITALS — BP 137/87 | HR 81 | Ht 63.0 in | Wt 319.8 lb

## 2018-05-13 DIAGNOSIS — E282 Polycystic ovarian syndrome: Secondary | ICD-10-CM

## 2018-05-13 DIAGNOSIS — N926 Irregular menstruation, unspecified: Secondary | ICD-10-CM | POA: Diagnosis not present

## 2018-05-13 MED ORDER — NORGESTIMATE-ETH ESTRADIOL 0.25-35 MG-MCG PO TABS: 1.0000 | ORAL_TABLET | Freq: Every day | ORAL | 11 refills | Status: DC

## 2018-05-13 NOTE — Progress Notes (Signed)
  Subjective:     Patient ID: Katherine Huynh, female   DOB: 10/16/1995, 22 y.o.   MRN: [id number]  HPI Spotting for a day or two monthly until December 13th, at which time she had 2 days of spotting that turned into heavy bleeding. Cramping 2 days of the time and took aspirin which helped. changing pads every hour and at times bleeding through them.   In November had 2 UPT+ at home, and then started normal menstrual flow the next day, only bleed one day.  Is sexually active with female partner.  Review of Systems  Genitourinary: Positive for menstrual problem and vaginal bleeding.  All other systems reviewed and are negative.      Objective:   Physical Exam A&Ox4 Well groomed female in no distress Blood pressure 137/87, pulse 81, height 5\' 3"  (1.6 m), weight (!) 319 lb 12.8 oz (145.1 kg), last menstrual period 04/25/2018, unknown if currently breastfeeding.  Body mass index is 56.65 kg/m.  Pelvic exam: normal external genitalia, vulva, vagina, cervix, uterus and adnexa, CERVIX: normal appearing cervix without  lesions, cervical discharge present - bloody.    Assessment:     Irregular menses PCOS Morbid obesity    Plan:     Labs obtained- will follow up accordingly Pelvic u/s ordered Provera challenge declined- will start low dose OCPs to regulate menses and return in next few weeks for pelvic ultrasound

## 2018-05-14 LAB — CBC WITH DIFFERENTIAL/PLATELET
Basophils Absolute: 0 10*3/uL (ref 0.0–0.2)
Basos: 0 %
EOS (ABSOLUTE): 0.1 10*3/uL (ref 0.0–0.4)
Eos: 2 %
Hematocrit: 36.4 % (ref 34.0–46.6)
Hemoglobin: 11.9 g/dL (ref 11.1–15.9)
Immature Grans (Abs): 0 10*3/uL (ref 0.0–0.1)
Immature Granulocytes: 0 %
Lymphocytes Absolute: 2.3 10*3/uL (ref 0.7–3.1)
Lymphs: 34 %
MCH: 26.8 pg (ref 26.6–33.0)
MCHC: 32.7 g/dL (ref 31.5–35.7)
MCV: 82 fL (ref 79–97)
Monocytes Absolute: 0.4 10*3/uL (ref 0.1–0.9)
Monocytes: 6 %
Neutrophils Absolute: 3.9 10*3/uL (ref 1.4–7.0)
Neutrophils: 58 %
Platelets: 319 10*3/uL (ref 150–450)
RBC: 4.44 x10E6/uL (ref 3.77–5.28)
RDW: 14.2 % (ref 12.3–15.4)
WBC: 6.8 10*3/uL (ref 3.4–10.8)

## 2018-05-14 LAB — THYROID PANEL WITH TSH
Free Thyroxine Index: 1.5 (ref 1.2–4.9)
T3 Uptake Ratio: 24 % (ref 24–39)
T4, Total: 6.4 ug/dL (ref 4.5–12.0)
TSH: 1.89 u[IU]/mL (ref 0.450–4.500)

## 2018-05-14 LAB — FSH/LH
FSH: 4.7 m[IU]/mL
LH: 10.4 m[IU]/mL

## 2018-05-14 LAB — BETA HCG QUANT (REF LAB): hCG Quant: <1 m[IU]/mL

## 2018-05-14 LAB — IRON: Iron: 62 ug/dL (ref 27–159)

## 2018-05-21 ENCOUNTER — Telehealth: Payer: Self-pay | Admitting: Obstetrics and Gynecology

## 2018-05-21 NOTE — Telephone Encounter (Signed)
Patient called with concerns regarding symptoms that started along with taking new birth control. She would like a call back ASAP.Thanks

## 2018-05-22 ENCOUNTER — Encounter: Payer: Managed Care, Other (non HMO) | Admitting: Obstetrics and Gynecology

## 2018-05-22 ENCOUNTER — Ambulatory Visit (INDEPENDENT_AMBULATORY_CARE_PROVIDER_SITE_OTHER): Payer: Managed Care, Other (non HMO)

## 2018-05-22 ENCOUNTER — Ambulatory Visit (INDEPENDENT_AMBULATORY_CARE_PROVIDER_SITE_OTHER): Payer: Managed Care, Other (non HMO) | Admitting: Obstetrics and Gynecology

## 2018-05-22 ENCOUNTER — Encounter: Payer: Self-pay | Admitting: Obstetrics and Gynecology

## 2018-05-22 ENCOUNTER — Ambulatory Visit: Payer: Managed Care, Other (non HMO) | Admitting: Obstetrics and Gynecology

## 2018-05-22 VITALS — BP 114/81 | HR 90 | Ht 63.0 in | Wt 320.2 lb

## 2018-05-22 DIAGNOSIS — E282 Polycystic ovarian syndrome: Secondary | ICD-10-CM | POA: Diagnosis not present

## 2018-05-22 DIAGNOSIS — N926 Irregular menstruation, unspecified: Secondary | ICD-10-CM

## 2018-05-22 DIAGNOSIS — N83201 Unspecified ovarian cyst, right side: Secondary | ICD-10-CM

## 2018-05-22 NOTE — Patient Instructions (Signed)
Diet for Polycystic Ovary Syndrome  Polycystic ovary syndrome (PCOS) is a disorder of the chemicals (hormones) that regulate a woman's reproductive system, including monthly periods (menstruation). The condition causes important hormones to be out of balance. PCOS can:  · Stop your periods or make them irregular.  · Cause cysts to develop on your ovaries.  · Make it difficult to get pregnant.  · Stop your body from responding to the effects of insulin (insulin resistance). Insulin resistance can lead to obesity and diabetes.  Changing what you eat can help you manage PCOS and improve your health. Following a balanced diet can help you lose weight and improve the way that your body uses insulin.  What are tips for following this plan?  · Follow a balanced diet for meals and snacks. Eat breakfast, lunch, dinner, and one or two snacks every day.  · Include protein in each meal and snack.  · Choose whole grains instead of products that are made with refined flour.  · Eat a variety of foods.  · Exercise regularly as told by your health care provider. Aim to do 30 or more minutes of exercise on most days of the week.  · If you are overweight or obese:  ? Pay attention to how many calories you eat. Cutting down on calories can help you lose weight.  ? Work with your health care provider or a diet and nutrition specialist (dietitian) to figure out how many calories you need each day.  What foods can I eat?    Fruits  Include a variety of colors and types. All fruits are helpful for PCOS.  Vegetables  Include a variety of colors and types. All vegetables are helpful for PCOS.  Grains  Whole grains, such as whole wheat. Whole-grain breads, crackers, cereals, and pasta. Unsweetened oatmeal, bulgur, barley, quinoa, and Disch rice. Tortillas made from corn or whole-wheat flour.  Meats and other proteins  Low-fat (lean) proteins, such as fish, chicken, beans, eggs, and tofu.  Dairy  Low-fat dairy products, such as skim milk,  cheese sticks, and yogurt.  Beverages  Low-fat or fat-free drinks, such as water, low-fat milk, sugar-free drinks, and small amounts of 100% fruit juice.  Seasonings and condiments  Ketchup. Mustard. Barbecue sauce. Relish. Low-fat or fat-free mayonnaise.  Fats and oils  Olive oil or canola oil. Walnuts and almonds.  The items listed above may not be a complete list of recommended foods and beverages. Contact a dietitian for more options.  What foods are not recommended?  Foods that are high in calories or fat. Fried foods. Sweets. Products that are made from refined white flour, including white bread, pastries, white rice, and pasta.  The items listed above may not be a complete list of foods and beverages to avoid. Contact a dietitian for more information.  Summary  · PCOS is a hormonal imbalance that affects a woman's reproductive system.  · You can help to manage your PCOS by exercising regularly and eating a healthy, varied diet of vegetables, fruit, whole grains, low-fat (lean) protein, and low-fat dairy products.  · Changing what you eat can improve the way that your body uses insulin, help your hormones reach normal levels, and help you lose weight.  This information is not intended to replace advice given to you by your health care provider. Make sure you discuss any questions you have with your health care provider.  Document Released: 08/22/2015 Document Revised: 03/04/2017 Document Reviewed: 03/04/2017  Elsevier Interactive Patient   Education © 2019 [employer]

## 2018-05-22 NOTE — Progress Notes (Signed)
HERE TO DISCUSS PELVIC ULTRASOUND DONE TODAY:  Indications:PCOS Findings:  The uterus is anteverted and measures 8.5x3.8x4.8cm. Echo texture is homogenous without evidence of focal masses. The Endometrium measures 13.2 mm.  Right Ovary measures 7.4 cm3. It has some evidences of PCOS. A simple cyst = 2x2 cm is seen medial to the Rt ovary (possibility of para-ovarian cyst vs others) Left Ovary measures 5.6 cm3. It It has some evidences of PCOS.  There is no free fluid in the cul de sac.  Impression: 1.Right Ovary measures 7.4 cm3. It has some evidences of PCOS.  2. A simple cyst = 2x2 cm is seen medial to the Rt ovary (possibility of para-ovarian cyst vs others) 3. Left Ovary measures 5.6 cm3. It It has some evidences of PCOS.   Discussed ultrasound findings, and reviewed PCOS. Patient refuses metformin and OCPs as they make her 'sick'. Also refuses to see endocrinologist again as she didn't feel like she had anything to offer.   Discussed PCOS diet and evaluation by Amy Brain and balanced therapies.   RTC as needed.   Melody Shambley,CNM

## 2018-05-22 NOTE — Telephone Encounter (Signed)
See MyChart note, [facility] responded to patient.

## 2018-05-30 ENCOUNTER — Telehealth: Payer: Self-pay | Admitting: Obstetrics and Gynecology

## 2018-05-30 ENCOUNTER — Ambulatory Visit: Payer: Managed Care, Other (non HMO) | Admitting: Family Medicine

## 2018-05-30 ENCOUNTER — Encounter: Payer: Self-pay | Admitting: Family Medicine

## 2018-05-30 ENCOUNTER — Other Ambulatory Visit: Payer: Self-pay | Admitting: Obstetrics and Gynecology

## 2018-05-30 ENCOUNTER — Ambulatory Visit: Payer: Self-pay | Admitting: *Deleted

## 2018-05-30 VITALS — BP 136/88 | HR 99 | Temp 98.5°F | Resp 18 | Ht 63.0 in | Wt 325.1 lb

## 2018-05-30 DIAGNOSIS — F331 Major depressive disorder, recurrent, moderate: Secondary | ICD-10-CM

## 2018-05-30 DIAGNOSIS — N921 Excessive and frequent menstruation with irregular cycle: Secondary | ICD-10-CM | POA: Diagnosis not present

## 2018-05-30 DIAGNOSIS — N946 Dysmenorrhea, unspecified: Secondary | ICD-10-CM | POA: Diagnosis not present

## 2018-05-30 DIAGNOSIS — E282 Polycystic ovarian syndrome: Secondary | ICD-10-CM

## 2018-05-30 MED ORDER — IBUPROFEN 800 MG PO TABS: 800.0000 mg | ORAL_TABLET | Freq: Three times a day (TID) | ORAL | 0 refills | Status: DC | PRN

## 2018-05-30 MED ORDER — MEDROXYPROGESTERONE ACETATE 10 MG PO TABS: 10.0000 mg | ORAL_TABLET | Freq: Every day | ORAL | 2 refills | Status: DC

## 2018-05-30 NOTE — Progress Notes (Signed)
Name: Katherine Huynh   MRN: [id number]    DOB: July 02, 1995   Date:05/30/2018       Progress Note  Subjective  Chief Complaint  Chief Complaint  Patient presents with  . Abdominal Pain    Onset-2 days, lower abdomen, throbbing constant pain  . Fatigue    HPI  Dysmenorrhea/PCOS and irregular cycles: she has been bleeding for the past 2 months, she has seen Melody at [facility] had multiple labs and pelvic US. She was given ocp, but stopped taking 2 weeks ago because it was causing nausea, and did not get Provera. She states two days ago cycles become heavier and more painful. She states took ibuprofen without much help, but no pain while sleeping, changing tampons every hour and is feeling tired. Not sexually active for the past 2 months because of bleeding. She would like to get pregnant. Discussed re-considering taking Provera and also Metformin since it will help regulate her cycles  Major Depression: she sees Dr. Elna Breslow and psychologist but lost to follow up and has been out of medication, she states she never filled rx given in September. She states she was too busy and could not go to pharmacy. Advised her to go pick it up prior to visit with Dr. Elna Breslow.    Patient Active Problem List   Diagnosis Date Noted  . Murmur, cardiac 10/12/2016  . Polycystic disease, ovaries 10/02/2016  . 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 Morbid obesity (HCC) 02/17/2016  . Difficulty controlling anger 02/17/2016  . Binge eating disorder 02/17/2016  . Athlete's foot, left 10/10/2015  . Personal history of sulfonamide allergy 09/15/2015  . Onychomycosis 09/15/2015  . Fam hx-ischem heart disease 09/15/2015    Past Surgical History:  Procedure Laterality Date  . NO PAST SURGERIES      Family History  Problem Relation Age of Onset  . Cancer Maternal Grandmother        breast  . Heart disease Maternal Grandfather   . Heart disease Paternal Grandfather   . Alcohol abuse Sister   . ADD /  ADHD Sister     Social History   Socioeconomic History  . Marital status: Significant Other    Spouse name: [name]   . Number of children: 0  . Years of education: Not on file  . Highest education level: High school graduate  Occupational History  . Occupation: Music therapist  . Financial resource strain: Not hard at all  . Food insecurity:    Worry: Never true    Inability: Never true  . Transportation needs:    Medical: No    Non-medical: No  Tobacco Use  . Smoking status: Never Smoker  . Smokeless tobacco: Never Used  Substance and Sexual Activity  . Alcohol use: Yes    Alcohol/week: 6.0 standard drinks    Types: 3 Glasses of wine, 2 Cans of beer, 1 Shots of liquor per week    Comment: occas  . Drug use: Not Currently    Types: Marijuana    Comment: last used about a month ago  . Sexual activity: Yes    Birth control/protection: None  Lifestyle  . Physical activity:    Days per week: 0 days    Minutes per session: 0 min  . Stress: Very much  Relationships  . Social connections:    Talks on phone: Not on file    Gets together: Not on file    Attends religious service: Never  Active member of club or organization: No    Attends meetings of clubs or organizations: Never    Relationship status: Never married  . Intimate partner violence:    Fear of current or ex partner: No    Emotionally abused: No    Physically abused: No    Forced sexual activity: No  Other Topics Concern  . Not on file  Social History Narrative   Lives with fiancee, they live together, works full time.      Current Outpatient Medications:  .  medroxyPROGESTERone (PROVERA) 10 MG tablet, Take 1 tablet (10 mg total) by mouth daily. Use for ten days (Patient not taking: Reported on 05/30/2018), Disp: 10 tablet, Rfl: 2 .  propranolol (INDERAL) 10 MG tablet, Take 1 tablet (10 mg total) by mouth 3 (three) times daily as needed. For severe anxiety sx (Patient not taking: Reported on  05/13/2018), Disp: 270 tablet, Rfl: 0 .  sertraline (ZOLOFT) 50 MG tablet, Take 1.5 tablets (75 mg total) by mouth daily. (Patient not taking: Reported on 05/13/2018), Disp: 135 tablet, Rfl: 0  Allergies  Allergen Reactions  . Cinnamon Itching  . Erythromycin Other (See Comments)    Unknown reaction, happened in childhood  . Sulfa Antibiotics Rash    I personally reviewed active problem list, medication list, allergies, family history, social history with the patient/caregiver today.   ROS  Constitutional: Negative for fever, positive for  weight change.  Respiratory: Negative for cough and shortness of breath.   Cardiovascular: Negative for chest pain or palpitations.  Gastrointestinal: Positive for abdominal pain - cramping, no  bowel changes.  Musculoskeletal: Negative for gait problem or joint swelling.  Skin: Negative for rash.  Neurological: Negative for dizziness or headache.  No other specific complaints in a complete review of systems (except as listed in HPI above).  Objective  Vitals:   05/30/18 1404  BP: 136/88  Pulse: 99  Resp: 18  Temp: 98.5 F (36.9 C)  TempSrc: Oral  SpO2: 96%  Weight: (!) 325 lb 1.6 oz (147.5 kg)  Height: 5\' 3"  (1.6 m)    Body mass index is 57.59 kg/m.  Physical Exam  Constitutional: Patient appears well-developed and well-nourished. Obese  No distress.  HEENT: head atraumatic, normocephalic, pupils equal and reactive to light,  neck supple, throat within normal limits Cardiovascular: Normal rate, regular rhythm and normal heart sounds.  No murmur heard. No BLE edema. Pulmonary/Chest: Effort normal and breath sounds normal. No respiratory distress. Abdominal: Soft.  There is no tenderness, negative CVA. Psychiatric: Patient has a normal mood and affect. behavior is normal. Judgment and thought content normal.  Recent Results (from the past 2160 hour(s))  Beta hCG quant (ref lab)     Status: None   Collection Time: 05/13/18  3:45  PM  Result Value Ref Range   hCG Quant <1 mIU/mL    Comment:                      Female (Non-pregnant)    0 -     5                             (Postmenopausal)  0 -     8                      Female (Pregnant)  Weeks of Gestation                              3                6 -    71                              4               10 -   750                              5              217 -  7138                              6              158 - [place]                              7             3697 -[place]  18             8099 - 58[address] Roche E CLIA methodology   Thyroid Panel With TSH     Status: None   Collection Time: 05/13/18  3:45 PM  Result Value Ref Range   TSH 1.890 0.450 - 4.500 uIU/mL   T4, Total 6.4 4.5 - 12.0 ug/dL   T3 Uptake Ratio 24 24 - 39 %   Free Thyroxine Index 1.5 1.2 - 4.9  CBC w/Diff     Status: None   Collection Time: 05/13/18  3:45 PM  Result Value Ref Range   WBC 6.8 3.4 - 10.8 x10E3/uL   RBC 4.44 3.77 - 5.28 x10E6/uL   Hemoglobin 11.9 11.1 - 15.9 g/dL   Hematocrit 16.1 09.6 - 46.6 %   MCV 82 79 - 97 fL   MCH 26.8 26.6 - 33.0 pg   MCHC 32.7 31.5 - 35.7 g/dL   RDW 04.5 40.9 - 81.1 %    Comment: **Effective May 19, 2018, the RDW pediatric reference**   interval will be removed and the adult reference interval   will be changing to:                             Female 11.7 - 15.4                                                      Female 11.6 - 15.4    Platelets  319 150 - 450 x10E3/uL   Neutrophils 58 Not Estab. %   Lymphs 34 Not Estab. %   Monocytes 6 Not Estab. %   Eos 2 Not Estab. %   Basos 0 Not Estab. %   Neutrophils Absolute 3.9 1.4 - 7.0 x10E3/uL   Lymphocytes Absolute 2.3 0.7 - 3.1 x10E3/uL   Monocytes Absolute 0.4 0.1 - 0.9 x10E3/uL   EOS (ABSOLUTE) 0.1 0.0 - 0.4 x10E3/uL   Basophils Absolute 0.0 0.0 - 0.2 x10E3/uL   Immature Granulocytes 0 Not Estab. %   Immature Grans (Abs) 0.0 0.0 - 0.1 x10E3/uL  Iron     Status: None   Collection Time: 05/13/18  3:45 PM  Result Value Ref Range   Iron 62 27 - 159 ug/dL  FSH/LH     Status: None   Collection Time: 05/13/18  3:45 PM  Result Value Ref Range   LH 10.4 mIU/mL    Comment:                     Adult Female:                       Follicular phase      2.4 -  12.6                       Ovulation phase      14.0 -  95.6                       Luteal phase          1.0 -  11.4                       Postmenopausal  7.7 -  58.5    FSH 4.7 mIU/mL    Comment:                     Adult Female:                       Follicular phase      3.5 -  12.5                       Ovulation phase       4.7 -  21.5                       Luteal phase          1.7 -   7.7                       Postmenopausal       25.8 - 134.8     PHQ2/9: Depression screen [facility] 2/9 05/30/2018 02/27/2018 12/04/2017 07/26/2017 05/17/2017  Decreased Interest 2 3 1  0 0  Down, Depressed, Hopeless 2 3 3 3  0  PHQ - 2 Score 4 6 4 3  0  Altered sleeping 3 2 3 3  -  Tired, decreased energy 3 2 3 3  -  Change in appetite 3 3 1 3  -  Feeling bad or failure about yourself  2 2 1 3  -  Trouble concentrating 3 1 2 3  -  Moving slowly or fidgety/restless 1 0 0 0 -  Suicidal thoughts 1 0 0 1 -  PHQ-9 Score 20 16 14 19  -  Difficult doing work/chores Extremely dIfficult Extremely dIfficult Not difficult at all Extremely dIfficult -  Some recent data might be hidden     Fall Risk: Fall Risk  05/30/2018 02/27/2018 12/04/2017 07/26/2017  05/17/2017  Falls in the past year? 0 No No No No      Functional Status Survey: Is the patient deaf or have difficulty hearing?: No Does the patient have difficulty seeing, even when wearing glasses/contacts?: No Does the patient have difficulty concentrating, remembering, or making decisions?: No Does the patient have difficulty walking or climbing stairs?: No Does the patient have difficulty dressing or bathing?: No Does the patient have difficulty doing errands alone such as visiting a doctor's office or shopping?: No    Assessment & Plan  1. PCOS (polycystic ovarian syndrome)  We will contact [facility] and get her an appointment in the next couple of weeks   2. Menorrhagia with irregular cycle  - ibuprofen (ADVIL,MOTRIN) 800 MG tablet; Take 1 tablet (800 mg total) by mouth every 8 (eight) hours as needed.  Dispense: 90 tablet; Refill: 0 She asked for something stronger, but explained pain medication will not decrease flow , but provera should control the bleeding and a few doses of ibuprofen should get pain under control  Warm compresses and go to [facility] if bleeding gets worse Also advised to get provera that was sent to pharmacy by  Gyn   3. Moderate episode of recurrent major depressive disorder (HCC)  She missed appointment with Dr. Elna Breslow but will re-schedule she will also follow up with psychologist, she is very depressed but not suicidal . She has the suicide hotline number  She will fill rx of Zoloft that is at the pharmacy today   4. Severe dysmenorrhea  - ibuprofen (ADVIL,MOTRIN) 800 MG tablet; Take 1 tablet (800 mg total)  by mouth every 8 (eight) hours as needed.  Dispense: 90 tablet; Refill: 0

## 2018-05-30 NOTE — Telephone Encounter (Signed)
Patient called stating she has been bleeding extremely heavy for 2 months. She states she is not feeling well and is experiencing "crippling" cramps today. She would like a call back. Thanks

## 2018-05-30 NOTE — Telephone Encounter (Signed)
Pt with complaints of heavy menstrual bleeding for the past 2 months. Pt states she is changing her pad and super plus tampon once every hour. Pt states she has a history of PCOS and is not currently on birth control.Pt states she has been feeling "weird and really tired". Pt denies feeling lightheaded and denies other symptoms. Pt states she has reached out to her GYN multiple times but has not received a response. No appt availability with PCP today. Pt scheduled for appt with Dr. Carlynn Purl at 1:40 pm. Pt advised to go to ED with worsening symptoms before appt. Pt verbalized understanding.   Reason for Disposition . MODERATE vaginal bleeding (i.e., soaking 1 pad or tampon per hour and present > 6 hours; 1 menstrual cup every 6 hours)  Answer Assessment - Initial Assessment Questions 1. AMOUNT: "Describe the bleeding that you are having."    - SPOTTING: spotting, or pinkish / brownish mucous discharge; does not fill panti-liner or pad    - MILD:  less than 1 pad / hour; less than patient's usual menstrual bleeding   - MODERATE: 1-2 pads / hour; 1 menstrual cup every 6 hours; small-medium blood clots (e.g., pea, grape, small coin)   - SEVERE: soaking 2 or more pads/hour for 2 or more hours; 1 menstrual cup every 2 hours; bleeding not contained by pads or continuous red blood from vagina; large blood clots (e.g., golf ball, large coin)      moderate 2. ONSET: "When did the bleeding begin?" "Is it continuing now?"     2 months ago 3. MENSTRUAL PERIOD: "When was the last normal menstrual period?" "How is this different than your period?"     Maybe back in June of 2019 4. REGULARITY: "How regular are your periods?"     irregular 5. ABDOMINAL PAIN: "Do you have any pain?" "How bad is the pain?"  (e.g., Scale 1-10; mild, moderate, or severe)   - MILD (1-3): doesn't interfere with normal activities, abdomen soft and not tender to touch    - MODERATE (4-7): interferes with normal activities or awakens from  sleep, tender to touch    - SEVERE (8-10): excruciating pain, doubled over, unable to do any normal activities      9 6. PREGNANCY: "Could you be pregnant?" "Are you sexually active?" "Did you recently give birth?"     Yes sexually active 7. BREASTFEEDING: "Are you breastfeeding?"     No 8. HORMONES: "Are you taking any hormone medications, prescription or OTC?" (e.g., birth control pills, estrogen)     No 9. BLOOD THINNERS: "Do you take any blood thinners?" (e.g., Coumadin/warfarin, Pradaxa/dabigatran, aspirin)     No 10. CAUSE: "What do you think is causing the bleeding?" (e.g., recent gyn surgery, recent gyn procedure; known bleeding disorder, cervical cancer, polycystic ovarian disease, fibroids)         History of PCOS 11. HEMODYNAMIC STATUS: "Are you weak or feeling lightheaded?" If so, ask: "Can you stand and walk normally?"        No 12. OTHER SYMPTOMS: "What other symptoms are you having with the bleeding?" (e.g., passed tissue, vaginal discharge, fever, menstrual-type cramps)       No  Protocols used: VAGINAL BLEEDING - ABNORMAL-A-AH

## 2018-05-30 NOTE — Telephone Encounter (Signed)
[facility] and [facility] is aware of the pt's situation and has reach out to her.

## 2018-06-11 ENCOUNTER — Encounter: Payer: Self-pay | Admitting: *Deleted

## 2018-06-12 ENCOUNTER — Encounter: Payer: Managed Care, Other (non HMO) | Admitting: Obstetrics and Gynecology

## 2018-06-12 NOTE — Telephone Encounter (Signed)
Prior to the patient's appointment, she was sent a MyChart message, but the patient arrived for her appointment today, and was then informed that due to the nature of her referral that she needed to be seen by the doctor's side of the practice.  The patient was then rescheduled and notified of this, appeared upset, voiced that it was inconsiderate to not be notified; however, she was notified prior to the visit, but did not see her MyChart message from [facility], and then voiced understanding, and I made her an  appointment for Tuesday at 10:45 AM with Dr. Logan Bores, due to Dr. Valentino Saxon being out of the office with the flu.  The patient also did NOT want a refund of today's copay, and asked to hold the copay from today's visit for her upcoming Tuesday appt w/ Dr. Logan Bores.  **NO FURTHER ACTION NEEDED AT THIS TIME**

## 2018-06-13 ENCOUNTER — Telehealth: Payer: Self-pay

## 2018-06-13 NOTE — Telephone Encounter (Signed)
LM on 06/12/18 at 1:48 for pt to contact office. Also sent my chart message.  Pt returned my call to discuss her dissatisfaction with her care.   Pt states it started in 03/2018. She had a hard time making an appointment and getting her calls returned. She states she had medication sent to the pharmacy and did not know anything about it.   Upon reviewing her chart it seems [facility] did respond to patient in a timely fashion but the patient did not read her my chart message  until days later. Pt also discussed not getting a return call back. I did apologies to her for not getting a call back in 24hours. I made her aware that the nurse relyed on the my chart message to inform her of [facility] recommendations. We decided that a phone call is better for her. I have made a note in her chart that pt prefers a call over my chart responses.    Pt is to follow up with Dr. Logan Bores next week.

## 2018-06-17 ENCOUNTER — Encounter: Payer: Managed Care, Other (non HMO) | Admitting: Obstetrics and Gynecology

## 2018-06-20 ENCOUNTER — Ambulatory Visit: Payer: Managed Care, Other (non HMO) | Admitting: Obstetrics and Gynecology

## 2018-06-20 ENCOUNTER — Encounter: Payer: Self-pay | Admitting: Obstetrics and Gynecology

## 2018-06-20 VITALS — BP 120/78 | HR 91 | Ht 63.0 in | Wt 324.4 lb

## 2018-06-20 DIAGNOSIS — E282 Polycystic ovarian syndrome: Secondary | ICD-10-CM | POA: Diagnosis not present

## 2018-06-20 DIAGNOSIS — N938 Other specified abnormal uterine and vaginal bleeding: Secondary | ICD-10-CM | POA: Diagnosis not present

## 2018-06-20 MED ORDER — DESOGESTREL-ETHINYL ESTRADIOL 0.15-0.02/0.01 MG (21/5) PO TABS: 1.0000 | ORAL_TABLET | Freq: Every day | ORAL | 2 refills | Status: DC

## 2018-06-20 NOTE — Progress Notes (Signed)
HPI:      Katherine Huynh is a 23 y.o. G2P0 who LMP was Patient's last menstrual period was 04/08/2018 (approximate).  Subjective:   She presents today to discuss her irregular heavy menstrual bleeding, infertility, possible PCO. She has had irregular cycles since discontinuing OCPs 3 years ago.  She states that sometimes she goes a whole year without periods.  She desires pregnancy at this time.  A recent ultrasound reveals evidence of polycystic ovaries.    Hx: The following portions of the patient's history were reviewed and updated as appropriate:             She  has a past medical history of Depression, Fam hx-ischem heart disease (09/15/2015), Heart murmur, Morbid obesity (HCC) (02/17/2016), and Polycystic disease, ovaries. She does not have any pertinent problems on file. She  has a past surgical history that includes No past surgeries. Her family history includes ADD / ADHD in her sister; Alcohol abuse in her sister; Cancer in her maternal grandmother; Heart disease in her maternal grandfather and paternal grandfather. She  reports that she has never smoked. She has never used smokeless tobacco. She reports current alcohol use of about 6.0 standard drinks of alcohol per week. She reports previous drug use. Drug: Marijuana. She has a current medication list which includes the following prescription(s): ibuprofen, medroxyprogesterone, metformin, desogestrel-ethinyl estradiol, propranolol, and sertraline. She is allergic to cinnamon; erythromycin; and sulfa antibiotics.       Review of Systems:  Review of Systems  Constitutional: Denied constitutional symptoms, night sweats, recent illness, fatigue, fever, insomnia and weight loss.  Eyes: Denied eye symptoms, eye pain, photophobia, vision change and visual disturbance.  Ears/Nose/Throat/Neck: Denied ear, nose, throat or neck symptoms, hearing loss, nasal discharge, sinus congestion and sore throat.  Cardiovascular: Denied cardiovascular  symptoms, arrhythmia, chest pain/pressure, edema, exercise intolerance, orthopnea and palpitations.  Respiratory: Denied pulmonary symptoms, asthma, pleuritic pain, productive sputum, cough, dyspnea and wheezing.  Gastrointestinal: Denied, gastro-esophageal reflux, melena, nausea and vomiting.  Genitourinary: Denied genitourinary symptoms including symptomatic vaginal discharge, pelvic relaxation issues, and urinary complaints.  Musculoskeletal: Denied musculoskeletal symptoms, stiffness, swelling, muscle weakness and myalgia.  Dermatologic: Denied dermatology symptoms, rash and scar.  Neurologic: Denied neurology symptoms, dizziness, headache, neck pain and syncope.  Psychiatric: Denied psychiatric symptoms, anxiety and depression.  Endocrine: Denied endocrine symptoms including hot flashes and night sweats.   Meds:   Current Outpatient Medications on File Prior to Visit  Medication Sig Dispense Refill  . ibuprofen (ADVIL,MOTRIN) 800 MG tablet Take 1 tablet (800 mg total) by mouth every 8 (eight) hours as needed. 90 tablet 0  . medroxyPROGESTERone (PROVERA) 10 MG tablet Take 1 tablet (10 mg total) by mouth daily. Use for ten days 10 tablet 2  . metFORMIN (GLUCOPHAGE) 500 MG tablet Take 500 mg by mouth 2 (two) times daily with a meal.    . propranolol (INDERAL) 10 MG tablet Take 1 tablet (10 mg total) by mouth 3 (three) times daily as needed. For severe anxiety sx (Patient not taking: Reported on 05/13/2018) 270 tablet 0  . sertraline (ZOLOFT) 50 MG tablet Take 1.5 tablets (75 mg total) by mouth daily. (Patient not taking: Reported on 05/13/2018) 135 tablet 0   No current facility-administered medications on file prior to visit.     Objective:     Vitals:   06/20/18 1059  BP: 120/78  Pulse: 91              Ultrasound results reviewed directly  with the patient  Assessment:    G2P0 Patient Active Problem List   Diagnosis Date Noted  . Severe dysmenorrhea 05/30/2018  . Moderate  episode of recurrent major depressive disorder (HCC) 05/30/2018  . Murmur, cardiac 10/12/2016  . Polycystic disease, ovaries 10/02/2016  . 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 Morbid obesity (HCC) 02/17/2016  . Difficulty controlling anger 02/17/2016  . Binge eating disorder 02/17/2016  . Athlete's foot, left 10/10/2015  . Personal history of sulfonamide allergy 09/15/2015  . Onychomycosis 09/15/2015  . Fam hx-ischem heart disease 09/15/2015     1. PCO (polycystic ovaries)   2. Dysfunctional uterine bleeding     Her irregular and heavy bleeding is consistent with the possible diagnosis of PCO.  It is likely that she is not ovulatory.   Plan:            1.  PCO labs  2.  Begin OCPs with the expectation that after 3 months of OCPs she will discontinue them and immediately attempt pregnancy.  Orders No orders of the defined types were placed in this encounter.    Meds ordered this encounter  Medications  . desogestrel-ethinyl estradiol (MIRCETTE) 0.15-0.02/0.01 MG (21/5) tablet    Sig: Take 1 tablet by mouth at bedtime.    Dispense:  1 Package    Refill:  2      F/U  Return in about 3 months (around 09/18/2018) for We will contact her with any abnormal test results. I spent 18 minutes involved in the care of this patient of which greater than 50% was spent discussing PCO diagnosis and management, use of OCPs for infertility and PCO, timing of intercourse, history of irregular bleeding, likely oligo ovulation.  All questions answered.  Elonda Husky, M.D. 06/20/2018 11:35 AM

## 2018-06-20 NOTE — Progress Notes (Signed)
Patient comes in today for vaginal bleeding. She has had heavy bleeding from November to about 2 weeks ago. Patient was put on Provera and this has stopped her bleeding.

## 2018-07-17 ENCOUNTER — Ambulatory Visit: Payer: Self-pay | Admitting: Family Medicine

## 2018-07-18 ENCOUNTER — Other Ambulatory Visit (HOSPITAL_COMMUNITY)
Admission: RE | Admit: 2018-07-18 | Discharge: 2018-07-18 | Disposition: A | Discharge status: Home / Self Care | Payer: Managed Care, Other (non HMO) | Source: Ambulatory Visit | Attending: Family Medicine | Admitting: Family Medicine

## 2018-07-18 ENCOUNTER — Encounter: Payer: Self-pay | Admitting: Family Medicine

## 2018-07-18 ENCOUNTER — Ambulatory Visit: Payer: Managed Care, Other (non HMO) | Admitting: Family Medicine

## 2018-07-18 DIAGNOSIS — Z113 Encounter for screening for infections with a predominantly sexual mode of transmission: Secondary | ICD-10-CM | POA: Insufficient documentation

## 2018-07-18 DIAGNOSIS — F331 Major depressive disorder, recurrent, moderate: Secondary | ICD-10-CM | POA: Diagnosis not present

## 2018-07-18 DIAGNOSIS — F411 Generalized anxiety disorder: Secondary | ICD-10-CM

## 2018-07-18 DIAGNOSIS — E282 Polycystic ovarian syndrome: Secondary | ICD-10-CM

## 2018-07-18 MED ORDER — METFORMIN HCL ER 750 MG PO TB24: 1500.0000 mg | ORAL_TABLET | Freq: Every day | ORAL | 0 refills | Status: DC

## 2018-07-18 NOTE — Addendum Note (Signed)
Addended by: Cynda Familia on: 07/18/2018 04:08 PM   Modules accepted: Orders

## 2018-07-18 NOTE — Progress Notes (Signed)
Name: Katherine Huynh   MRN: [id number]    DOB: 12-Apr-1996   Date:07/18/2018       Progress Note  Subjective  Chief Complaint  Chief Complaint  Patient presents with  . STD Screening    Has been dating a new partner recently and wanted to each one to have STD screening before coming sexual active.    HPI  Obesity: she states her weight was 180 lbs prior to HS graduation, she went to college and did not gain any weight on the first year, but on her second year of college she gained 80 lbs. She was checked for thyroid problems and it was normal. She states level of activity did not change at the time. She has tried phentermine but it caused severe nausea and she could not eat at all. She does not want to try medication that would affect ability to conceive. She as PCOS and states regular metformin causes nausea, we will try sustained release today . Discussed referral to dietician  STI screen: she has a fiancee, she is not monogamous - her fiancee lives in [place] and they have an agreement that they can have sex with anyone when they are not together. She is planning on having sex with her first boyfriend who she just re-connected and they are both being checked for STI. She uses condoms. Discussed Truvada and Dyscovey. She denies vaginal discharge or vulva lesion    Patient Active Problem List   Diagnosis Date Noted  . Severe dysmenorrhea 05/30/2018  . Moderate episode of recurrent major depressive disorder (HCC) 05/30/2018  . Murmur, cardiac 10/12/2016  . Polycystic disease, ovaries 10/02/2016  . 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 Morbid obesity (HCC) 02/17/2016  . Difficulty controlling anger 02/17/2016  . Binge eating disorder 02/17/2016  . Athlete's foot, left 10/10/2015  . Personal history of sulfonamide allergy 09/15/2015  . Onychomycosis 09/15/2015  . Fam hx-ischem heart disease 09/15/2015    Past Surgical History:  Procedure Laterality Date  . NO PAST SURGERIES       Family History  Problem Relation Age of Onset  . Cancer Maternal Grandmother        breast  . Heart disease Maternal Grandfather   . Heart disease Paternal Grandfather   . Alcohol abuse Sister   . ADD / ADHD Sister     Social History   Socioeconomic History  . Marital status: Significant Other    Spouse name: [name]   . Number of children: 0  . Years of education: Not on file  . Highest education level: High school graduate  Occupational History  . Occupation: Music therapist  . Financial resource strain: Not hard at all  . Food insecurity:    Worry: Never true    Inability: Never true  . Transportation needs:    Medical: No    Non-medical: No  Tobacco Use  . Smoking status: Never Smoker  . Smokeless tobacco: Never Used  Substance and Sexual Activity  . Alcohol use: Yes    Alcohol/week: 6.0 standard drinks    Types: 3 Glasses of wine, 2 Cans of beer, 1 Shots of liquor per week    Comment: occas  . Drug use: Not Currently    Types: Marijuana    Comment: last used about a month ago  . Sexual activity: Yes    Birth control/protection: None  Lifestyle  . Physical activity:    Days per week: 0 days  Minutes per session: 0 min  . Stress: Very much  Relationships  . Social connections:    Talks on phone: Not on file    Gets together: Not on file    Attends religious service: Never    Active member of club or organization: No    Attends meetings of clubs or organizations: Never    Relationship status: Never married  . Intimate partner violence:    Fear of current or ex partner: No    Emotionally abused: No    Physically abused: No    Forced sexual activity: No  Other Topics Concern  . Not on file  Social History Narrative   Lives with fiancee, they live together, works full time.      Current Outpatient Medications:  .  desogestrel-ethinyl estradiol (MIRCETTE) 0.15-0.02/0.01 MG (21/5) tablet, Take 1 tablet by mouth at bedtime., Disp: 1  Package, Rfl: 2 .  ibuprofen (ADVIL,MOTRIN) 800 MG tablet, Take 1 tablet (800 mg total) by mouth every 8 (eight) hours as needed., Disp: 90 tablet, Rfl: 0 .  propranolol (INDERAL) 10 MG tablet, Take 1 tablet (10 mg total) by mouth 3 (three) times daily as needed. For severe anxiety sx, Disp: 270 tablet, Rfl: 0 .  medroxyPROGESTERone (PROVERA) 10 MG tablet, Take 1 tablet (10 mg total) by mouth daily. Use for ten days (Patient not taking: Reported on 07/18/2018), Disp: 10 tablet, Rfl: 2 .  metFORMIN (GLUCOPHAGE) 500 MG tablet, Take 500 mg by mouth 2 (two) times daily with a meal., Disp: , Rfl:  .  sertraline (ZOLOFT) 50 MG tablet, Take 1.5 tablets (75 mg total) by mouth daily. (Patient not taking: Reported on 07/18/2018), Disp: 135 tablet, Rfl: 0  Allergies  Allergen Reactions  . Cinnamon Itching  . Erythromycin Other (See Comments)    Unknown reaction, happened in childhood  . Sulfa Antibiotics Rash    I personally reviewed active problem list, medication list, allergies, family history, social history with the patient/caregiver today.   ROS  Ten systems reviewed and is negative except as mentioned in HPI   Objective  Vitals:   07/18/18 1318  BP: 126/84  Pulse: 97  Resp: 16  Temp: 98.1 F (36.7 C)  TempSrc: Oral  SpO2: 98%  Weight: (!) 331 lb 1.6 oz (150.2 kg)  Height: 5\' 3"  (1.6 m)    Body mass index is 58.65 kg/m.  Physical Exam  Constitutional: Patient appears well-developed and well-nourished. Obese  No distress.  HEENT: head atraumatic, normocephalic, pupils equal and reactive to light, , neck supple, throat within normal limits Cardiovascular: Normal rate, regular rhythm and normal heart sounds.  1 plus SEM heard on 2nd right intercostal space murmur heard. No BLE edema. Pulmonary/Chest: Effort normal and breath sounds normal. No respiratory distress. Abdominal: Soft.  There is no tenderness. Psychiatric: Patient has a normal mood and affect. behavior is normal.  Judgment and thought content normal.  Recent Results (from the past 2160 hour(s))  Beta hCG quant (ref lab)     Status: None   Collection Time: 05/13/18  3:45 PM  Result Value Ref Range   hCG Quant <1 mIU/mL    Comment:                      Female (Non-pregnant)    0 -     5                             (  Postmenopausal)  0 -     8                      Female (Pregnant)                      Weeks of Gestation                              3                6 -    71                              4               10 -   750                              5              217 -  7138                              6              158 - [place]                              7             [id number] -[id number]                              8            [id number] -[id number]                              9            [place]  17             8175 - 55868                             18             8099 - 58176 Roche E CLIA methodology   Thyroid Panel With TSH     Status: None   Collection Time: 05/13/18  3:45 PM  Result Value Ref Range   TSH 1.890 0.450 - 4.500 uIU/mL   T4, Total 6.4 4.5 - 12.0 ug/dL   T3 Uptake Ratio 24 24 - 39 %   Free Thyroxine Index 1.5 1.2 - 4.9  CBC w/Diff     Status: None   Collection Time: 05/13/18  3:45 PM  Result Value Ref Range   WBC 6.8 3.4 - 10.8 x10E3/uL   RBC 4.44 3.77 - 5.28 x10E6/uL   Hemoglobin 11.9 11.1 - 15.9 g/dL   Hematocrit 16.1 09.6 - 46.6 %   MCV 82 79 - 97 fL   MCH 26.8 26.6 - 33.0 pg   MCHC 32.7 31.5 - 35.7 g/dL   RDW 04.5 40.9 - 81.1 %    Comment: **Effective May 19, 2018, the RDW pediatric reference**   interval will be removed and the adult reference  interval   will be changing to:                             Female 11.7 - 15.4                                                      Female 11.6 - 15.4    Platelets 319 150 - 450 x10E3/uL   Neutrophils 58 Not Estab. %   Lymphs 34 Not Estab. %   Monocytes 6 Not Estab. %   Eos 2 Not Estab. %   Basos 0 Not Estab. %   Neutrophils Absolute 3.9 1.4 - 7.0 x10E3/uL   Lymphocytes Absolute 2.3 0.7 - 3.1 x10E3/uL   Monocytes Absolute 0.4 0.1 - 0.9 x10E3/uL   EOS (ABSOLUTE) 0.1 0.0 - 0.4 x10E3/uL   Basophils Absolute 0.0 0.0 - 0.2 x10E3/uL   Immature Granulocytes 0 Not Estab. %   Immature Grans (Abs) 0.0 0.0 - 0.1 x10E3/uL  Iron     Status: None   Collection Time: 05/13/18  3:45 PM  Result Value Ref Range   Iron 62 27 - 159 ug/dL  FSH/LH     Status: None   Collection Time: 05/13/18  3:45 PM  Result Value Ref Range   LH 10.4 mIU/mL    Comment:                     Adult Female:                       Follicular phase      2.4 -  12.6                       Ovulation phase      14.0 -  95.6  Luteal phase          1.0 -  11.4                       Postmenopausal        7.7 -  58.5    FSH 4.7 mIU/mL    Comment:                     Adult Female:                       Follicular phase      3.5 -  12.5                       Ovulation phase       4.7 -  21.5                       Luteal phase          1.7 -   7.7                       Postmenopausal       25.8 - 134.8       PHQ2/9: Depression screen [facility] 2/9 07/18/2018 05/30/2018 02/27/2018 12/04/2017 07/26/2017  Decreased Interest 0  Down, Depressed, Hopeless PHQ - 2 Score Altered sleeping Tired, decreased energy Change in appetite Feeling bad or failure about yourself  Trouble concentrating Moving slowly or fidgety/restless 0 1 0 0 0  Suicidal thoughts 0 1 0 0 1  PHQ-9 Score Difficult doing work/chores Somewhat difficult  Extremely dIfficult Extremely dIfficult Not difficult at all Extremely dIfficult  Some recent data might be hidden    Fall Risk: Fall Risk  07/18/2018 05/30/2018 02/27/2018 12/04/2017 07/26/2017  Falls in the past year? 0 0 No No No  Number falls in past yr: 0 - - - -  Injury with Fall? 0 - - - -    Functional Status Survey: Is the patient deaf or have difficulty hearing?: No Does the patient have difficulty seeing, even when wearing glasses/contacts?: Yes Does the patient have difficulty concentrating, remembering, or making decisions?: No Does the patient have difficulty walking or climbing stairs?: No Does the patient have difficulty dressing or bathing?: No Does the patient have difficulty doing errands alone such as visiting a doctor's office or shopping?: No    Assessment & Plan  1. Screen for STD (sexually transmitted disease)  - Cervicovaginal ancillary only - HIV Antibody (routine testing w rflx) - RPR - Hepatitis panel, acute  2. Morbid obesity (HCC)  - Amb ref to Medical Nutrition Therapy-MNT  3. PCOS (polycystic ovarian syndrome)  - metFORMIN (GLUCOPHAGE-XR) 750 MG 24 hr tablet; Take 2 tablets (1,500 mg total) by mouth daily with breakfast.  Dispense: 180 tablet; Refill: 0

## 2018-07-19 LAB — RPR: RPR Ser Ql: NONREACTIVE

## 2018-07-19 LAB — HEPATITIS PANEL, ACUTE
Hep A IgM: NEGATIVE
Hep B C IgM: NEGATIVE
Hep C Virus Ab: 0.2 s/co ratio (ref 0.0–0.9)
Hepatitis B Surface Ag: NEGATIVE

## 2018-07-19 LAB — HIV ANTIBODY (ROUTINE TESTING W REFLEX): HIV Screen 4th Generation wRfx: NONREACTIVE

## 2018-07-22 LAB — CERVICOVAGINAL ANCILLARY ONLY
Chlamydia: NEGATIVE
Neisseria Gonorrhea: NEGATIVE
Trichomonas: NEGATIVE

## 2018-08-28 ENCOUNTER — Ambulatory Visit: Payer: Managed Care, Other (non HMO) | Admitting: Dietician

## 2018-09-18 ENCOUNTER — Encounter: Payer: Self-pay | Admitting: Obstetrics and Gynecology

## 2018-09-18 ENCOUNTER — Other Ambulatory Visit: Payer: Self-pay

## 2018-09-18 DIAGNOSIS — E282 Polycystic ovarian syndrome: Secondary | ICD-10-CM

## 2018-09-18 DIAGNOSIS — N938 Other specified abnormal uterine and vaginal bleeding: Secondary | ICD-10-CM

## 2018-09-18 MED ORDER — DESOGESTREL-ETHINYL ESTRADIOL 0.15-0.02/0.01 MG (21/5) PO TABS: 1.0000 | ORAL_TABLET | Freq: Every day | ORAL | 2 refills | Status: DC

## 2018-10-17 ENCOUNTER — Other Ambulatory Visit: Payer: Managed Care, Other (non HMO)

## 2018-10-21 ENCOUNTER — Other Ambulatory Visit: Payer: Managed Care, Other (non HMO)

## 2018-10-21 ENCOUNTER — Other Ambulatory Visit: Payer: Self-pay

## 2018-10-24 LAB — TSH: TSH: 2.37 u[IU]/mL (ref 0.450–4.500)

## 2018-10-24 LAB — TESTOSTERONE, FREE, TOTAL, SHBG
Sex Hormone Binding: 61.6 nmol/L (ref 24.6–122.0)
Testosterone, Free: 2.5 pg/mL (ref 0.0–4.2)
Testosterone: 42 ng/dL (ref 8–48)

## 2018-10-24 LAB — INSULIN, RANDOM: INSULIN: 47.9 u[IU]/mL — ABNORMAL HIGH (ref 2.6–24.9)

## 2018-10-24 LAB — FSH/LH
FSH: 6.1 m[IU]/mL
LH: 6.7 m[IU]/mL

## 2018-10-24 LAB — GLUCOSE, FASTING: Glucose, Plasma: 86 mg/dL (ref 65–99)

## 2018-10-24 LAB — PROLACTIN: Prolactin: 10.4 ng/mL (ref 4.8–23.3)

## 2018-10-24 LAB — DHEA-SULFATE: DHEA-SO4: 303.1 ug/dL (ref 110.0–431.7)

## 2018-10-30 ENCOUNTER — Telehealth: Payer: Self-pay

## 2018-10-30 NOTE — Telephone Encounter (Signed)
Pt called no answer LM via voicemail to call the office for prescreening. 

## 2018-10-30 NOTE — Progress Notes (Signed)
Pt present today for lab results. Pt stated that she think she may have BV again.

## 2018-10-31 ENCOUNTER — Ambulatory Visit: Payer: Managed Care, Other (non HMO) | Admitting: Obstetrics and Gynecology

## 2018-10-31 ENCOUNTER — Encounter: Payer: Self-pay | Admitting: Obstetrics and Gynecology

## 2018-10-31 ENCOUNTER — Other Ambulatory Visit: Payer: Self-pay

## 2018-10-31 VITALS — BP 117/70 | HR 80 | Ht 63.0 in | Wt 327.3 lb

## 2018-10-31 DIAGNOSIS — B9689 Other specified bacterial agents as the cause of diseases classified elsewhere: Secondary | ICD-10-CM | POA: Diagnosis not present

## 2018-10-31 DIAGNOSIS — N760 Acute vaginitis: Secondary | ICD-10-CM

## 2018-10-31 MED ORDER — METRONIDAZOLE 500 MG PO TABS: 500.0000 mg | ORAL_TABLET | Freq: Two times a day (BID) | ORAL | 0 refills | Status: AC

## 2018-10-31 NOTE — Progress Notes (Signed)
HPI:      [name] is a 23 y.o. G2P0 who LMP was Patient's last menstrual period was 10/06/2018.  Subjective:   She presents today with complaint of vaginal discharge and odor.  She denies other symptoms.  She denies new sexual partners.  She does not want to be tested for STDs today. She has been on OCPs for PCO which she took for 3 months.  She has now been 2 months off of OCPs and she is cycling regularly.  She is also taking metformin for insulin resistance.  She is not sure that she is timing intercourse correctly and would like to discuss this as she is attempting pregnancy.    Hx: The following portions of the patient's history were reviewed and updated as appropriate:             She  has a past medical history of Depression, Fam hx-ischem heart disease (09/15/2015), Heart murmur, Morbid obesity ([place]) (02/17/2016), and Polycystic disease, ovaries. She does not have any pertinent problems on file. She  has a past surgical history that includes No past surgeries. Her family history includes ADD / ADHD in her sister; Alcohol abuse in her sister; Cancer in her maternal grandmother; Healthy in her father and mother; Heart disease in her maternal grandfather and paternal grandfather. She  reports that she has never smoked. She has never used smokeless tobacco. She reports current alcohol use of about 6.0 standard drinks of alcohol per week. She reports previous drug use. Drug: Marijuana. She has a current medication list which includes the following prescription(s): ibuprofen, metformin, propranolol, desogestrel-ethinyl estradiol, and metronidazole. She is allergic to cinnamon; erythromycin; and sulfa antibiotics.       Review of Systems:  Review of Systems  Constitutional: Denied constitutional symptoms, night sweats, recent illness, fatigue, fever, insomnia and weight loss.  Eyes: Denied eye symptoms, eye pain, photophobia, vision change and visual disturbance.  Ears/Nose/Throat/Neck:  Denied ear, nose, throat or neck symptoms, hearing loss, nasal discharge, sinus congestion and sore throat.  Cardiovascular: Denied cardiovascular symptoms, arrhythmia, chest pain/pressure, edema, exercise intolerance, orthopnea and palpitations.  Respiratory: Denied pulmonary symptoms, asthma, pleuritic pain, productive sputum, cough, dyspnea and wheezing.  Gastrointestinal: Denied, gastro-esophageal reflux, melena, nausea and vomiting.  Genitourinary: See HPI for additional information.  Musculoskeletal: Denied musculoskeletal symptoms, stiffness, swelling, muscle weakness and myalgia.  Dermatologic: Denied dermatology symptoms, rash and scar.  Neurologic: Denied neurology symptoms, dizziness, headache, neck pain and syncope.  Psychiatric: Denied psychiatric symptoms, anxiety and depression.  Endocrine: Denied endocrine symptoms including hot flashes and night sweats.   Meds:   Current Outpatient Medications on File Prior to Visit  Medication Sig Dispense Refill  . ibuprofen (ADVIL,MOTRIN) 800 MG tablet Take 1 tablet (800 mg total) by mouth every 8 (eight) hours as needed. 90 tablet 0  . metFORMIN (GLUCOPHAGE-XR) 750 MG 24 hr tablet Take 2 tablets (1,500 mg total) by mouth daily with breakfast. 180 tablet 0  . propranolol (INDERAL) 10 MG tablet Take 1 tablet (10 mg total) by mouth 3 (three) times daily as needed. For severe anxiety sx 270 tablet 0  . desogestrel-ethinyl estradiol (MIRCETTE) 0.15-0.02/0.01 MG (21/5) tablet Take 1 tablet by mouth at bedtime. (Patient not taking: Reported on 10/31/2018) 1 Package 2   No current facility-administered medications on file prior to visit.     Objective:     Vitals:   10/31/18 1129  BP: 117/70  Pulse: 80  Physical examination   Pelvic:   Vulva: Normal appearance.  No lesions.  Vagina: No lesions or abnormalities noted.  Support: Normal pelvic support.  Urethra No masses tenderness or scarring.  Meatus Normal size without  lesions or prolapse.  Cervix: Normal appearance.  No lesions.  Anus: Normal exam.  No lesions.  Perineum: Normal exam.  No lesions.        Bimanual   Uterus: Normal size.  Non-tender.  Mobile.  AV.  Adnexae: No masses.  Non-tender to palpation.  Cul-de-sac: Negative for abnormality.   Exam limited by patient body habitus  WET PREP: clue cells: present, KOH (yeast): negative, odor: present and trichomoniasis: negative Ph:  > 4.5   Assessment:    G2P0 Patient Active Problem List   Diagnosis Date Noted  . Severe dysmenorrhea 05/30/2018  . Moderate episode of recurrent major depressive disorder (HCC) 05/30/2018  . Murmur, cardiac 10/12/2016  . Polycystic disease, ovaries 10/02/2016  . 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 Morbid obesity (HCC) 02/17/2016  . Difficulty controlling anger 02/17/2016  . Binge eating disorder 02/17/2016  . Athlete's foot, left 10/10/2015  . Personal history of sulfonamide allergy 09/15/2015  . Onychomycosis 09/15/2015  . Fam hx-ischem heart disease 09/15/2015     1. Bacterial vulvovaginitis        Plan:            1.  Flagyl  2.  We have discussed ovulation predictor kits and menstrual cycle timing of intercourse.  Use of prenatal vitamins discussed.  3.  Previous lab work for PCO discussed in detail especially in regard to insulin resistance.  Patient continues to take metformin. Orders No orders of the defined types were placed in this encounter.    Meds ordered this encounter  Medications  . metroNIDAZOLE (FLAGYL) 500 MG tablet    Sig: Take 1 tablet (500 mg total) by mouth 2 (two) times daily for 7 days.    Dispense:  14 tablet    Refill:  0      F/U  Return in about 6 months (around 05/02/2019). I spent 18 minutes involved in the care of this patient of which greater than 50% was spent discussing bacterial vaginosis treatment, timing of intercourse, use of ovulation predictor kits, PCO and insulin resistance.  Elonda Huskyavid  J. Evans, M.D. 10/31/2018 11:59 AM

## 2018-11-20 ENCOUNTER — Encounter: Payer: Self-pay | Admitting: Obstetrics and Gynecology

## 2018-11-20 ENCOUNTER — Ambulatory Visit: Payer: Managed Care, Other (non HMO) | Admitting: Obstetrics and Gynecology

## 2018-11-20 ENCOUNTER — Other Ambulatory Visit: Payer: Self-pay

## 2018-11-20 VITALS — BP 138/86 | HR 81 | Ht 64.0 in | Wt 330.8 lb

## 2018-11-20 DIAGNOSIS — Z30011 Encounter for initial prescription of contraceptive pills: Secondary | ICD-10-CM

## 2018-11-20 DIAGNOSIS — Z6841 Body Mass Index (BMI) 40.0 and over, adult: Secondary | ICD-10-CM

## 2018-11-20 DIAGNOSIS — E282 Polycystic ovarian syndrome: Secondary | ICD-10-CM | POA: Diagnosis not present

## 2018-11-20 DIAGNOSIS — E8881 Metabolic syndrome: Secondary | ICD-10-CM | POA: Diagnosis not present

## 2018-11-20 MED ORDER — DESOGESTREL-ETHINYL ESTRADIOL 0.15-0.02/0.01 MG (21/5) PO TABS: 1.0000 | ORAL_TABLET | Freq: Every day | ORAL | 0 refills | Status: DC

## 2018-11-20 MED ORDER — METFORMIN HCL 500 MG PO TABS: 500.0000 mg | ORAL_TABLET | Freq: Two times a day (BID) | ORAL | 1 refills | Status: DC

## 2018-11-20 NOTE — Progress Notes (Signed)
HPI:      [name] is a 23 y.o. G2P0 who LMP was Patient's last menstrual period was 10/06/2018.  Subjective:   She presents today to discuss infertility issues.  She has known PCO with insulin resistance.  She took OCPs for 2 months and then had 2 regular cycles without resultant pregnancy.  She is now skipped a menstrual cycle.  She did ovulation predictor kits and states that they never turned positive.  She has also discontinued her metformin because the pharmacy could not get it anymore. Her current partner has fathered children before but the last one was 4 years ago.    Hx: The following portions of the patient's history were reviewed and updated as appropriate:             She  has a past medical history of Depression, Fam hx-ischem heart disease (09/15/2015), Heart murmur, Morbid obesity (HCC) (02/17/2016), and Polycystic disease, ovaries. She does not have any pertinent problems on file. She  has a past surgical history that includes No past surgeries. Her family history includes ADD / ADHD in her sister; Alcohol abuse in her sister; Cancer in her maternal grandmother; Healthy in her father and mother; Heart disease in her maternal grandfather and paternal grandfather. She  reports that she has never smoked. She has never used smokeless tobacco. She reports current alcohol use of about 6.0 standard drinks of alcohol per week. She reports previous drug use. Drug: Marijuana. She has a current medication list which includes the following prescription(s): ibuprofen, desogestrel-ethinyl estradiol, metformin, and propranolol. She is allergic to cinnamon; erythromycin; and sulfa antibiotics.       Review of Systems:  Review of Systems  Constitutional: Denied constitutional symptoms, night sweats, recent illness, fatigue, fever, insomnia and weight loss.  Eyes: Denied eye symptoms, eye pain, photophobia, vision change and visual disturbance.  Ears/Nose/Throat/Neck: Denied ear, nose,  throat or neck symptoms, hearing loss, nasal discharge, sinus congestion and sore throat.  Cardiovascular: Denied cardiovascular symptoms, arrhythmia, chest pain/pressure, edema, exercise intolerance, orthopnea and palpitations.  Respiratory: Denied pulmonary symptoms, asthma, pleuritic pain, productive sputum, cough, dyspnea and wheezing.  Gastrointestinal: Denied, gastro-esophageal reflux, melena, nausea and vomiting.  Genitourinary: Denied genitourinary symptoms including symptomatic vaginal discharge, pelvic relaxation issues, and urinary complaints.  Musculoskeletal: Denied musculoskeletal symptoms, stiffness, swelling, muscle weakness and myalgia.  Dermatologic: Denied dermatology symptoms, rash and scar.  Neurologic: Denied neurology symptoms, dizziness, headache, neck pain and syncope.  Psychiatric: Denied psychiatric symptoms, anxiety and depression.  Endocrine: Denied endocrine symptoms including hot flashes and night sweats.   Meds:   Current Outpatient Medications on File Prior to Visit  Medication Sig Dispense Refill  . ibuprofen (ADVIL,MOTRIN) 800 MG tablet Take 1 tablet (800 mg total) by mouth every 8 (eight) hours as needed. 90 tablet 0  . propranolol (INDERAL) 10 MG tablet Take 1 tablet (10 mg total) by mouth 3 (three) times daily as needed. For severe anxiety sx (Patient not taking: Reported on 11/20/2018) 270 tablet 0   No current facility-administered medications on file prior to visit.     Objective:     Vitals:   11/20/18 0829  BP: 138/86  Pulse: 81                Assessment:    G2P0 Patient Active Problem List   Diagnosis Date Noted  . Severe dysmenorrhea 05/30/2018  . Moderate episode of recurrent major depressive disorder (HCC) 05/30/2018  . Murmur, cardiac 10/12/2016  . Polycystic  disease, ovaries 10/02/2016  . Depression, major, recurrent ([place]) 02/17/2016  . Morbid obesity ([place]) 02/17/2016  . Difficulty controlling anger 02/17/2016  . Binge eating  disorder 02/17/2016  . Athlete's foot, left 10/10/2015  . Personal history of sulfonamide allergy 09/15/2015  . Onychomycosis 09/15/2015  . Fam hx-ischem heart disease 09/15/2015     1. PCO (polycystic ovaries)   2. Initiation of OCP (BCP)   3. BMI 50.0-59.9, adult ([place])   4. Insulin resistance     Patient likely not ovulatory after OCP discontinuation.   Plan:            1.  Repeat OCPs for 3 months.  At the end of that time will start letrozole to try to induce ovulation.  2.  Semen analysis for partner  3.  Restart metformin-1000 mg/day. Orders No orders of the defined types were placed in this encounter.    Meds ordered this encounter  Medications  . metFORMIN (GLUCOPHAGE) 500 MG tablet    Sig: Take 1 tablet (500 mg total) by mouth 2 (two) times daily with a meal.    Dispense:  180 tablet    Refill:  1  . desogestrel-ethinyl estradiol (MIRCETTE) 0.15-0.02/0.01 MG (21/5) tablet    Sig: Take 1 tablet by mouth at bedtime.    Dispense:  3 Package    Refill:  0      F/U  Return in about 3 months (around 02/20/2019). I spent 27 minutes involved in the care of this patient of which greater than 50% was spent discussing ovulation induction using letrozole, PCO and infertility, need for semen analysis, metformin restart, rationale for repeating OCPs.  All questions answered.  Finis Bud, M.D. 11/20/2018 9:32 AM

## 2018-11-20 NOTE — Progress Notes (Signed)
Patient comes in today to discuss medication to help with ovulation.

## 2019-01-06 ENCOUNTER — Encounter: Payer: Managed Care, Other (non HMO) | Admitting: Obstetrics and Gynecology

## 2019-01-06 ENCOUNTER — Other Ambulatory Visit: Payer: Self-pay

## 2019-01-13 ENCOUNTER — Other Ambulatory Visit: Payer: Self-pay | Admitting: Surgical

## 2019-01-13 MED ORDER — METRONIDAZOLE 500 MG PO TABS: 500.0000 mg | ORAL_TABLET | Freq: Two times a day (BID) | ORAL | 0 refills | Status: DC

## 2019-01-22 ENCOUNTER — Encounter: Payer: Self-pay | Admitting: Family Medicine

## 2019-02-11 ENCOUNTER — Encounter: Payer: Self-pay | Admitting: Family Medicine

## 2019-02-11 ENCOUNTER — Ambulatory Visit (INDEPENDENT_AMBULATORY_CARE_PROVIDER_SITE_OTHER): Payer: Managed Care, Other (non HMO) | Admitting: Family Medicine

## 2019-02-11 ENCOUNTER — Other Ambulatory Visit: Payer: Self-pay

## 2019-02-11 DIAGNOSIS — F331 Major depressive disorder, recurrent, moderate: Secondary | ICD-10-CM | POA: Diagnosis not present

## 2019-02-11 DIAGNOSIS — F411 Generalized anxiety disorder: Secondary | ICD-10-CM | POA: Diagnosis not present

## 2019-02-11 NOTE — Patient Instructions (Signed)
Here are some resources to help you if you feel you are in a mental health crisis:  National Suicide Prevention Lifeline - Call 1-800-273-8255  for help - Website with more resources: https://suicidepreventionlifeline.org/  [facility] - Call 866-275-9552 for help. - Mobile Crisis Program available 24 hours a day, 365 days a year. - Available for anyone of any age in [place].  [facility] - Address: [address], [place] - Telephone: [phone]  - Hours of Operation: Sunday - Saturday - 8:00 a.m. - 8:00 p.m. - Medicaid, Medicare (Government Issued Only), BCBS, and Cash - Pay - Crisis Management, Outpatient Individual & Group Therapy, Psychiatrists on-site to provide medication management, In-Home Psychiatric Care, and Peer Support Care.  National Mobile Crisis: 1-800-939-5911 - Mobile Crisis Program available 24 hours a day, 365 days a year. - Available for anyone of any age in [place]    

## 2019-02-11 NOTE — Progress Notes (Signed)
Name: Katherine Huynh   MRN: [id number]    DOB: 1995-05-19   Date:02/11/2019       Progress Note  Subjective  Chief Complaint  Chief Complaint  Patient presents with  . Referral     Psychiatrist Referral for depression,anixety, and bipolar disorder    I connected with  [name]  on 02/11/19 at  7:20 AM EDT by a video enabled telemedicine application and verified that I am speaking with the correct person using two identifiers.  I discussed the limitations of evaluation and management by telemedicine and the availability of in person appointments. The patient expressed understanding and agreed to proceed. Staff also discussed with the patient that there may be a patient responsible charge related to this service. Patient Location: Home Provider Location: home Additional Individuals present: Office  HPI  Pt presents with concern for depression, BPD, and anxiety.  Her PHQ-9 Score is very high as is her GAD7 score today. She had a psychiatrist (Dr. Elna Breslow), but she has not been able to get in touch with them and would like a referral to a new psychiatrist.  She is not on any medication at all - has been off for about 6 months.  She was on Rexulti and Zoloft along with PRN hydroxyzine.  She does report on her PHQ-9 scoring that she has SI (score of 3).  She states SI is not every day, but it is often. She does use Mobile Crisis once every couple of weeks if she is feeling SI.  She denies any plan for self harm or SI today.  She does verbally contract for safety until our next visit - will call mobile crisis or suicide hotline if she does not feel that she can maintain her own safety.    Patient Active Problem List   Diagnosis Date Noted  . Severe dysmenorrhea 05/30/2018  . Moderate episode of recurrent major depressive disorder (HCC) 05/30/2018  . Murmur, cardiac 10/12/2016  . Polycystic disease, ovaries 10/02/2016  . 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 Morbid obesity (HCC)  02/17/2016  . Difficulty controlling anger 02/17/2016  . Binge eating disorder 02/17/2016  . Athlete's foot, left 10/10/2015  . Personal history of sulfonamide allergy 09/15/2015  . Onychomycosis 09/15/2015  . Fam hx-ischem heart disease 09/15/2015    Past Surgical History:  Procedure Laterality Date  . NO PAST SURGERIES      Family History  Problem Relation Age of Onset  . Healthy Mother   . Healthy Father   . Cancer Maternal Grandmother        breast  . Heart disease Maternal Grandfather   . Heart disease Paternal Grandfather   . Alcohol abuse Sister   . ADD / ADHD Sister     Social History   Socioeconomic History  . Marital status: Significant Other    Spouse name: [name]   . Number of children: 0  . Years of education: Not on file  . Highest education level: High school graduate  Occupational History  . Occupation: Music therapist  . Financial resource strain: Not hard at all  . Food insecurity    Worry: Never true    Inability: Never true  . Transportation needs    Medical: No    Non-medical: No  Tobacco Use  . Smoking status: Never Smoker  . Smokeless tobacco: Never Used  Substance and Sexual Activity  . Alcohol use: Yes    Alcohol/week: 6.0 standard drinks  Types: 3 Glasses of wine, 2 Cans of beer, 1 Shots of liquor per week    Comment: occas  . Drug use: Not Currently    Types: Marijuana    Comment: last used about a month ago  . Sexual activity: Yes    Birth control/protection: None  Lifestyle  . Physical activity    Days per week: 0 days    Minutes per session: 0 min  . Stress: Very much  Relationships  . Social Herbalist on phone: Not on file    Gets together: Not on file    Attends religious service: Never    Active member of club or organization: No    Attends meetings of clubs or organizations: Never    Relationship status: Never married  . Intimate partner violence    Fear of current or ex partner: No     Emotionally abused: No    Physically abused: No    Forced sexual activity: No  Other Topics Concern  . Not on file  Social History Narrative   Lives with fiancee, they live together, works full time.      Current Outpatient Medications:  .  ibuprofen (ADVIL,MOTRIN) 800 MG tablet, Take 1 tablet (800 mg total) by mouth every 8 (eight) hours as needed., Disp: 90 tablet, Rfl: 0 .  desogestrel-ethinyl estradiol (MIRCETTE) 0.15-0.02/0.01 MG (21/5) tablet, Take 1 tablet by mouth at bedtime. (Patient not taking: Reported on 02/11/2019), Disp: 3 Package, Rfl: 0 .  metFORMIN (GLUCOPHAGE) 500 MG tablet, Take 1 tablet (500 mg total) by mouth 2 (two) times daily with a meal. (Patient not taking: Reported on 02/11/2019), Disp: 180 tablet, Rfl: 1 .  metroNIDAZOLE (FLAGYL) 500 MG tablet, Take 1 tablet (500 mg total) by mouth 2 (two) times daily. (Patient not taking: Reported on 02/11/2019), Disp: 14 tablet, Rfl: 0 .  propranolol (INDERAL) 10 MG tablet, Take 1 tablet (10 mg total) by mouth 3 (three) times daily as needed. For severe anxiety sx (Patient not taking: Reported on 11/20/2018), Disp: 270 tablet, Rfl: 0  Allergies  Allergen Reactions  . Cinnamon Itching  . Erythromycin Other (See Comments)    Unknown reaction, happened in childhood  . Sulfa Antibiotics Rash    I personally reviewed active problem list, medication list, allergies, notes from last encounter, lab results with the patient/caregiver today.   ROS  Ten systems reviewed and is negative except as mentioned in HPI  Objective  Virtual encounter, vitals not obtained.  There is no height or weight on file to calculate BMI.  Physical Exam  Constitutional: Patient appears well-developed and well-nourished. No distress.  HENT: Head: Normocephalic and atraumatic.  Neck: Normal range of motion. Pulmonary/Chest: Effort normal. No respiratory distress. Speaking in complete sentences Neurological: Pt is alert and oriented to person,  place, and time. Coordination, speech are normal.  Psychiatric: Patient has a normal mood and affect. behavior is normal. Judgment and thought content normal.   No results found for this or any previous visit (from the past 72 hour(s)).  PHQ2/9: Depression screen [facility] 2/9 02/11/2019 07/18/2018 05/30/2018 02/27/2018 12/04/2017  Decreased Interest 3 2 2 3 1   Down, Depressed, Hopeless 3 1 2 3 3   PHQ - 2 Score 6 3 4 6 4   Altered sleeping 3 3 3 2 3   Tired, decreased energy 3 1 3 2 3   Change in appetite 3 3 3 3 1   Feeling bad or failure about yourself  3 1 2  2  1  Trouble concentrating 3 1 3 1 2   Moving slowly or fidgety/restless 1 0 1 0 0  Suicidal thoughts 3 0 1 0 0  PHQ-9 Score 25 12 20 16 14   Difficult doing work/chores Extremely dIfficult Somewhat difficult Extremely dIfficult Extremely dIfficult Not difficult at all  Some recent data might be hidden   PHQ-2/9 Result is positive.    Fall Risk: Fall Risk  02/11/2019 07/18/2018 05/30/2018 02/27/2018 12/04/2017  Falls in the past year? 0 0 0 No No  Number falls in past yr: 0 0 - - -  Injury with Fall? 0 0 - - -    Assessment & Plan  1. MDD (major depressive disorder), recurrent episode, moderate (HCC) - Ambulatory referral to Psychiatry  2. GAD (generalized anxiety disorder) - Ambulatory referral to Psychiatry  She does verbally contract for safety as per the HPI.  Discussed [facility] and recommended she go there today to get evaluation started; I also placed urgent referral to psychiatry.  We will touch base in 2 weeks as well if psychiatry referral has not gone through yet.  No medication start at this time as she mentions BPD diagnosis as well, and this should really be managed by psychiatry, especially with the severity of symptoms she is experiencing.  I discussed the assessment and treatment plan with the patient. The patient was provided an opportunity to ask questions and all were answered. The patient agreed with the plan and demonstrated  an understanding of the instructions.  The patient was advised to call back or seek an in-person evaluation if the symptoms worsen or if the condition fails to improve as anticipated.  I provided 14 minutes of non-face-to-face time during this encounter.

## 2019-02-20 ENCOUNTER — Other Ambulatory Visit: Payer: Self-pay

## 2019-02-20 ENCOUNTER — Ambulatory Visit: Payer: Managed Care, Other (non HMO) | Admitting: Obstetrics and Gynecology

## 2019-02-20 ENCOUNTER — Encounter: Payer: Self-pay | Admitting: Obstetrics and Gynecology

## 2019-02-20 VITALS — BP 129/83 | HR 80 | Ht 63.0 in | Wt 329.1 lb

## 2019-02-20 DIAGNOSIS — Z6841 Body Mass Index (BMI) 40.0 and over, adult: Secondary | ICD-10-CM | POA: Diagnosis not present

## 2019-02-20 DIAGNOSIS — E8881 Metabolic syndrome: Secondary | ICD-10-CM | POA: Diagnosis not present

## 2019-02-20 DIAGNOSIS — E282 Polycystic ovarian syndrome: Secondary | ICD-10-CM | POA: Diagnosis not present

## 2019-02-20 NOTE — Progress Notes (Signed)
Patient comes in today for 3 month follow up.  

## 2019-02-20 NOTE — Progress Notes (Signed)
HPI:      [name] is a 23 y.o. G2P0 who LMP was Patient's last menstrual period was 02/18/2019.  Subjective:   She presents today stating that she has now changed her mind about birth control and because her college is virtual she is considering pregnancy again instead of continued birth control.  She is currently taking OCPs but is not taking Metformin as directed.  Her partner has not had his semen analysis. She says that she has previously had a work-up with another doctor in this practice and used Metformin and it "did not work".  She states that he did not center to infertility and that she could never get a referral. After discussing this with her in some detail she is refusing Metformin and refusing semen analysis.  She is specifically requesting only use of something like Clomid.    Hx: The following portions of the patient's history were reviewed and updated as appropriate:             She  has a past medical history of Depression, Fam hx-ischem heart disease (09/15/2015), Heart murmur, Morbid obesity ([place]) (02/17/2016), and Polycystic disease, ovaries. She does not have any pertinent problems on file. She  has a past surgical history that includes No past surgeries. Her family history includes ADD / ADHD in her sister; Alcohol abuse in her sister; Cancer in her maternal grandmother; Healthy in her father and mother; Heart disease in her maternal grandfather and paternal grandfather. She  reports that she has never smoked. She has never used smokeless tobacco. She reports current alcohol use of about 6.0 standard drinks of alcohol per week. She reports previous drug use. Drug: Marijuana. She has a current medication list which includes the following prescription(s): ibuprofen. She is allergic to cinnamon; erythromycin; and sulfa antibiotics.       Review of Systems:  Review of Systems  Constitutional: Denied constitutional symptoms, night sweats, recent illness, fatigue, fever,  insomnia and weight loss.  Eyes: Denied eye symptoms, eye pain, photophobia, vision change and visual disturbance.  Ears/Nose/Throat/Neck: Denied ear, nose, throat or neck symptoms, hearing loss, nasal discharge, sinus congestion and sore throat.  Cardiovascular: Denied cardiovascular symptoms, arrhythmia, chest pain/pressure, edema, exercise intolerance, orthopnea and palpitations.  Respiratory: Denied pulmonary symptoms, asthma, pleuritic pain, productive sputum, cough, dyspnea and wheezing.  Gastrointestinal: Denied, gastro-esophageal reflux, melena, nausea and vomiting.  Genitourinary: Denied genitourinary symptoms including symptomatic vaginal discharge, pelvic relaxation issues, and urinary complaints.  Musculoskeletal: Denied musculoskeletal symptoms, stiffness, swelling, muscle weakness and myalgia.  Dermatologic: Denied dermatology symptoms, rash and scar.  Neurologic: Denied neurology symptoms, dizziness, headache, neck pain and syncope.  Psychiatric: Denied psychiatric symptoms, anxiety and depression.  Endocrine: Denied endocrine symptoms including hot flashes and night sweats.   Meds:   Current Outpatient Medications on File Prior to Visit  Medication Sig Dispense Refill  . ibuprofen (ADVIL,MOTRIN) 800 MG tablet Take 1 tablet (800 mg total) by mouth every 8 (eight) hours as needed. 90 tablet 0   No current facility-administered medications on file prior to visit.     Objective:     Vitals:   02/20/19 0944  BP: 129/83  Pulse: 80                Assessment:    G2P0 Patient Active Problem List   Diagnosis Date Noted  . Severe dysmenorrhea 05/30/2018  . Moderate episode of recurrent major depressive disorder ([place]) 05/30/2018  . Murmur, cardiac 10/12/2016  . Polycystic disease,  ovaries 10/02/2016  . 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 Morbid obesity ([facility]) 02/17/2016  . Difficulty controlling anger 02/17/2016  . Binge eating disorder 02/17/2016  .  Athlete's foot, left 10/10/2015  . Personal history of sulfonamide allergy 09/15/2015  . Onychomycosis 09/15/2015  . Fam hx-ischem heart disease 09/15/2015     1. PCO (polycystic ovaries)   2. BMI 50.0-59.9, adult (HCC)   3. Insulin resistance     Patient refusing to use medications as directed.  Refusing semen analysis.   Plan:            1.  Because she has been noncompliant with her medications and is not willing to start them, because her partner is refusing semen analysis and Clomid is not risk-free I have declined this treatment.  2.  I explained to her that I will be happy to refer her to an infertility specialist that can "take this to the next level".  Perhaps they will be willing to compromise care and skip several steps of infertility work-up and treatment. Orders No orders of the defined types were placed in this encounter.   No orders of the defined types were placed in this encounter.     F/U  No follow-ups on file. I spent 18 minutes involved in the care of this patient of which greater than 50% was spent discussing patient's change of mind for birth control versus fertility, medications used to treat PCO and insulin resistance, possible referral for continued infertility.  Elonda Husky, M.D. 02/20/2019 10:05 AM

## 2019-03-11 ENCOUNTER — Ambulatory Visit: Payer: Managed Care, Other (non HMO) | Admitting: Family Medicine

## 2019-04-14 ENCOUNTER — Other Ambulatory Visit: Payer: Self-pay | Admitting: Psychiatry

## 2019-04-14 DIAGNOSIS — F410 Panic disorder [episodic paroxysmal anxiety] without agoraphobia: Secondary | ICD-10-CM

## 2019-04-16 ENCOUNTER — Encounter: Payer: Self-pay | Admitting: Family Medicine

## 2019-04-16 DIAGNOSIS — F411 Generalized anxiety disorder: Secondary | ICD-10-CM

## 2019-04-16 MED ORDER — PROPRANOLOL HCL 10 MG PO TABS: ORAL_TABLET | ORAL | 0 refills | Status: DC

## 2019-04-28 ENCOUNTER — Ambulatory Visit: Payer: Managed Care, Other (non HMO) | Admitting: Obstetrics and Gynecology

## 2019-04-28 ENCOUNTER — Encounter: Payer: Self-pay | Admitting: Obstetrics and Gynecology

## 2019-04-28 ENCOUNTER — Other Ambulatory Visit: Payer: Self-pay

## 2019-04-28 VITALS — BP 136/88 | HR 94 | Ht 63.0 in | Wt 334.8 lb

## 2019-04-28 DIAGNOSIS — N926 Irregular menstruation, unspecified: Secondary | ICD-10-CM | POA: Diagnosis not present

## 2019-04-28 DIAGNOSIS — Z6841 Body Mass Index (BMI) 40.0 and over, adult: Secondary | ICD-10-CM

## 2019-04-28 DIAGNOSIS — Z8742 Personal history of other diseases of the female genital tract: Secondary | ICD-10-CM

## 2019-04-28 DIAGNOSIS — E282 Polycystic ovarian syndrome: Secondary | ICD-10-CM

## 2019-04-28 LAB — POCT URINE PREGNANCY: Preg Test, Ur: NEGATIVE

## 2019-04-28 MED ORDER — METFORMIN HCL 500 MG PO TABS: ORAL_TABLET | ORAL | 5 refills | Status: DC

## 2019-04-28 MED ORDER — CLOMIPHENE CITRATE 50 MG PO TABS: 50.0000 mg | ORAL_TABLET | Freq: Every day | ORAL | 3 refills | Status: DC

## 2019-04-28 MED ORDER — MEDROXYPROGESTERONE ACETATE 10 MG PO TABS: 10.0000 mg | ORAL_TABLET | Freq: Every day | ORAL | 2 refills | Status: DC

## 2019-04-28 NOTE — Progress Notes (Signed)
Pt is present to discuss birth control options. Pt is unsure of what form of birth control is would like. Pt is currently sexually active and is not using any form of birth control. LMP 02/19/19. UPT negative.

## 2019-04-28 NOTE — Progress Notes (Signed)
GYNECOLOGY PROGRESS NOTE  Subjective:    Patient ID: Katherine Huynh, female    DOB: 1995/11/04, 23 y.o.   MRN: [id number]  HPI  Patient is a 23 y.o. G60P0 female who initially presented for management of birth control however on further discussion with patient she actually reports that she desires to become pregnant and has desired this for at least 1 year.  Patient notes that she was initially seen due to irregular cycles.  Was started on birth control in order to regulate her cycles and was told that she would only need to be on the medication for 3 months, however when she returned she notes that she was told to discontinue the birth control since it regulated her periods.  Patient states that she has seen several providers since then, who all continue to place her on birth control however patient notes that her ultimate desire is to become pregnant.  She has been diagnosed with PCOS based on ultrasound findings.  Patient also does have some mild insulin resistance.   She reports that her last menstrual period was in October.  States that she ran out of birth control pills aroundthat time.  The following portions of the patient's history were reviewed and updated as appropriate: allergies, current medications, past family history, past medical history, past social history, past surgical history and problem list.  Review of Systems Pertinent items noted in HPI and remainder of comprehensive ROS otherwise negative.   Objective:   Blood pressure 136/88, pulse 94, height 5\' 3"  (1.6 m), weight (!) 334 lb 12.8 oz (151.9 kg), last menstrual period 02/19/2019. Body mass index is 59.31 kg/m.  General appearance: alert and no distress Remainder of exam deferred.    Labs:  Results for Katherine, Huynh (MRN [id number]) as of 04/29/2019 20:56  Ref. Range 10/21/2018 14:01  DHEA-SO4 Latest Ref Range: 110.0 - 431.7 ug/dL 303.1  LH Latest Units: mIU/mL 6.7  FSH Latest Units: mIU/mL 6.1  Prolactin Latest  Ref Range: 4.8 - 23.3 ng/mL 10.4  Glucose, Plasma Latest Ref Range: 65 - 99 mg/dL 86  Sex Horm Binding Glob, Serum Latest Ref Range: 24.6 - 122.0 nmol/L 61.6  Testosterone Latest Ref Range: 8 - 48 ng/dL 42  Testosterone Free Latest Ref Range: 0.0 - 4.2 pg/mL 2.5  INSULIN Latest Ref Range: 2.6 - 24.9 uIU/mL 47.9 (H)  TSH Latest Ref Range: 0.450 - 4.500 uIU/mL 2.370    Results for orders placed or performed in visit on 04/28/19  POCT urine pregnancy  Result Value Ref Range   Preg Test, Ur Negative Negative     Imaging:  US PELVIS (TRANSABDOMINAL ONLY) Patient Name: Katherine Huynh DOB: Jan 11, 1996 MRN: [id number]  ULTRASOUND REPORT  Location: [facility]  Date of Service: 05/22/2018   Indications:PCOS Findings:  The uterus is anteverted and measures 8.5x3.8x4.8cm. Echo texture is homogenous without evidence of focal masses. The Endometrium measures 13.2 mm.  Right Ovary measures 7.4 cm3. It has some evidences of PCOS. A simple cyst  = 2x2 cm is seen medial to the Rt ovary (possibility of para-ovarian cyst  vs others) Left Ovary measures 5.6 cm3. It It has some evidences of PCOS.  There is no free fluid in the cul de sac.  Impression: 1.Right Ovary measures 7.4 cm3. It has some evidences of PCOS.  2. A simple cyst = 2x2 cm is seen medial to the Rt ovary (possibility of  para-ovarian cyst vs others) 3. Left Ovary measures 5.6 cm3. It It has  some evidences of PCOS.  Recommendations: 1.Clinical correlation with the patient's History and Physical Exam. 2.  Consider laboratory correlation for PCO  Abeer Alsammarraie,RDMS  The ultrasound images and findings were reviewed by me and I agree with  the above report.  Elonda Husky, M.D. 05/22/2018 5:48 PM   Assessment:   PCOS Irregular cycles Morbid obesity H/o ovarian cyst  Plan:   PCOS with irregular cycles- Lengthy discussion had with patient regarding t the diagnosis of PCOS and her history of irregular cycles.   Patient's menses did respond to combined OCPs, as she began to have regular cycles after this.  I discussed that there are alternative methods for being able to induce her cycles and have her to continue to be regular so that she could attempt to try on her own.  Discussed that patients with PCOS can have more of a difficult time becoming pregnant and sometimes may require fertility assistance with medications or even reproductive technology. Discussed use of Clomid if needed. Patient notes she would like to use. Will prescribe Provera now for irregular cycles. UPT negative today.   - Morbid obesity - I also have a discussion with her regarding her obesity as a risk factor for infertility as well as the possibility of conception with subsequent miscarriage.  I have reviewed patient's dietary habits, notes she eats 3 times a day and does not really snack, but could make better food choices.  Also notes that she has already started taking up walking.  Encouraged that losing at least 5% of current weight can lead to better outcomes and increase pregnancy chances.  - Increased insulin levels possibly signifying insulin resistance.  Discussed that as she currently has PCOS and desires to become pregnant, she should work to optimize her health. Discussed use of Metformin.  Patient ok to use.  - patient concerned about ovarian cyst that was noted on last ultrasound. Discussed that cyst was small, simple appearing.  Likely has resolved on its own.     RTC in 3 months for annual exam and can f/u fertility issues then.    A total of 25 minutes were spent face-to-face with the patient during this encounter and over half of that time dealt with counseling and coordination of care.   Hildred Laser, MD [facility]

## 2019-04-28 NOTE — Patient Instructions (Addendum)
Polycystic Ovarian Syndrome  Polycystic ovarian syndrome (PCOS) is a common hormonal disorder among women of reproductive age. In most women with PCOS, many small fluid-filled sacs (cysts) grow on the ovaries, and the cysts are not part of a normal menstrual cycle. PCOS can cause problems with your menstrual periods and make it difficult to get pregnant. It can also cause an increased risk of miscarriage with pregnancy. If it is not treated, PCOS can lead to serious health problems, such as diabetes and heart disease. What are the causes? The cause of PCOS is not known, but it may be the result of a combination of certain factors, such as:  Irregular menstrual cycle.  High levels of certain hormones (androgens).  Problems with the hormone that helps to control blood sugar (insulin resistance).  Certain genes. What increases the risk? This condition is more likely to develop in women who have a family history of PCOS. What are the signs or symptoms? Symptoms of PCOS may include:  Multiple ovarian cysts.  Infrequent periods or no periods.  Periods that are too frequent or too heavy.  Unpredictable periods.  Inability to get pregnant (infertility) because of not ovulating.  Increased growth of hair on the face, chest, stomach, back, thumbs, thighs, or toes.  Acne or oily skin. Acne may develop during adulthood, and it may not respond to treatment.  Pelvic pain.  Weight gain or obesity.  Patches of thickened and dark [place] or black skin on the neck, arms, breasts, or thighs (acanthosis nigricans).  Excess hair growth on the face, chest, abdomen, or upper thighs (hirsutism). How is this diagnosed? This condition is diagnosed based on:  Your medical history.  A physical exam, including a pelvic exam. Your health care provider may look for areas of increased hair growth on your skin.  Tests, such as: ? Ultrasound. This may be used to examine the ovaries and the lining of the  uterus (endometrium) for cysts. ? Blood tests. These may be used to check levels of sugar (glucose), female hormone (testosterone), and female hormones (estrogen and progesterone) in your blood. How is this treated? There is no cure for PCOS, but treatment can help to manage symptoms and prevent more health problems from developing. Treatment varies depending on:  Your symptoms.  Whether you want to have a baby or whether you need birth control (contraception). Treatment may include nutrition and lifestyle changes along with:  Progesterone hormone to start a menstrual period.  Birth control pills to help you have regular menstrual periods.  Medicines to make you ovulate, if you want to get pregnant.  Medicine to reduce excessive hair growth.  Surgery, in severe cases. This may involve making small holes in one or both of your ovaries. This decreases the amount of testosterone that your body produces. Follow these instructions at home:  Take over-the-counter and prescription medicines only as told by your health care provider.  Follow a healthy meal plan. This can help you reduce the effects of PCOS. ? Eat a healthy diet that includes lean proteins, complex carbohydrates, fresh fruits and vegetables, low-fat dairy products, and healthy fats. Make sure to eat enough fiber.  If you are overweight, lose weight as told by your health care provider. ? Losing 10% of your body weight may improve symptoms. ? Your health care provider can determine how much weight loss is best for you and can help you lose weight safely.  Keep all follow-up visits as told by your health care provider.   This is important. Contact a health care provider if:  Your symptoms do not get better with medicine.  You develop new symptoms. This information is not intended to replace advice given to you by your health care provider. Make sure you discuss any questions you have with your health care provider. Document  Released: 08/24/2004 Document Revised: 04/12/2017 Document Reviewed: 10/16/2015 Elsevier Patient Education  2020 Elsevier Inc[employer]   Diet for Polycystic Ovary Syndrome Polycystic ovary syndrome (PCOS) is a disorder of the chemicals (hormones) that regulate a woman's reproductive system, including monthly periods (menstruation). The condition causes important hormones to be out of balance. PCOS can:  Stop your periods or make them irregular.  Cause cysts to develop on your ovaries.  Make it difficult to get pregnant.  Stop your body from responding to the effects of insulin (insulin resistance). Insulin resistance can lead to obesity and diabetes. Changing what you eat can help you manage PCOS and improve your health. Following a balanced diet can help you lose weight and improve the way that your body uses insulin. What are tips for following this plan?  Follow a balanced diet for meals and snacks. Eat breakfast, lunch, dinner, and one or two snacks every day.  Include protein in each meal and snack.  Choose whole grains instead of products that are made with refined flour.  Eat a variety of foods.  Exercise regularly as told by your health care provider. Aim to do 30 or more minutes of exercise on most days of the week.  If you are overweight or obese: ? Pay attention to how many calories you eat. Cutting down on calories can help you lose weight. ? Work with your health care provider or a diet and nutrition specialist (dietitian) to figure out how many calories you need each day. What foods can I eat?  Fruits Include a variety of colors and types. All fruits are helpful for PCOS. Vegetables Include a variety of colors and types. All vegetables are helpful for PCOS. Grains Whole grains, such as whole wheat. Whole-grain breads, crackers, cereals, and pasta. Unsweetened oatmeal, bulgur, barley, quinoa, and [place] rice. Tortillas made from corn or whole-wheat flour. Meats and other  proteins Low-fat (lean) proteins, such as fish, chicken, beans, eggs, and tofu. Dairy Low-fat dairy products, such as skim milk, cheese sticks, and yogurt. Beverages Low-fat or fat-free drinks, such as water, low-fat milk, sugar-free drinks, and small amounts of 100% fruit juice. Seasonings and condiments Ketchup. Mustard. Barbecue sauce. Relish. Low-fat or fat-free mayonnaise. Fats and oils Olive oil or canola oil. Walnuts and almonds. The items listed above may not be a complete list of recommended foods and beverages. Contact a dietitian for more options. What foods are not recommended? Foods that are high in calories or fat. Fried foods. Sweets. Products that are made from refined white flour, including white bread, pastries, white rice, and pasta. The items listed above may not be a complete list of foods and beverages to avoid. Contact a dietitian for more information. Summary  PCOS is a hormonal imbalance that affects a woman's reproductive system.  You can help to manage your PCOS by exercising regularly and eating a healthy, varied diet of vegetables, fruit, whole grains, low-fat (lean) protein, and low-fat dairy products.  Changing what you eat can improve the way that your body uses insulin, help your hormones reach normal levels, and help you lose weight. This information is not intended to replace advice given to you by your health   care provider. Make sure you discuss any questions you have with your health care provider. Document Released: 08/22/2015 Document Revised: 08/20/2018 Document Reviewed: 03/04/2017 Elsevier Patient Education  [place] USE IT? Clomid helps your ovaries to release eggs (ovulate).  HOW TO USE IT? Clomid is taken as a pill usually on days 5,6,7,8, & 9 of your cycle.  Day 1 is the first day of your period. The dose or duration may be changed to achieve ovulation.  Provera (progesterone) may first be used  to bring on a period for some patients.  If you do not get pregnant this cycle, for your next cycles, take on days 1, 2, 3, 4 and 5.  If you do not get a period, take Provera 10 mg daily for 10 days to bring on a period; the first day you get bleeding is Day 1 of your cycle. The day of ovulation on Clomid is usually between cycle day 14 and 17.  Having sexual intercourse at least every other day between cycle day 13 and 18 will improve your chances of becoming pregnant during the Clomid cycle.  You may monitor your ovulation using basal body temperature charts or with ovulation kits.  If using the ovulation predictor kits, having intercourse the day of the surge and the two days following is recommended. If you get your period, call when it starts for an appointment with your doctor, so that an exam may be done, and another Clomid cycle can be considered if appropriate. If you do not get a period by day 35 of the cycle, please get a blood pregnancy test.  If it is negative, speak to your doctor for instructions to bring on another period and to plan a follow-up appointment.  THINGS TO KNOW: If you get pregnant while using Clomid, your chance of twins is 7% and triplets is less than 1%. Some studies have suggested the use of "fertility drugs" may increase your risk of ovarian cancers in the future.  It is unclear if these drugs increase the risk, or people who have problems with fertility are prone for these cancers.  If there is an actual risk, it is very low.  If you have a history of liver problems or ovarian cancer, it may be wise to avoid this medication.  SIDE EFFECTS:  The most common side effect is hot flashes (20%).  Breast tenderness, headaches, nausea, bloating may also occur at different times.  Less than 3/1,000 people have dryness or loss of hair.  Persistent ovarian cysts may form from the use of this medication.  Ovarian hyperstimulation syndrome is a rare side effect at low  doses.  Visual changes like flashes of light or blurring.

## 2019-04-29 ENCOUNTER — Encounter: Payer: Self-pay | Admitting: Obstetrics and Gynecology

## 2019-07-16 ENCOUNTER — Encounter: Payer: Self-pay | Admitting: Family Medicine

## 2019-07-17 ENCOUNTER — Ambulatory Visit (INDEPENDENT_AMBULATORY_CARE_PROVIDER_SITE_OTHER)
Admission: RE | Admit: 2019-07-17 | Discharge: 2019-07-17 | Disposition: A | Discharge status: Home / Self Care | Payer: Managed Care, Other (non HMO) | Source: Ambulatory Visit

## 2019-07-17 DIAGNOSIS — R300 Dysuria: Secondary | ICD-10-CM

## 2019-07-17 MED ORDER — CEPHALEXIN 500 MG PO CAPS: 500.0000 mg | ORAL_CAPSULE | Freq: Two times a day (BID) | ORAL | 0 refills | Status: AC

## 2019-07-17 NOTE — ED Provider Notes (Signed)
Virtual Visit via Video Note:  Katherine Huynh  initiated request for Telemedicine visit with [facility] team. I connected with Katherine Huynh  on 07/17/2019 at 11:36 AM  for a synchronized telemedicine visit using a video enabled HIPPA compliant telemedicine application. I verified that I am speaking with Katherine Huynh  using two identifiers. Sharion Balloon, NP  was physically located in a [facility] Urgent care site and Maronda Caison was located at a different location.   The limitations of evaluation and management by telemedicine as well as the availability of in-person appointments were discussed. Patient was informed that she  may incur a bill ( including co-pay) for this virtual visit encounter. Katherine Huynh  expressed understanding and gave verbal consent to proceed with virtual visit.     History of Present Illness:Katherine Huynh  is a 24 y.o. female presents for evaluation of urinary frequency and dysuria since yesterday.  She thinks she may have some faint hematuria this morning.  She denies fever, chills, abdominal pain, back pain, vaginal discharge, pelvic pain, or other symptoms.  Treatment attempted at home with Azo with moderate relief.  She denies pregnancy or breastfeeding.     Allergies  Allergen Reactions  . Cinnamon Itching  . Erythromycin Other (See Comments)    Unknown reaction, happened in childhood  . Sulfa Antibiotics Rash     Past Medical History:  Diagnosis Date  . Depression   . Fam hx-ischem heart disease 09/15/2015  . Heart murmur   . Morbid obesity ([place]) 02/17/2016  . Polycystic disease, ovaries      Social History   Tobacco Use  . Smoking status: Former Smoker    Types: Cigarettes    Quit date: 09/28/2016    Years since quitting: 2.8  . Smokeless tobacco: Never Used  Substance Use Topics  . Alcohol use: Yes    Alcohol/week: 6.0 standard drinks    Types: 3 Glasses of wine, 2 Cans of beer, 1 Shots of liquor per week    Comment: occas  . Drug use:  Not Currently    Types: Marijuana    Comment: last used about a month ago    ROS: as stated in HPI.  All other systems reviewed and negative.      Observations/Objective: Physical Exam  VITALS: Patient denies fever. GENERAL: Alert, appears well and in no acute distress. HEENT: Atraumatic. NECK: Normal movements of the head and neck. CARDIOPULMONARY: No increased WOB. Speaking in clear sentences. I:E ratio WNL.  MS: Moves all visible extremities without noticeable abnormality. PSYCH: Pleasant and cooperative, well-groomed. Speech normal rate and rhythm. Affect is appropriate. Insight and judgement are appropriate. Attention is focused, linear, and appropriate.  NEURO: CN grossly intact. Oriented as arrived to appointment on time with no prompting. Moves both UE equally.  SKIN: No obvious lesions, wounds, erythema, or cyanosis noted on face or hands.   Assessment and Plan:    ICD-10-CM   1. Dysuria  R30.0        Follow Up Instructions: Treating with Keflex.  Instructed patient to follow-up with her PCP or come here to be seen in person if her symptoms are not improving.  Patient agrees to plan of care.      I discussed the assessment and treatment plan with the patient. The patient was provided an opportunity to ask questions and all were answered. The patient agreed with the plan and demonstrated an understanding of the instructions.   The patient was advised  to call back or seek an in-person evaluation if the symptoms worsen or if the condition fails to improve as anticipated.      Mickie Bail, NP  07/17/2019 11:36 AM         Mickie Bail, NP 07/17/19 1136

## 2019-07-17 NOTE — Discharge Instructions (Addendum)
Take the antibiotic as directed.    Follow up with your primary care provider or come here to be seen in person if your symptoms are not improving.    

## 2019-07-26 ENCOUNTER — Encounter: Payer: Self-pay | Admitting: Family Medicine

## 2019-07-27 ENCOUNTER — Other Ambulatory Visit: Payer: Self-pay

## 2019-07-28 ENCOUNTER — Encounter: Payer: Managed Care, Other (non HMO) | Admitting: Obstetrics and Gynecology

## 2019-08-11 ENCOUNTER — Encounter: Payer: Managed Care, Other (non HMO) | Admitting: Obstetrics and Gynecology

## 2019-08-26 NOTE — Progress Notes (Deleted)
Pt present for annual exam.  

## 2019-08-27 ENCOUNTER — Encounter: Payer: Managed Care, Other (non HMO) | Admitting: Obstetrics and Gynecology

## 2019-09-01 ENCOUNTER — Encounter: Payer: Managed Care, Other (non HMO) | Admitting: Obstetrics and Gynecology

## 2019-09-01 ENCOUNTER — Encounter: Payer: Self-pay | Admitting: Obstetrics and Gynecology

## 2019-09-01 ENCOUNTER — Other Ambulatory Visit: Payer: Self-pay

## 2019-09-01 NOTE — Progress Notes (Signed)
Pt present follow up for clomid. Pt stated that she was doing well no problems.

## 2019-09-01 NOTE — Progress Notes (Signed)
Patient left prior to being seen by provider due to having another appointment and provider being called away to the hospital urgently. Patient opted to reschedule appointment.

## 2019-09-11 ENCOUNTER — Ambulatory Visit: Payer: Managed Care, Other (non HMO) | Admitting: Obstetrics and Gynecology

## 2019-09-11 ENCOUNTER — Encounter: Payer: Self-pay | Admitting: Obstetrics and Gynecology

## 2019-09-11 ENCOUNTER — Other Ambulatory Visit: Payer: Self-pay

## 2019-09-11 VITALS — BP 135/83 | HR 93 | Ht 63.0 in | Wt 337.8 lb

## 2019-09-11 DIAGNOSIS — N926 Irregular menstruation, unspecified: Secondary | ICD-10-CM

## 2019-09-11 DIAGNOSIS — E282 Polycystic ovarian syndrome: Secondary | ICD-10-CM

## 2019-09-11 DIAGNOSIS — Z6841 Body Mass Index (BMI) 40.0 and over, adult: Secondary | ICD-10-CM | POA: Diagnosis not present

## 2019-09-11 DIAGNOSIS — Z713 Dietary counseling and surveillance: Secondary | ICD-10-CM

## 2019-09-11 DIAGNOSIS — F330 Major depressive disorder, recurrent, mild: Secondary | ICD-10-CM

## 2019-09-11 LAB — POCT URINE PREGNANCY: Preg Test, Ur: NEGATIVE

## 2019-09-11 MED ORDER — BUPROPION HCL ER (XL) 150 MG PO TB24
150.0000 mg | ORAL_TABLET | Freq: Every day | ORAL | 6 refills | Status: DC
Start: 2019-09-11 — End: 2020-06-23

## 2019-09-11 MED ORDER — MEDROXYPROGESTERONE ACETATE 10 MG PO TABS: 10.0000 mg | ORAL_TABLET | Freq: Every day | ORAL | 2 refills | Status: DC

## 2019-09-11 NOTE — Progress Notes (Signed)
GYNECOLOGY PROGRESS NOTE  Subjective:    Patient ID: Katherine Huynh, female    DOB: Jun 24, 1995, 24 y.o.   MRN: [id number]  HPI  Patient is a 24 y.o. G24P0 female with a PMH of morbid obesity who presents for 3 month follow up of infertility and PCOS.  She reports compliance with her Clomid trial and Metformin.  Notes she has hd intercourse several times last month. However notes that she has not had a cycle in over 40 days. Has not taken a pregnancy test.   On review of chart, patient has gained an additional 4 lbs since her last visit. States that she had a death in the family on the same day as her birthday.  Was not exercising as much or monitoring diet.   The following portions of the patient's history were reviewed and updated as appropriate: allergies, current medications, past family history, past medical history, past social history, past surgical history and problem list.  Review of Systems Pertinent items noted in HPI and remainder of comprehensive ROS otherwise negative.   Objective:   Blood pressure 135/83, pulse 93, height 5\' 3"  (1.6 m), weight (!) 337 lb 12.8 oz (153.2 kg), last menstrual period 07/23/2019.  Body mass index is 59.84 kg/m. General appearance: alert and no distress Remainder of exam deferred.   Labs:  Results for orders placed or performed in visit on 09/11/19  POCT urine pregnancy  Result Value Ref Range   Preg Test, Ur Negative Negative      Assessment:   1. Menstrual periods irregular   2. Body mass index (BMI) 50.0-59.9, adult (HCC)   3. Weight loss counseling, encounter for   4. PCOS (polycystic ovarian syndrome)    Plan:   1. Lengthy discussion again had that her fertility was likely being affected by not only by her PCOS, but likely by her morbid obesity.  Trial of Clomid was unsuccessful, would not recommend continuing unless patient lost at least 10% of her body weight. She states that she has already made modifications to her diet, and  states that she does not really snack or eat large portions.  She also exercises several days a week, including walking for at least 30 min, or pilates. She has been doing this for the past 2 months.  On further discussion, she reports that she has been dealing with issues of obesity for several years, has tried diets, exercising, and at one point was even placed on Phentermine (which she notes only made her sick). She states she does not have an issue with appetite suppression as she often does not eat because of depression. She has tried Zoloft, Prozac, and 1 other medication (Sarelti?). Discussion had on weight management in the setting of depression and PCOS. Discussed option of use of Wellbutrin, to help with management of her depression as well as her weight loss. Patient ok to try.  2. Discussed other options for weight loss, including natural remedies to boost metabolism, prescription for Contrave, consider another trial of Phentermine now that patient is older. Mentioned bariatric surgery, however patient states she is not ready to consider this as an option. Also encouraged increasing exercise to include cardio. She mentions starting use of an exercise bike in addition to her walking and pilates routine.  Continued to encourage.Also encouraged increased water intake (currently drinks 3-4 bottles of water approximately 3-4 days a week). Advised on increasing consumption to 6 bottles of water daily.  3. PCOS, patient with last labs  noting insulin resistance.  Prescribed Metformin last visit, which she states she has been taking. Will also recheck labs (A1c, lipid panel, DHEA, vitamin levels) for further assessment in weight management. Discussed need for trial of Provera for regulation of cycles as her cycles have become irregular again. Will not prescribe Clomid while attempting weight management and regulation of menstrual cycles.    A total of 25 minutes were spent face-to-face with the patient  during this encounter and over half of that time involved counseling and coordination of care.   Rubie Maid, MD [facility]

## 2019-09-11 NOTE — Progress Notes (Signed)
Pt present for follow up for treatment of clomid. Pt stated that she was doing well and denies any issues at this time.  UPT-NEG.

## 2019-09-11 NOTE — Patient Instructions (Signed)
Polycystic Ovarian Syndrome  Polycystic ovarian syndrome (PCOS) is a common hormonal disorder among women of reproductive age. In most women with PCOS, many small fluid-filled sacs (cysts) grow on the ovaries, and the cysts are not part of a normal menstrual cycle. PCOS can cause problems with your menstrual periods and make it difficult to get pregnant. It can also cause an increased risk of miscarriage with pregnancy. If it is not treated, PCOS can lead to serious health problems, such as diabetes and heart disease. What are the causes? The cause of PCOS is not known, but it may be the result of a combination of certain factors, such as:  Irregular menstrual cycle.  High levels of certain hormones (androgens).  Problems with the hormone that helps to control blood sugar (insulin resistance).  Certain genes. What increases the risk? This condition is more likely to develop in women who have a family history of PCOS. What are the signs or symptoms? Symptoms of PCOS may include:  Multiple ovarian cysts.  Infrequent periods or no periods.  Periods that are too frequent or too heavy.  Unpredictable periods.  Inability to get pregnant (infertility) because of not ovulating.  Increased growth of hair on the face, chest, stomach, back, thumbs, thighs, or toes.  Acne or oily skin. Acne may develop during adulthood, and it may not respond to treatment.  Pelvic pain.  Weight gain or obesity.  Patches of thickened and dark [place] or black skin on the neck, arms, breasts, or thighs (acanthosis nigricans).  Excess hair growth on the face, chest, abdomen, or upper thighs (hirsutism). How is this diagnosed? This condition is diagnosed based on:  Your medical history.  A physical exam, including a pelvic exam. Your health care provider may look for areas of increased hair growth on your skin.  Tests, such as: ? Ultrasound. This may be used to examine the ovaries and the lining of the  uterus (endometrium) for cysts. ? Blood tests. These may be used to check levels of sugar (glucose), female hormone (testosterone), and female hormones (estrogen and progesterone) in your blood. How is this treated? There is no cure for PCOS, but treatment can help to manage symptoms and prevent more health problems from developing. Treatment varies depending on:  Your symptoms.  Whether you want to have a baby or whether you need birth control (contraception). Treatment may include nutrition and lifestyle changes along with:  Progesterone hormone to start a menstrual period.  Birth control pills to help you have regular menstrual periods.  Medicines to make you ovulate, if you want to get pregnant.  Medicine to reduce excessive hair growth.  Surgery, in severe cases. This may involve making small holes in one or both of your ovaries. This decreases the amount of testosterone that your body produces. Follow these instructions at home:  Take over-the-counter and prescription medicines only as told by your health care provider.  Follow a healthy meal plan. This can help you reduce the effects of PCOS. ? Eat a healthy diet that includes lean proteins, complex carbohydrates, fresh fruits and vegetables, low-fat dairy products, and healthy fats. Make sure to eat enough fiber.  If you are overweight, lose weight as told by your health care provider. ? Losing 10% of your body weight may improve symptoms. ? Your health care provider can determine how much weight loss is best for you and can help you lose weight safely.  Keep all follow-up visits as told by your health care provider.   This is important. Contact a health care provider if:  Your symptoms do not get better with medicine.  You develop new symptoms. This information is not intended to replace advice given to you by your health care provider. Make sure you discuss any questions you have with your health care provider. Document  Revised: 04/12/2017 Document Reviewed: 10/16/2015 Elsevier Patient Education  [place].    Preventing Unhealthy [employer], Adult Staying at a healthy weight is important to your overall health. When fat builds up in your body, you may become overweight or obese. Being overweight or obese increases your risk of developing certain health problems, such as heart disease, diabetes, sleeping problems, joint problems, and some types of cancer. Unhealthy weight gain is often the result of making unhealthy food choices or not getting enough exercise. You can make changes to your lifestyle to prevent obesity and stay as healthy as possible. What nutrition changes can be made?   Eat only as much as your body needs. To do this: ? Pay attention to signs that you are hungry or full. Stop eating as soon as you feel full. ? If you feel hungry, try drinking water first before eating. Drink enough water so your urine is clear or pale yellow. ? Eat smaller portions. Pay attention to portion sizes when eating out. ? Look at serving sizes on food labels. Most foods contain more than one serving per container. ? Eat the recommended number of calories for your gender and activity level. For most active people, a daily total of 2,000 calories is appropriate. If you are trying to lose weight or are not very active, you may need to eat fewer calories. Talk with your health care provider or a diet and nutrition specialist (dietitian) about how many calories you need each day.  Choose healthy foods, such as: ? Fruits and vegetables. At each meal, try to fill at least half of your plate with fruits and vegetables. ? Whole grains, such as whole-wheat bread, [place] rice, and quinoa. ? Lean meats, such as chicken or fish. ? Other healthy proteins, such as beans, eggs, or tofu. ? Healthy fats, such as nuts, seeds, fatty fish, and olive oil. ? Low-fat or fat-free dairy products.  Check food labels, and avoid  food and drinks that: ? Are high in calories. ? Have added sugar. ? Are high in sodium. ? Have saturated fats or trans fats.  Cook foods in healthier ways, such as by baking, broiling, or grilling.  Make a meal plan for the week, and shop with a grocery list to help you stay on track with your purchases. Try to avoid going to the grocery store when you are hungry.  When grocery shopping, try to shop around the outside of the store first, where the fresh foods are. Doing this helps you to avoid prepackaged foods, which can be high in sugar, salt (sodium), and fat. What lifestyle changes can be made?   Exercise for 30 or more minutes on 5 or more days each week. Exercising may include brisk walking, yard work, biking, running, swimming, and team sports like basketball and soccer. Ask your health care provider which exercises are safe for you.  Do muscle-strengthening activities, such as lifting weights or using resistance bands, on 2 or more days a week.  Do not use any products that contain nicotine or tobacco, such as cigarettes and e-cigarettes. If you need help quitting, ask your health care provider.  Limit alcohol intake to  no more than 1 drink a day for nonpregnant women and 2 drinks a day for men. One drink equals 12 oz of beer, 5 oz of wine, or 1 oz of hard liquor.  Try to get 7-9 hours of sleep each night. What other changes can be made?  Keep a food and activity journal to keep track of: ? What you ate and how many calories you had. Remember to count the calories in sauces, dressings, and side dishes. ? Whether you were active, and what exercises you did. ? Your calorie, weight, and activity goals.  Check your weight regularly. Track any changes. If you notice you have gained weight, make changes to your diet or activity routine.  Avoid taking weight-loss medicines or supplements. Talk to your health care provider before starting any new medicine or supplement.  Talk to  your health care provider before trying any new diet or exercise plan. Why are these changes important? Eating healthy, staying active, and having healthy habits can help you to prevent obesity. Those changes also:  Help you manage stress and emotions.  Help you connect with friends and family.  Improve your self-esteem.  Improve your sleep.  Prevent long-term health problems. What can happen if changes are not made? Being obese or overweight can cause you to develop joint or bone problems, which can make it hard for you to stay active or do activities you enjoy. Being obese or overweight also puts stress on your heart and lungs and can lead to health problems like diabetes, heart disease, and some cancers. Where to find more information Talk with your health care provider or a dietitian about healthy eating and healthy lifestyle choices. You may also find information from:  U.[place]. Department of Agriculture, MyPlate: https://ball-collins.biz/  American Heart Association: www.heart.org  Centers for Disease Control and Prevention: FootballExhibition.com.br Summary  Staying at a healthy weight is important to your overall health. It helps you to prevent certain diseases and health problems, such as heart disease, diabetes, joint problems, sleep disorders, and some types of cancer.  Being obese or overweight can cause you to develop joint or bone problems, which can make it hard for you to stay active or do activities you enjoy.  You can prevent unhealthy weight gain by eating a healthy diet, exercising regularly, not smoking, limiting alcohol, and getting enough sleep.  Talk with your health care provider or a dietitian for guidance about healthy eating and healthy lifestyle choices. This information is not intended to replace advice given to you by your health care provider. Make sure you discuss any questions you have with your health care provider. Document Revised: 05/03/2017 Document Reviewed:  06/06/2016 Elsevier Patient Education  2020 [employer].

## 2019-09-17 ENCOUNTER — Encounter: Payer: Managed Care, Other (non HMO) | Admitting: Obstetrics and Gynecology

## 2019-09-19 LAB — COMPREHENSIVE METABOLIC PANEL
ALT: 15 IU/L (ref 0–32)
AST: 15 IU/L (ref 0–40)
Albumin/Globulin Ratio: 1.3 (ref 1.2–2.2)
Albumin: 4.3 g/dL (ref 3.9–5.0)
Alkaline Phosphatase: 106 IU/L (ref 39–117)
BUN/Creatinine Ratio: 10 (ref 9–23)
BUN: 9 mg/dL (ref 6–20)
Bilirubin Total: 0.2 mg/dL (ref 0.0–1.2)
CO2: 22 mmol/L (ref 20–29)
Calcium: 9.3 mg/dL (ref 8.7–10.2)
Chloride: 104 mmol/L (ref 96–106)
Creatinine, Ser: 0.89 mg/dL (ref 0.57–1.00)
GFR calc Af Amer: 105 mL/min/{1.73_m2} (ref >59)
GFR calc non Af Amer: 91 mL/min/{1.73_m2} (ref >59)
Globulin, Total: 3.4 g/dL (ref 1.5–4.5)
Glucose: 97 mg/dL (ref 65–99)
Potassium: 4.8 mmol/L (ref 3.5–5.2)
Sodium: 140 mmol/L (ref 134–144)
Total Protein: 7.7 g/dL (ref 6.0–8.5)

## 2019-09-19 LAB — CBC
Hematocrit: 40.2 % (ref 34.0–46.6)
Hemoglobin: 13 g/dL (ref 11.1–15.9)
MCH: 27 pg (ref 26.6–33.0)
MCHC: 32.3 g/dL (ref 31.5–35.7)
MCV: 84 fL (ref 79–97)
Platelets: 328 10*3/uL (ref 150–450)
RBC: 4.81 x10E6/uL (ref 3.77–5.28)
RDW: 14.9 % (ref 11.7–15.4)
WBC: 6.3 10*3/uL (ref 3.4–10.8)

## 2019-09-19 LAB — LIPID PANEL
Chol/HDL Ratio: 3.8 ratio (ref 0.0–4.4)
Cholesterol, Total: 166 mg/dL (ref 100–199)
HDL: 44 mg/dL (ref >39)
LDL Chol Calc (NIH): 108 mg/dL — ABNORMAL HIGH (ref 0–99)
Triglycerides: 73 mg/dL (ref 0–149)
VLDL Cholesterol Cal: 14 mg/dL (ref 5–40)

## 2019-09-19 LAB — HEMOGLOBIN A1C
Est. average glucose Bld gHb Est-mCnc: 117 mg/dL
Hgb A1c MFr Bld: 5.7 % — ABNORMAL HIGH (ref 4.8–5.6)

## 2019-09-19 LAB — VITAMIN B12: Vitamin B-12: 515 pg/mL (ref 232–1245)

## 2019-09-19 LAB — DHEA-SULFATE: DHEA-SO4: 275 ug/dL (ref 110.0–431.7)

## 2019-09-19 LAB — VITAMIN D 25 HYDROXY (VIT D DEFICIENCY, FRACTURES): Vit D, 25-Hydroxy: 7.7 ng/mL — ABNORMAL LOW (ref 30.0–100.0)

## 2019-09-19 LAB — INSULIN, FREE AND TOTAL
Free Insulin: 52 uU/mL — ABNORMAL HIGH
Total Insulin: 52 uU/mL

## 2019-09-20 MED ORDER — VITAMIN D (ERGOCALCIFEROL) 1.25 MG (50000 UNIT) PO CAPS
50000.0000 [IU] | ORAL_CAPSULE | ORAL | 0 refills | Status: DC
Start: 2019-09-20 — End: 2019-12-31

## 2019-09-20 NOTE — Addendum Note (Signed)
Addended by: Fabian November on: 09/20/2019 10:44 PM   Modules accepted: Orders

## 2019-10-02 ENCOUNTER — Other Ambulatory Visit: Payer: Self-pay

## 2019-10-02 ENCOUNTER — Ambulatory Visit (INDEPENDENT_AMBULATORY_CARE_PROVIDER_SITE_OTHER): Payer: Managed Care, Other (non HMO) | Admitting: Obstetrics and Gynecology

## 2019-10-02 ENCOUNTER — Encounter: Payer: Self-pay | Admitting: Obstetrics and Gynecology

## 2019-10-02 VITALS — BP 129/82 | HR 80 | Ht 63.0 in | Wt 332.2 lb

## 2019-10-02 DIAGNOSIS — Z6841 Body Mass Index (BMI) 40.0 and over, adult: Secondary | ICD-10-CM | POA: Diagnosis not present

## 2019-10-02 DIAGNOSIS — E8881 Metabolic syndrome: Secondary | ICD-10-CM

## 2019-10-02 DIAGNOSIS — E282 Polycystic ovarian syndrome: Secondary | ICD-10-CM

## 2019-10-02 DIAGNOSIS — Z7689 Persons encountering health services in other specified circumstances: Secondary | ICD-10-CM | POA: Diagnosis not present

## 2019-10-02 MED ORDER — CYANOCOBALAMIN 1000 MCG/ML IJ SOLN
1000.0000 ug | Freq: Once | INTRAMUSCULAR | Status: AC
  Administered 2019-10-02: 1000 ug via INTRAMUSCULAR

## 2019-10-02 MED ORDER — PHENTERMINE HCL 37.5 MG PO CAPS
37.5000 mg | ORAL_CAPSULE | ORAL | 0 refills | Status: DC
Start: 2019-10-02 — End: 2019-10-30

## 2019-10-02 MED ORDER — CYANOCOBALAMIN 1000 MCG/ML IJ SOLN
1000.0000 ug | INTRAMUSCULAR | 2 refills | Status: DC
Start: 2019-10-02 — End: 2020-06-23

## 2019-10-02 MED ORDER — PHENTERMINE HCL 30 MG PO CAPS: 30.0000 mg | ORAL_CAPSULE | ORAL | 0 refills | Status: DC

## 2019-10-02 NOTE — Progress Notes (Signed)
  GYNECOLOGY PROGRESS NOTE  Subjective:    Patient ID: Katherine Huynh, female    DOB: 06/08/1995, 24 y.o.   MRN: [id number]   HPI  Patient is a 24 y.o. female who presents for 1 month follow up for weight management, PCOS, and anxiety/depression.  1. Weight loss  - Current interventions:   1. Diet - Maintaining PCOS diet. Also reports that she eliminated gluten from her diet. Has increased water intake.   2. Activity - Is walking every day.  Has also begun initiating a 15 minute HIT workout at least 2 times per week.    3. Reports bowel movements are normal.     2. PCOS - Patient currently managing with Metformin, and also Provera to regulate cycles.   3.  Anxiety and depression  -Patient was initiated on Wellbutrin last visit.  Reports that she feels like the medication is helping some.  Current PHQ-9 score is 18.    The following portions of the patient's history were reviewed and updated as appropriate: allergies, current medications, past family history, past medical history, past social history, past surgical history and problem list.  Review of Systems Pertinent items noted in HPI and remainder of comprehensive ROS otherwise negative.   Objective:    Vitals with BMI 10/02/2019 09/11/2019 09/01/2019  Height 5' 3" 5' 3" 5' 3"  Weight 332 lbs 3 oz 337 lbs 13 oz 341 lbs 14 oz  BMI 58.86 59.85 60.58  Systolic 129 135 139  Diastolic 82 83 90  Pulse 80 93 97  Some encounter information is confidential and restricted. Go to Review Flowsheets activity to see all data.    General appearance: alert and no distress Abdomen: soft, non-tender.     Labs:  Results for orders placed or performed in visit on 09/11/19  Lipid panel  Result Value Ref Range   Cholesterol, Total 166 100 - 199 mg/dL   Triglycerides 73 0 - 149 mg/dL   HDL 44 >39 mg/dL   VLDL Cholesterol Cal 14 5 - 40 mg/dL   LDL Chol Calc (NIH) 108 (H) 0 - 99 mg/dL   Chol/HDL Ratio 3.8 0.0 - 4.4 ratio  Hemoglobin A1c   Result Value Ref Range   Hgb A1c MFr Bld 5.7 (H) 4.8 - 5.6 %   Est. average glucose Bld gHb Est-mCnc 117 mg/dL  Vitamin D (25 hydroxy)  Result Value Ref Range   Vit D, 25-Hydroxy 7.7 (L) 30.0 - 100.0 ng/mL  Vitamin B12  Result Value Ref Range   Vitamin B-12 515 232 - 1,245 pg/mL  CBC  Result Value Ref Range   WBC 6.3 3.4 - 10.8 x10E3/uL   RBC 4.81 3.77 - 5.28 x10E6/uL   Hemoglobin 13.0 11.1 - 15.9 g/dL   Hematocrit 40.2 34.0 - 46.6 %   MCV 84 79 - 97 fL   MCH 27.0 26.6 - 33.0 pg   MCHC 32.3 31.5 - 35.7 g/dL   RDW 14.9 11.7 - 15.4 %   Platelets 328 150 - 450 x10E3/uL  Comp Met (CMET)  Result Value Ref Range   Glucose 97 65 - 99 mg/dL   BUN 9 6 - 20 mg/dL   Creatinine, Ser 0.89 0.57 - 1.00 mg/dL   GFR calc non Af Amer 91 >59 mL/min/1.73   GFR calc Af Amer 105 >59 mL/min/1.73   BUN/Creatinine Ratio 10 9 - 23   Sodium 140 134 - 144 mmol/L   Potassium 4.8 3.5 - 5.2 mmol/L     Chloride 104 96 - 106 mmol/L   CO2 22 20 - 29 mmol/L   Calcium 9.3 8.7 - 10.2 mg/dL   Total Protein 7.7 6.0 - 8.5 g/dL   Albumin 4.3 3.9 - 5.0 g/dL   Globulin, Total 3.4 1.5 - 4.5 g/dL   Albumin/Globulin Ratio 1.3 1.2 - 2.2   Bilirubin Total 0.2 0.0 - 1.2 mg/dL   Alkaline Phosphatase 106 39 - 117 IU/L   AST 15 0 - 40 IU/L   ALT 15 0 - 32 IU/L  DHEA-sulfate  Result Value Ref Range   DHEA-SO4 275.0 110.0 - 431.7 ug/dL  Insulin, Free and Total  Result Value Ref Range   Free Insulin 52 (H) uU/mL   Total Insulin 52 uU/mL  POCT urine pregnancy  Result Value Ref Range   Preg Test, Ur Negative Negative    Assessment:   Weight management Obesity, Body mass index is 58.85 kg/m. PCOS Anxiety and depression Vitamin D deficiency  Plan:   1. Weight management - Discussion had with patient regarding weight loss management.  Patient has lost approximately 5 pounds since her last visit.  Continue to encourage dietary plan and exercise.  Also will continue Wellbutrin and Metformin.  Discussion had again  with patient regarding additional attempts at weight loss with use of medications.  Advised that she have the option to continue on current course, or could consider use of other weight loss medications to accelerate weight loss.  Patient does have a history of use of phentermine in the remote past as a teenager, however at that time noted that it was ineffective (however patient now cites that it may have been due to her lack of adherence to dietary and exercise requirements at that time).  She states that she is willing to give weight loss medications another try.  Will prescribe phentermine.  Advised that medication was to be used for the first 3 months and she will lose approximately 5 to 10% of her body weight within those 3 months.  If after 3 months she has significant weight loss noted can potentially continue for an additional 3 months, or until weight loss plateaus.  Also discussed that this medication was considered to be a controlled substance and made recommendations for judicious and responsible use of the medication.  Patient notes understanding is okay to begin medication.  Will prescribe. Also given initial injection of Vitamin B12 for additive effects of weight loss.  2. PCOS - patient to continue Metformin for management of insulin resistance and Provera for regulation of menstrual cycles.  3. Anxiety and depression - patient notes she is doing better on the Wellbutrin.  PHQ-9 score is still elevated, however overall patient noting some improvement in symptoms. Will continue to monitor.  4. Vitamin D deficiency - given prescription for weekly high dose Vitamin D regimen. Patient reports compliance with medication  RTC in 1 month for weight check and B12 injection.   A total of 15 minutes were spent face-to-face with the patient during this encounter and over half of that time dealt with counseling and coordination of care.  Cherry, Anika, MD [facility]  

## 2019-10-02 NOTE — Progress Notes (Signed)
Pt present for follow up for PCOS symptoms and to discuss weight management. Pt stated that she was doing well no currently issues at this time. PHQ-9=18.

## 2019-10-02 NOTE — Patient Instructions (Signed)
Phentermine tablets or capsules What is this medicine? PHENTERMINE (FEN ter meen) decreases your appetite. It is used with a reduced calorie diet and exercise to help you lose weight. This medicine may be used for other purposes; ask your health care provider or pharmacist if you have questions. COMMON BRAND NAME(S): Adipex-P, Atti-Plex P, Atti-Plex P Spansule, Fastin, Lomaira, Pro-Fast, Tara-8 What should I tell my health care provider before I take this medicine? They need to know if you have any of these conditions:  agitation or nervousness  diabetes  glaucoma  heart disease  high blood pressure  history of drug abuse or addiction  history of stroke  kidney disease  lung disease called Primary Pulmonary Hypertension (PPH)  taken an MAOI like Carbex, Eldepryl, Marplan, Nardil, or Parnate in last 14 days  taking stimulant medicines for attention disorders, weight loss, or to stay awake  thyroid disease  an unusual or allergic reaction to phentermine, other medicines, foods, dyes, or preservatives  pregnant or trying to get pregnant  breast-feeding How should I use this medicine? Take this medicine by mouth with a glass of water. Follow the directions on the prescription label. Take your medicine at regular intervals. Do not take it more often than directed. Do not stop taking except on your doctor's advice. Talk to your pediatrician regarding the use of this medicine in children. While this drug may be prescribed for children 17 years or older for selected conditions, precautions do apply. Overdosage: If you think you have taken too much of this medicine contact a poison control center or emergency room at once. NOTE: This medicine is only for you. Do not share this medicine with others. What if I miss a dose? If you miss a dose, take it as soon as you can. If it is almost time for your next dose, take only that dose. Do not take double or extra doses. What may interact  with this medicine? Do not take this medicine with any of the following medications:  MAOIs like Carbex, Eldepryl, Marplan, Nardil, and Parnate This medicine may also interact with the following medications:  alcohol  certain medicines for depression, anxiety, or psychotic disorders  certain medicines for high blood pressure  linezolid  medicines for colds or breathing difficulties like pseudoephedrine or phenylephrine  medicines for diabetes  sibutramine  stimulant medicines for attention disorders, weight loss, or to stay awake This list may not describe all possible interactions. Give your health care provider a list of all the medicines, herbs, non-prescription drugs, or dietary supplements you use. Also tell them if you smoke, drink alcohol, or use illegal drugs. Some items may interact with your medicine. What should I watch for while using this medicine? Visit your doctor or health care provider for regular checks on your progress. Do not stop taking except on your health care provider's advice. You may develop a severe reaction. Your health care provider will tell you how much medicine to take. Do not take this medicine close to bedtime. It may prevent you from sleeping. You may get drowsy or dizzy. Do not drive, use machinery, or do anything that needs mental alertness until you know how this medicine affects you. Do not stand or sit up quickly, especially if you are an older patient. This reduces the risk of dizzy or fainting spells. Alcohol may increase dizziness and drowsiness. Avoid alcoholic drinks. This medicine may affect blood sugar levels. Ask your healthcare provider if changes in diet or medicines are needed   if you have diabetes. Women should inform their health care provider if they wish to become pregnant or think they might be pregnant. Losing weight while pregnant is not advised and may cause harm to the unborn child. Talk to your health care provider for more  information. What side effects may I notice from receiving this medicine? Side effects that you should report to your doctor or health care professional as soon as possible:  allergic reactions like skin rash, itching or hives, swelling of the face, lips, or tongue  breathing problems  changes in emotions or moods  changes in vision  chest pain or chest tightness  fast, irregular heartbeat  feeling faint or lightheaded  increased blood pressure  irritable  restlessness  tremors  seizures  signs and symptoms of a stroke like changes in vision; confusion; trouble speaking or understanding; severe headaches; sudden numbness or weakness of the face, arm or leg; trouble walking; dizziness; loss of balance or coordination  unusually weak or tired Side effects that usually do not require medical attention (report to your doctor or health care professional if they continue or are bothersome):  changes in taste  constipation or diarrhea  dizziness  dry mouth  headache  trouble sleeping  upset stomach This list may not describe all possible side effects. Call your doctor for medical advice about side effects. You may report side effects to FDA at 1-800-FDA-1088. Where should I keep my medicine? Keep out of the reach of children. This medicine can be abused. Keep your medicine in a safe place to protect it from theft. Do not share this medicine with anyone. Selling or giving away this medicine is dangerous and against the law. This medicine may cause harm and death if it is taken by other adults, children, or pets. Return medicine that has not been used to an official disposal site. Contact the DEA at 1-800-882-9539 or your city/county government to find a site. If you cannot return the medicine, mix any unused medicine with a substance like cat litter or coffee grounds. Then throw the medicine away in a sealed container like a sealed bag or coffee can with a lid. Do not use the  medicine after the expiration date. Store at room temperature between 20 and 25 degrees C (68 and 77 degrees F). Keep container tightly closed. NOTE: This sheet is a summary. It may not cover all possible information. If you have questions about this medicine, talk to your doctor, pharmacist, or health care provider.  2020 Elsevier/Gold Standard (2019-03-06 12:54:20)  

## 2019-10-05 ENCOUNTER — Telehealth: Payer: Self-pay | Admitting: Obstetrics and Gynecology

## 2019-10-05 NOTE — Telephone Encounter (Signed)
Please advise. Thanks Edelmira Gallogly 

## 2019-10-05 NOTE — Telephone Encounter (Signed)
This was correct.  She will take the low dose (30 mg) for 7 days, then increase to the 37.5 mg dosing.   Dr. Valentino Saxon

## 2019-10-05 NOTE — Telephone Encounter (Signed)
[facility] pharmacy called in and said they received two prescriptions for phentermine- one is 30 mg but only 7 capsules and the other is 37.5 mg. Pharmacist said there was no note indicating directions on how the patient should take medication. Stated that in her mind she thinks provider wants patient to take 30 mg for 7 days then go up to the 37.5 mg capsule's. Could you please advise?

## 2019-10-06 NOTE — Telephone Encounter (Signed)
Spoke to pharmacy and informed them that the ordered sent in were correct. Pt was to take the low dose of 30mg  of phenetamine for 7 days and then increase to the 37.5mg .

## 2019-10-30 ENCOUNTER — Encounter: Payer: Self-pay | Admitting: Obstetrics and Gynecology

## 2019-10-30 ENCOUNTER — Other Ambulatory Visit: Payer: Self-pay

## 2019-10-30 ENCOUNTER — Ambulatory Visit (INDEPENDENT_AMBULATORY_CARE_PROVIDER_SITE_OTHER): Payer: Managed Care, Other (non HMO) | Admitting: Obstetrics and Gynecology

## 2019-10-30 VITALS — BP 138/91 | HR 94 | Ht 63.0 in | Wt 330.2 lb

## 2019-10-30 DIAGNOSIS — F330 Major depressive disorder, recurrent, mild: Secondary | ICD-10-CM

## 2019-10-30 DIAGNOSIS — Z7689 Persons encountering health services in other specified circumstances: Secondary | ICD-10-CM | POA: Diagnosis not present

## 2019-10-30 DIAGNOSIS — E8881 Metabolic syndrome: Secondary | ICD-10-CM | POA: Diagnosis not present

## 2019-10-30 DIAGNOSIS — E282 Polycystic ovarian syndrome: Secondary | ICD-10-CM | POA: Diagnosis not present

## 2019-10-30 DIAGNOSIS — Z6841 Body Mass Index (BMI) 40.0 and over, adult: Secondary | ICD-10-CM

## 2019-10-30 MED ORDER — PHENTERMINE HCL 37.5 MG PO CAPS: 37.5000 mg | ORAL_CAPSULE | ORAL | 2 refills | Status: DC

## 2019-10-30 MED ORDER — PHENTERMINE HCL 37.5 MG PO CAPS: 37.5000 mg | ORAL_CAPSULE | ORAL | 0 refills | Status: DC

## 2019-10-30 MED ORDER — CYANOCOBALAMIN 1000 MCG/ML IJ SOLN
1000.0000 ug | Freq: Once | INTRAMUSCULAR | Status: AC
  Administered 2019-10-30: 1000 ug via INTRAMUSCULAR

## 2019-10-30 NOTE — Patient Instructions (Signed)
Stress, Adult Stress is a normal reaction to life events. Stress is what you feel when life demands more than you are used to, or more than you think you can handle. Some stress can be useful, such as studying for a test or meeting a deadline at work. Stress that occurs too often or for too long can cause problems. It can affect your emotional health and interfere with relationships and normal daily activities. Too much stress can weaken your body's defense system (immune system) and increase your risk for physical illness. If you already have a medical problem, stress can make it worse. What are the causes? All sorts of life events can cause stress. An event that causes stress for one person may not be stressful for another person. Major life events, whether positive or negative, commonly cause stress. Examples include:  Losing a job or starting a new job.  Losing a loved one.  Moving to a new town or home.  Getting married or divorced.  Having a baby.  Getting injured or sick. Less obvious life events can also cause stress, especially if they occur day after day or in combination with each other. Examples include:  Working long hours.  Driving in traffic.  Caring for children.  Being in debt.  Being in a difficult relationship. What are the signs or symptoms? Stress can cause emotional symptoms, including:  Anxiety. This is feeling worried, afraid, on edge, overwhelmed, or out of control.  Anger, including irritation or impatience.  Depression. This is feeling sad, down, helpless, or guilty.  Trouble focusing, remembering, or making decisions. Stress can cause physical symptoms, including:  Aches and pains. These may affect your head, neck, back, stomach, or other areas of your body.  Tight muscles or a clenched jaw.  Low energy.  Trouble sleeping. Stress can cause unhealthy behaviors, including:  Eating to feel better (overeating) or skipping meals.  Working too  much or putting off tasks.  Smoking, drinking alcohol, or using drugs to feel better. How is this diagnosed? Stress is diagnosed through an assessment by your health care provider. He or she may diagnose this condition based on:  Your symptoms and any stressful life events.  Your medical history.  Tests to rule out other causes of your symptoms. Depending on your condition, your health care provider may refer you to a specialist for further evaluation. How is this treated?  Stress management techniques are the recommended treatment for stress. Medicine is not typically recommended for the treatment of stress. Techniques to reduce your reaction to stressful life events include:  Stress identification. Monitor yourself for symptoms of stress and identify what causes stress for you. These skills may help you to avoid or prepare for stressful events.  Time management. Set your priorities, keep a calendar of events, and learn to say no. Taking these actions can help you avoid making too many commitments. Techniques for coping with stress include:  Rethinking the problem. Try to think realistically about stressful events rather than ignoring them or overreacting. Try to find the positives in a stressful situation rather than focusing on the negatives.  Exercise. Physical exercise can release both physical and emotional tension. The key is to find a form of exercise that you enjoy and do it regularly.  Relaxation techniques. These relax the body and mind. The key is to find one or more that you enjoy and use the techniques regularly. Examples include: ? Meditation, deep breathing, or progressive relaxation techniques. ? Yoga or   tai chi. ? Biofeedback, mindfulness techniques, or journaling. ? Listening to music, being out in nature, or participating in other hobbies.  Practicing a healthy lifestyle. Eat a balanced diet, drink plenty of water, limit or avoid caffeine, and get plenty of  sleep.  Having a strong support network. Spend time with family, friends, or other people you enjoy being around. Express your feelings and talk things over with someone you trust. Counseling or talk therapy with a mental health professional may be helpful if you are having trouble managing stress on your own. Follow these instructions at home: Lifestyle   Avoid drugs.  Do not use any products that contain nicotine or tobacco, such as cigarettes, e-cigarettes, and chewing tobacco. If you need help quitting, ask your health care provider.  Limit alcohol intake to no more than 1 drink a day for nonpregnant women and 2 drinks a day for men. One drink equals 12 oz of beer, 5 oz of wine, or 1 oz of hard liquor  Do not use alcohol or drugs to relax.  Eat a balanced diet that includes fresh fruits and vegetables, whole grains, lean meats, fish, eggs, and beans, and low-fat dairy. Avoid processed foods and foods high in added fat, sugar, and salt.  Exercise at least 30 minutes on 5 or more days each week.  Get 7-8 hours of sleep each night. General instructions   Practice stress management techniques as discussed with your health care provider.  Drink enough fluid to keep your urine clear or pale yellow.  Take over-the-counter and prescription medicines only as told by your health care provider.  Keep all follow-up visits as told by your health care provider. This is important. Contact a health care provider if:  Your symptoms get worse.  You have new symptoms.  You feel overwhelmed by your problems and can no longer manage them on your own. Get help right away if:  You have thoughts of hurting yourself or others. If you ever feel like you may hurt yourself or others, or have thoughts about taking your own life, get help right away. You can go to your nearest emergency department or call:  Your local emergency services (911 in the [place]  A suicide crisis helpline, such as the  [place] at (316) 250-6172. This is open 24 hours a day. Summary  Stress is a normal reaction to life events. It can cause problems if it happens too often or for too long.  Practicing stress management techniques is the best way to treat stress.  Counseling or talk therapy with a mental health professional may be helpful if you are having trouble managing stress on your own. This information is not intended to replace advice given to you by your health care provider. Make sure you discuss any questions you have with your health care provider. Document Revised: 11/28/2018 Document Reviewed: 06/20/2016 Elsevier Patient Education  [place].

## 2019-10-30 NOTE — Progress Notes (Signed)
GYNECOLOGY PROGRESS NOTE  Subjective:    Patient ID: Katherine Huynh, female    DOB: [birth year], 25 y.o.   MRN: [id number]   HPI  Patient is a 24 y.o. female who presents for 1 month follow up for weight management.  She has been on phentermine for 1 months. She also has a h/o PCOS, and anxiety/depression, currently on Wellbutrin and Metformin.  She reports major stressors this past month (several family members requiring urgent surgeries, issues in her relationship, recently to start a new job, works 40 hours per week).    1. Weight loss  - Current interventions:   1. Diet - Maintaining PCOS diet, and still working on maintaining a gluten-free/gluten-less diet. Does note diet was not as adhered to this month due to life stressors. Has increased water intake to 6 bottles per day.   2. Activity - Is walking every day but not for as long due to the weather being too hot).  Has now joined gym to help.              3. Reports bowel movements are normal.     2. PCOS - Patient currently managing with Metformin, and also Provera to regulate cycles.   3.  Anxiety and depression  - Reports that she feels like the medication is helping.  Current PHQ-9 score is .    The following portions of the patient's history were reviewed and updated as appropriate: allergies, current medications, past family history, past medical history, past social history, past surgical history and problem list.  Review of Systems Pertinent items noted in HPI and remainder of comprehensive ROS otherwise negative.   Objective:    Vitals with BMI 10/30/2019 10/02/2019 09/11/2019  Height 5\' 3"  5\' 3"  5\' 3"   Weight 330 lbs 3 oz 332 lbs 3 oz 337 lbs 13 oz  BMI 58.51 75.64 33.29  Systolic 518 841 660  Diastolic 91 82 83  Pulse 94 80 93  Some encounter information is confidential and restricted. Go to Review Flowsheets activity to see all data.    General appearance: alert and no distress Abdomen: soft, non-tender.    Waist circumference 49 inches.    Labs:  No new labs  Assessment:   Weight management Obesity, Body mass index is 58.49 kg/m. PCOS Anxiety and depression Vitamin D deficiency  Plan:   1. Weight management -  Patient has lost approximately 2 pounds since her last visit. Notes she was a little less compliant with her diet due to stressors, also was not exercising for as long due to weather.  Continue to encourage stricter engagement of dietary plan and exercise.  Now will be going to the gym to combat weather issues. Notes stressors are improving in her life so she should be able to go back to making better food choices.  Patient will continue use of Phentermine.  Will increase from 30 mg dosing to 37.5 mg dosing. Also will continue Wellbutrin and Metformin.  Also given initial injection of Vitamin B12 for additive effects of weight loss.   2. PCOS - patient to continue Metformin for management of insulin resistance and Provera for regulation of menstrual cycles.   3. Anxiety and depression - patient notes she is doing better on the Wellbutrin.  PHQ-9 score is still elevated, but improved (from 18 down to 12).  Can continue at current dosing. Given handout on ways to manage stress (helping to avoid coping with food).   4. Vitamin D  deficiency - given prescription for weekly high dose Vitamin D regimen. Patient reports compliance with medication.  Will repeat levels at annual exam in 1-2 months.   RTC in 1 month for weight check and B12 injection. Return in 2 months for annual exam.   A total of 15 minutes were spent face-to-face with the patient during this encounter and over half of that time dealt with counseling and coordination of care.  Hildred Laser, MD [facility]

## 2019-10-30 NOTE — Progress Notes (Addendum)
Pt present for weight management. Pt's last visit 10/02/2019 332 lbs. Pt stated not having any side effects from the mediation phentermine. Pt stated that she was doing well. B-12 injection administered. PHQ-9=12.

## 2019-12-23 ENCOUNTER — Encounter: Payer: Self-pay | Admitting: Obstetrics and Gynecology

## 2019-12-23 ENCOUNTER — Ambulatory Visit: Payer: Managed Care, Other (non HMO) | Admitting: Obstetrics and Gynecology

## 2019-12-23 VITALS — BP 112/71 | HR 87 | Ht 63.0 in | Wt 334.4 lb

## 2019-12-23 DIAGNOSIS — Z6841 Body Mass Index (BMI) 40.0 and over, adult: Secondary | ICD-10-CM | POA: Diagnosis not present

## 2019-12-23 DIAGNOSIS — R399 Unspecified symptoms and signs involving the genitourinary system: Secondary | ICD-10-CM

## 2019-12-23 DIAGNOSIS — N926 Irregular menstruation, unspecified: Secondary | ICD-10-CM

## 2019-12-23 DIAGNOSIS — N970 Female infertility associated with anovulation: Secondary | ICD-10-CM

## 2019-12-23 DIAGNOSIS — E282 Polycystic ovarian syndrome: Secondary | ICD-10-CM | POA: Diagnosis not present

## 2019-12-23 LAB — POCT URINALYSIS DIPSTICK
Bilirubin, UA: NEGATIVE
Glucose, UA: NEGATIVE
Ketones, UA: NEGATIVE
Nitrite, UA: POSITIVE
Protein, UA: POSITIVE — AB
Spec Grav, UA: >=1.03 — AB (ref 1.010–1.025)
Urobilinogen, UA: 0.2 E.U./dL
pH, UA: 6 (ref 5.0–8.0)

## 2019-12-23 LAB — POCT URINE PREGNANCY: Preg Test, Ur: NEGATIVE

## 2019-12-23 MED ORDER — NITROFURANTOIN MONOHYD MACRO 100 MG PO CAPS: 100.0000 mg | ORAL_CAPSULE | Freq: Two times a day (BID) | ORAL | 1 refills | Status: DC

## 2019-12-23 NOTE — Patient Instructions (Signed)
Hysterosalpingography  Hysterosalpingography is a procedure in which a woman's uterus and fallopian tubes are examined. During this procedure, contrast dye is injected into the uterus through the vagina and cervix. X-rays are then taken. The dye makes the uterus and fallopian tubes show up clearly on the X-rays. This procedure may be done:  To help determine whether there are tumors, scars (adhesions), or other abnormalities in the uterus.  To find out why a woman is unable to have children (infertile).  To make sure the fallopian tubes are completely blocked a few months after having certain tubal sterilization procedures. Tell a health care provider about:  Any allergies you have.  All medicines you are taking, including vitamins, herbs, eye drops, creams, and over-the-counter medicines.  Any problems you or family members have had with the use of anesthetic medicines.  Any blood disorders you have.  Any surgeries you have had.  Any medical conditions you have.  Whether you are pregnant or may be pregnant. What are the risks? Generally, this is a safe procedure. However, problems may occur, including:  Infection in the lining of the uterus (endometritis) or fallopian tubes (salpingitis).  Allergic reaction to medicines or dyes.  Risk of making a hole (perforation) in the uterus or fallopian tubes.  Damage to other structures or organs. What happens before the procedure?  Schedule the procedure after your menstrual period stops, but before your next ovulation. This is usually between day 5 and day 10 of your last period. Day 1 is the first day of your period.  Ask your health care provider about changing or stopping your regular medicines. This is especially important if you are taking diabetes medicines or blood thinners.  Empty your bladder before the procedure begins.  Plan to have someone take you home from the hospital or clinic. What happens during the  procedure?  You may be given one of the following: ? A medicine to help you relax (sedative). ? An over-the-counter pain medicine.  You will lie down on your back and place your feet into footrests (stirrups).  A device called a speculum will be inserted into your vagina. This allows your health care provider to see inside your vagina through to your cervix.  Your cervix will be washed with a germ-killing soap.  A medicine may be injected into your cervix to numb it (local anesthesia).  A thin, flexible tube will be passed through your cervix into your uterus.  Contrast dye will be passed through the tube and into the uterus. Contrast dye may cause some cramping.  Several X-rays will be taken as the contrast dye spreads through the uterus and into the fallopian tubes.  The tube will be removed. The contrast dye will flow out through your vagina naturally. The procedure may vary among health care providers and hospitals. What happens after the procedure?  Most of the contrast dye will flow out of your vagina naturally. You may want to wear a sanitary pad.  You may have mild cramping and vaginal bleeding. This should go away after a short time.  Do not drive for 24 hours if you were given a sedative.  It is up to you to get the results of your procedure. Ask your health care provider, or the department that is doing the procedure, when your results will be ready. Summary  Hysterosalpingography is a procedure in which a woman's uterus and fallopian tubes are examined.  During this procedure, contrast dye is injected into the uterus  through the vagina and cervix. X-rays are then taken. The dye helps the uterus and fallopian tubes show up clearly on the X-rays.  Schedule the procedure after your menstrual period stops, but before your next ovulation. This is usually between day 5 and day 10 of your last period.  After the procedure, you may have mild cramping and vaginal bleeding.  This should go away after a short time. This information is not intended to replace advice given to you by your health care provider. Make sure you discuss any questions you have with your health care provider. Document Revised: 04/12/2017 Document Reviewed: 05/23/2016 Elsevier Patient Education  2020 [employer].

## 2019-12-23 NOTE — Progress Notes (Signed)
Pt present due to having positive and negative pregnancy tests. Pt had a Beta lab drawn today. Pt stated having uti symptoms pain with urination and freq urination. UPT and UA completed. Nitrite present and Urine culture completed.

## 2019-12-23 NOTE — Progress Notes (Signed)
GYNECOLOGY PROGRESS NOTE  Subjective:    Patient ID: Katherine Huynh, female    DOB: 1995/11/15, 24 y.o.   MRN: [id number]  HPI  Patient is a 24 y.o. G46P0 female who presents for further discussion and management of her PCOS and infertility.  She states that she took progesterone to induce her period, however her cycle did not come.  It has been ~ 2 months. Also notes that she took multiple home pregnancy tests (~ 10), and had some that were positive and some that were negative. Inquires into further testing as she feels like there is something else going on as to why she is unable to become pregnant. Notes several members having testing done and finding out that their tubes were blocked.  She also notes that she feels that she has more mucus production overall (especially in sinuses) which might be affecting her fertility.   She is also complaining of urinary symptoms, including pain with urination and frequency.    Lastly, she is noting some frustration as she is working out and still maintaining her diet, however is no longer losing weight (actually is regaining weight).  She does not feel as though she has much of an appetite.  She has stopped use of her Metformin and phentermine. She is now using Ashwaganda to help manage her stress. She does note however that she feels like her energy levels and her endurance has improved since she has been increasing her physical activity.    The following portions of the patient's history were reviewed and updated as appropriate: allergies, current medications, past family history, past medical history, past social history, past surgical history and problem list.  Review of Systems Pertinent items noted in HPI and remainder of comprehensive ROS otherwise negative.   Objective:   Blood pressure 112/71, pulse 87, height 5\' 3"  (1.6 m), weight (!) 334 lb 6.4 oz (151.7 kg), last menstrual period 10/24/2019. General appearance: alert and no distress Remainder  of exam deferred.    Labs:  Results for orders placed or performed in visit on 12/23/19  POCT urine pregnancy  Result Value Ref Range   Preg Test, Ur Negative Negative  POCT urinalysis dipstick  Result Value Ref Range   Color, UA yellow    Clarity, UA cloudy    Glucose, UA Negative Negative   Bilirubin, UA neg    Ketones, UA neg    Spec Grav, UA >=1.030 (A) 1.010 - 1.025   Blood, UA large +3    pH, UA 6.0 5.0 - 8.0   Protein, UA Positive (A) Negative   Urobilinogen, UA 0.2 0.2 or 1.0 E.U./dL   Nitrite, UA positive    Leukocytes, UA Moderate (2+) (A) Negative   Appearance yellow;cloudy    Odor      Assessment:   1. Missed menses   2. UTI symptoms   3. BMI 50.0-59.9, adult (HCC)   4. PCO (polycystic ovaries)   5. Primary anovulatory infertility    Plan:   1. Missed menses, has had success with Provera challenge in the past, however notes this time she did not have a cycle.  Previously offered estrogen supplementation in addition to the progesterone, however patient declined use. UPT in office negative today, but will order BHCG prior to any other interventions as patient noted both positive and negative home UPTs.  2. Infertility likely secondary to anovulation (PCOS), also with morbid obesity. Patient notes she desires further workup with [facility] due to family history  of infertility.  Current partner, age 40, has other children. Discussed risk factors for tubal infertility including previous pelvic surgeries, infections, prior ectopic pregnancies, or endometriosis.  Patient without risk factors but still desires testing.  Will schedule. Patient notes she will try one more round of progesterone prior to procedure to induce cycle. 3. UTI noted on UA today. Will prescribe Macrobid for symptoms.   A total of 15 minutes were spent face-to-face with the patient during this encounter and over half of that time dealt with counseling and coordination of care.   Hildred Laser,  MD [facility]

## 2019-12-24 ENCOUNTER — Encounter: Payer: Self-pay | Admitting: Obstetrics and Gynecology

## 2019-12-24 LAB — BETA HCG QUANT (REF LAB): hCG Quant: <1 m[IU]/mL

## 2019-12-25 ENCOUNTER — Telehealth: Payer: Self-pay | Admitting: Obstetrics and Gynecology

## 2019-12-25 NOTE — Telephone Encounter (Signed)
Called pt left voicemail to return call. Appt I scheduled for Sep 1 at 1:30 [facility].

## 2019-12-26 LAB — URINE CULTURE

## 2019-12-29 NOTE — Patient Instructions (Addendum)
Preventive Care 21-24 Years Old, Female Preventive care refers to visits with your health care provider and lifestyle choices that can promote health and wellness. This includes:  A yearly physical exam. This may also be called an annual well check.  Regular dental visits and eye exams.  Immunizations.  Screening for certain conditions.  Healthy lifestyle choices, such as eating a healthy diet, getting regular exercise, not using drugs or products that contain nicotine and tobacco, and limiting alcohol use. What can I expect for my preventive care visit? Physical exam Your health care provider will check your:  Height and weight. This may be used to calculate body mass index (BMI), which tells if you are at a healthy weight.  Heart rate and blood pressure.  Skin for abnormal spots. Counseling Your health care provider may ask you questions about your:  Alcohol, tobacco, and drug use.  Emotional well-being.  Home and relationship well-being.  Sexual activity.  Eating habits.  Work and work environment.  Method of birth control.  Menstrual cycle.  Pregnancy history. What immunizations do I need?  Influenza (flu) vaccine  This is recommended every year. Tetanus, diphtheria, and pertussis (Tdap) vaccine  You may need a Td booster every 10 years. Varicella (chickenpox) vaccine  You may need this if you have not been vaccinated. Human papillomavirus (HPV) vaccine  If recommended by your health care provider, you may need three doses over 6 months. Measles, mumps, and rubella (MMR) vaccine  You may need at least one dose of MMR. You may also need a second dose. Meningococcal conjugate (MenACWY) vaccine  One dose is recommended if you are age 19-21 years and a first-year college student living in a residence hall, or if you have one of several medical conditions. You may also need additional booster doses. Pneumococcal conjugate (PCV13) vaccine  You may need  this if you have certain conditions and were not previously vaccinated. Pneumococcal polysaccharide (PPSV23) vaccine  You may need one or two doses if you smoke cigarettes or if you have certain conditions. Hepatitis A vaccine  You may need this if you have certain conditions or if you travel or work in places where you may be exposed to hepatitis A. Hepatitis B vaccine  You may need this if you have certain conditions or if you travel or work in places where you may be exposed to hepatitis B. Haemophilus influenzae type b (Hib) vaccine  You may need this if you have certain conditions. You may receive vaccines as individual doses or as more than one vaccine together in one shot (combination vaccines). Talk with your health care provider about the risks and benefits of combination vaccines. What tests do I need?  Blood tests  Lipid and cholesterol levels. These may be checked every 5 years starting at age 20.  Hepatitis C test.  Hepatitis B test. Screening  Diabetes screening. This is done by checking your blood sugar (glucose) after you have not eaten for a while (fasting).  Sexually transmitted disease (STD) testing.  BRCA-related cancer screening. This may be done if you have a family history of breast, ovarian, tubal, or peritoneal cancers.  Pelvic exam and Pap test. This may be done every 3 years starting at age 21. Starting at age 30, this may be done every 5 years if you have a Pap test in combination with an HPV test. Talk with your health care provider about your test results, treatment options, and if necessary, the need for more tests.   Follow these instructions at home: Eating and drinking   Eat a diet that includes fresh fruits and vegetables, whole grains, lean protein, and low-fat dairy.  Take vitamin and mineral supplements as recommended by your health care provider.  Do not drink alcohol if: ? Your health care provider tells you not to drink. ? You are  pregnant, may be pregnant, or are planning to become pregnant.  If you drink alcohol: ? Limit how much you have to 0-1 drink a day. ? Be aware of how much alcohol is in your drink. In the [place] one drink equals one 12 oz bottle of beer (355 mL), one 5 oz glass of wine (148 mL), or one 1 oz glass of hard liquor (44 mL). Lifestyle  Take daily care of your teeth and gums.  Stay active. Exercise for at least 30 minutes on 5 or more days each week.  Do not use any products that contain nicotine or tobacco, such as cigarettes, e-cigarettes, and chewing tobacco. If you need help quitting, ask your health care provider.  If you are sexually active, practice safe sex. Use a condom or other form of birth control (contraception) in order to prevent pregnancy and STIs (sexually transmitted infections). If you plan to become pregnant, see your health care provider for a preconception visit. What's next?  Visit your health care provider once a year for a well check visit.  Ask your health care provider how often you should have your eyes and teeth checked.  Stay up to date on all vaccines. This information is not intended to replace advice given to you by your health care provider. Make sure you discuss any questions you have with your health care provider. Document Revised: 01/09/2018 Document Reviewed: 01/09/2018 Elsevier Patient Education  2020 [employer] Breast Self-Awareness Breast self-awareness is knowing how your breasts look and feel. Doing breast self-awareness is important. It allows you to catch a breast problem early while it is still small and can be treated. All women should do breast self-awareness, including women who have had breast implants. Tell your doctor if you notice a change in your breasts. What you need:  A mirror.  A well-lit room. How to do a breast self-exam A breast self-exam is one way to learn what is normal for your breasts and to check for changes. To do a  breast self-exam: Look for changes  1. Take off all the clothes above your waist. 2. Stand in front of a mirror in a room with good lighting. 3. Put your hands on your hips. 4. Push your hands down. 5. Look at your breasts and nipples in the mirror to see if one breast or nipple looks different from the other. Check to see if: ? The shape of one breast is different. ? The size of one breast is different. ? There are wrinkles, dips, and bumps in one breast and not the other. 6. Look at each breast for changes in the skin, such as: ? Redness. ? Scaly areas. 7. Look for changes in your nipples, such as: ? Liquid around the nipples. ? Bleeding. ? Dimpling. ? Redness. ? A change in where the nipples are. Feel for changes  1. Lie on your back on the floor. 2. Feel each breast. To do this, follow these steps: ? Pick a breast to feel. ? Put the arm closest to that breast above your head. ? Use your other arm to feel the nipple area of your breast. Feel   the area with the pads of your three middle fingers by making small circles with your fingers. For the first circle, press lightly. For the second circle, press harder. For the third circle, press even harder. ? Keep making circles with your fingers at the different pressures as you move down your breast. Stop when you feel your ribs. ? Move your fingers a little toward the center of your body. ? Start making circles with your fingers again, this time going up until you reach your collarbone. ? Keep making up-and-down circles until you reach your armpit. Remember to keep using the three pressures. ? Feel the other breast in the same way. 3. Sit or stand in the tub or shower. 4. With soapy water on your skin, feel each breast the same way you did in step 2 when you were lying on the floor. Write down what you find Writing down what you find can help you remember what to tell your doctor. Write down:  What is normal for each breast.  Any  changes you find in each breast, including: ? The kind of changes you find. ? Whether you have pain. ? Size and location of any lumps.  When you last had your menstrual period. General tips  Check your breasts every month.  If you are breastfeeding, the best time to check your breasts is after you feed your baby or after you use a breast pump.  If you get menstrual periods, the best time to check your breasts is 5-7 days after your menstrual period is over.  With time, you will become comfortable with the self-exam, and you will begin to know if there are changes in your breasts. Contact a doctor if you:  See a change in the shape or size of your breasts or nipples.  See a change in the skin of your breast or nipples, such as red or scaly skin.  Have fluid coming from your nipples that is not normal.  Find a lump or thick area that was not there before.  Have pain in your breasts.  Have any concerns about your breast health. Summary  Breast self-awareness includes looking for changes in your breasts, as well as feeling for changes within your breasts.  Breast self-awareness should be done in front of a mirror in a well-lit room.  You should check your breasts every month. If you get menstrual periods, the best time to check your breasts is 5-7 days after your menstrual period is over.  Let your doctor know of any changes you see in your breasts, including changes in size, changes on the skin, pain or tenderness, or fluid from your nipples that is not normal. This information is not intended to replace advice given to you by your health care provider. Make sure you discuss any questions you have with your health care provider. Document Revised: 12/17/2017 Document Reviewed: 12/17/2017 Elsevier Patient Education  [employer] Maintenance, Female Adopting a healthy lifestyle and getting preventive care are important in promoting health and wellness. Ask your  health care provider about:  The right schedule for you to have regular tests and exams.  Things you can do on your own to prevent diseases and keep yourself healthy. What should I know about diet, weight, and exercise? Eat a healthy diet   Eat a diet that includes plenty of vegetables, fruits, low-fat dairy products, and lean protein.  Do not eat a lot of foods that are high in solid  fats, added sugars, or sodium. Maintain a healthy weight Body mass index (BMI) is used to identify weight problems. It estimates body fat based on height and weight. Your health care provider can help determine your BMI and help you achieve or maintain a healthy weight. Get regular exercise Get regular exercise. This is one of the most important things you can do for your health. Most adults should:  Exercise for at least 150 minutes each week. The exercise should increase your heart rate and make you sweat (moderate-intensity exercise).  Do strengthening exercises at least twice a week. This is in addition to the moderate-intensity exercise.  Spend less time sitting. Even light physical activity can be beneficial. Watch cholesterol and blood lipids Have your blood tested for lipids and cholesterol at 24 years of age, then have this test every 5 years. Have your cholesterol levels checked more often if:  Your lipid or cholesterol levels are high.  You are older than 24 years of age.  You are at high risk for heart disease. What should I know about cancer screening? Depending on your health history and family history, you may need to have cancer screening at various ages. This may include screening for:  Breast cancer.  Cervical cancer.  Colorectal cancer.  Skin cancer.  Lung cancer. What should I know about heart disease, diabetes, and high blood pressure? Blood pressure and heart disease  High blood pressure causes heart disease and increases the risk of stroke. This is more likely to  develop in people who have high blood pressure readings, are of African descent, or are overweight.  Have your blood pressure checked: ? Every 3-5 years if you are 34-9 years of age. ? Every year if you are 1 years old or older. Diabetes Have regular diabetes screenings. This checks your fasting blood sugar level. Have the screening done:  Once every three years after age 77 if you are at a normal weight and have a low risk for diabetes.  More often and at a younger age if you are overweight or have a high risk for diabetes. What should I know about preventing infection? Hepatitis B If you have a higher risk for hepatitis B, you should be screened for this virus. Talk with your health care provider to find out if you are at risk for hepatitis B infection. Hepatitis C Testing is recommended for:  Everyone born from [birth year] through 1965.  Anyone with known risk factors for hepatitis C. Sexually transmitted infections (STIs)  Get screened for STIs, including gonorrhea and chlamydia, if: ? You are sexually active and are younger than 24 years of age. ? You are older than 24 years of age and your health care provider tells you that you are at risk for this type of infection. ? Your sexual activity has changed since you were last screened, and you are at increased risk for chlamydia or gonorrhea. Ask your health care provider if you are at risk.  Ask your health care provider about whether you are at high risk for HIV. Your health care provider may recommend a prescription medicine to help prevent HIV infection. If you choose to take medicine to prevent HIV, you should first get tested for HIV. You should then be tested every 3 months for as long as you are taking the medicine. Pregnancy  If you are about to stop having your period (premenopausal) and you may become pregnant, seek counseling before you get pregnant.  Take 400 to 800  micrograms (mcg) of folic acid every day if you become  pregnant.  Ask for birth control (contraception) if you want to prevent pregnancy. Osteoporosis and menopause Osteoporosis is a disease in which the bones lose minerals and strength with aging. This can result in bone fractures. If you are 40 years old or older, or if you are at risk for osteoporosis and fractures, ask your health care provider if you should:  Be screened for bone loss.  Take a calcium or vitamin D supplement to lower your risk of fractures.  Be given hormone replacement therapy (HRT) to treat symptoms of menopause. Follow these instructions at home: Lifestyle  Do not use any products that contain nicotine or tobacco, such as cigarettes, e-cigarettes, and chewing tobacco. If you need help quitting, ask your health care provider.  Do not use street drugs.  Do not share needles.  Ask your health care provider for help if you need support or information about quitting drugs. Alcohol use  Do not drink alcohol if: ? Your health care provider tells you not to drink. ? You are pregnant, may be pregnant, or are planning to become pregnant.  If you drink alcohol: ? Limit how much you use to 0-1 drink a day. ? Limit intake if you are breastfeeding.  Be aware of how much alcohol is in your drink. In the [place] one drink equals one 12 oz bottle of beer (355 mL), one 5 oz glass of wine (148 mL), or one 1 oz glass of hard liquor (44 mL). General instructions  Schedule regular health, dental, and eye exams.  Stay current with your vaccines.  Tell your health care provider if: ? You often feel depressed. ? You have ever been abused or do not feel safe at home. Summary  Adopting a healthy lifestyle and getting preventive care are important in promoting health and wellness.  Follow your health care provider's instructions about healthy diet, exercising, and getting tested or screened for diseases.  Follow your health care provider's instructions on monitoring your  cholesterol and blood pressure. This information is not intended to replace advice given to you by your health care provider. Make sure you discuss any questions you have with your health care provider. Document Revised: 04/23/2018 Document Reviewed: 04/23/2018 Elsevier Patient Education  2020 [place] Breast self-awareness means being familiar with how your breasts look and feel. It involves checking your breasts regularly and reporting any changes to your health care provider. Practicing breast self-awareness is important. Sometimes changes may not be harmful (are benign), but sometimes a change in your breasts can be a sign of a serious medical problem. It is important to learn how to do this procedure correctly so that you can catch problems early, when treatment is more likely to be successful. All women should practice breast self-awareness, including women who have had breast implants. What you need:  A mirror.  A well-lit room. How to do a breast self-exam A breast self-exam is one way to learn what is normal for your breasts and whether your breasts are changing. To do a breast self-exam: Look for changes  1. Remove all the clothing above your waist. 2. Stand in front of a mirror in a room with good lighting. 3. Put your hands on your hips. 4. Push your hands firmly downward. 5. Compare your breasts in the mirror. Look for differences between them (asymmetry), such as: ? Differences in shape. ? Differences in size. ?  Puckers, dips, and bumps in one breast and not the other. 6. Look at each breast for changes in the skin, such as: ? Redness. ? Scaly areas. 7. Look for changes in your nipples, such as: ? Discharge. ? Bleeding. ? Dimpling. ? Redness. ? A change in position. Feel for changes Carefully feel your breasts for lumps and changes. It is best to do this while lying on your back on the floor, and again while sitting or standing in the  tub or shower with soapy water on your skin. Feel each breast in the following way: 1. Place the arm on the side of the breast you are examining above your head. 2. Feel your breast with the other hand. 3. Start in the nipple area and make -inch (2 cm) overlapping circles to feel your breast. Use the pads of your three middle fingers to do this. Apply light pressure, then medium pressure, then firm pressure. The light pressure will allow you to feel the tissue closest to the skin. The medium pressure will allow you to feel the tissue that is a little deeper. The firm pressure will allow you to feel the tissue close to the ribs. 4. Continue the overlapping circles, moving downward over the breast until you feel your ribs below your breast. 5. Move one finger-width toward the center of the body. Continue to use the -inch (2 cm) overlapping circles to feel your breast as you move slowly up toward your collarbone. 6. Continue the up-and-down exam using all three pressures until you reach your armpit.  Write down what you find Writing down what you find can help you remember what to discuss with your health care provider. Write down:  What is normal for each breast.  Any changes that you find in each breast, including: ? The kind of changes you find. ? Any pain or tenderness. ? Size and location of any lumps.  Where you are in your menstrual cycle, if you are still menstruating. General tips and recommendations  Examine your breasts every month.  If you are breastfeeding, the best time to examine your breasts is after a feeding or after using a breast pump.  If you menstruate, the best time to examine your breasts is 5-7 days after your period. Breasts are generally lumpier during menstrual periods, and it may be more difficult to notice changes.  With time and practice, you will become more familiar with the variations in your breasts and more comfortable with the exam. Contact a health  care provider if you:  See a change in the shape or size of your breasts or nipples.  See a change in the skin of your breast or nipples, such as a reddened or scaly area.  Have unusual discharge from your nipples.  Find a lump or thick area that was not there before.  Have pain in your breasts.  Have any concerns related to your breast health. Summary  Breast self-awareness includes looking for physical changes in your breasts, as well as feeling for any changes within your breasts.  Breast self-awareness should be performed in front of a mirror in a well-lit room.  You should examine your breasts every month. If you menstruate, the best time to examine your breasts is 5-7 days after your menstrual period.  Let your health care provider know of any changes you notice in your breasts, including changes in size, changes on the skin, pain or tenderness, or unusual fluid from your nipples. This information is  not intended to replace advice given to you by your health care provider. Make sure you discuss any questions you have with your health care provider. Document Revised: 12/17/2017 Document Reviewed: 12/17/2017 Elsevier Patient Education  [place].

## 2019-12-30 ENCOUNTER — Other Ambulatory Visit: Payer: Self-pay

## 2019-12-30 ENCOUNTER — Ambulatory Visit (INDEPENDENT_AMBULATORY_CARE_PROVIDER_SITE_OTHER): Payer: Managed Care, Other (non HMO) | Admitting: Obstetrics and Gynecology

## 2019-12-30 ENCOUNTER — Encounter: Payer: Self-pay | Admitting: Obstetrics and Gynecology

## 2019-12-30 VITALS — BP 122/83 | HR 96 | Ht 63.0 in | Wt 337.2 lb

## 2019-12-30 DIAGNOSIS — E559 Vitamin D deficiency, unspecified: Secondary | ICD-10-CM

## 2019-12-30 DIAGNOSIS — Z6841 Body Mass Index (BMI) 40.0 and over, adult: Secondary | ICD-10-CM

## 2019-12-30 DIAGNOSIS — Z01419 Encounter for gynecological examination (general) (routine) without abnormal findings: Secondary | ICD-10-CM

## 2019-12-30 DIAGNOSIS — E282 Polycystic ovarian syndrome: Secondary | ICD-10-CM | POA: Diagnosis not present

## 2019-12-30 DIAGNOSIS — Z113 Encounter for screening for infections with a predominantly sexual mode of transmission: Secondary | ICD-10-CM

## 2019-12-30 DIAGNOSIS — F331 Major depressive disorder, recurrent, moderate: Secondary | ICD-10-CM

## 2019-12-30 DIAGNOSIS — R7303 Prediabetes: Secondary | ICD-10-CM

## 2019-12-30 DIAGNOSIS — N970 Female infertility associated with anovulation: Secondary | ICD-10-CM

## 2019-12-30 DIAGNOSIS — F419 Anxiety disorder, unspecified: Secondary | ICD-10-CM

## 2019-12-30 DIAGNOSIS — N926 Irregular menstruation, unspecified: Secondary | ICD-10-CM

## 2019-12-30 NOTE — Progress Notes (Signed)
Pt present for annual exam and to discuss weight management. Pt stated that she is unsure about weight management and having no problems at this time. PHQ-9=19. GAD-7=16.

## 2019-12-30 NOTE — Progress Notes (Signed)
GYNECOLOGY ANNUAL PHYSICAL EXAM PROGRESS NOTE  Subjective:    Katherine Huynh is a 24 y.o. G57P0020 female who presents for an annual exam. The patient has no major complaints today. The patient is sexually active. The patient wears seatbelts: yes. The patient participates in regular exercise: yes.    Gynecologic History  Patient's last menstrual period was 10/24/2019. Period Pattern: (!) Irregular  History of STI's: Denies Last Pap: 10/2017. Results were: normal.  Denies h/o abnormal pap smears. Contraception: none.  Patient desires to conceive.     Upstream - 12/30/19 2333      Pregnancy Intention Screening   Does the patient want to become pregnant in the next year? Yes    Does the patient's partner want to become pregnant in the next year? Yes    Would the patient like to discuss contraceptive options today? No      Contraception Wrap Up   Current Method Pregnant/Seeking Pregnancy    End Method Pregnant/Seeking Pregnancy    Contraception Counseling Provided No           The pregnancy intention screening data noted above was reviewed. Potential methods of contraception were discussed. The patient elected to proceed with Pregnant/Seeking Pregnancy.    OB History  Gravida Para Term Preterm AB Living  2 0 0 0 2 0  SAB TAB Ectopic Multiple Live Births  2 0 0 0 0    # Outcome Date GA Lbr Len/2nd Weight Sex Delivery Anes PTL Lv  2 SAB           1 SAB             Past Medical History:  Diagnosis Date  . Depression   . Fam hx-ischem heart disease 09/15/2015  . Heart murmur   . Morbid obesity (HCC) 02/17/2016  . Polycystic disease, ovaries     Past Surgical History:  Procedure Laterality Date  . NO PAST SURGERIES      Family History  Problem Relation Age of Onset  . Healthy Mother   . Healthy Father   . Cancer Maternal Grandmother        breast  . Heart disease Maternal Grandfather   . Heart disease Paternal Grandfather   . Alcohol abuse Sister   . ADD /  ADHD Sister     Social History   Socioeconomic History  . Marital status: Significant Other    Spouse name: [name]   . Number of children: 0  . Years of education: Not on file  . Highest education level: High school graduate  Occupational History  . Occupation: Conservation officer, nature   Tobacco Use  . Smoking status: Former Smoker    Types: Cigarettes    Quit date: 09/28/2016    Years since quitting: 3.2  . Smokeless tobacco: Never Used  Vaping Use  . Vaping Use: Never used  Substance and Sexual Activity  . Alcohol use: Yes    Alcohol/week: 6.0 standard drinks    Types: 3 Glasses of wine, 2 Cans of beer, 1 Shots of liquor per week    Comment: occas  . Drug use: Not Currently    Types: Marijuana    Comment: last used about a month ago  . Sexual activity: Yes    Birth control/protection: None  Other Topics Concern  . Not on file  Social History Narrative   Lives with fiancee, they live together, works full time.    Social Determinants of Health   Financial Resource Strain:   .  Difficulty of Paying Living Expenses:   Food Insecurity:   . Worried About Programme researcher, broadcasting/film/video in the Last Year:   . Barista in the Last Year:   Transportation Needs:   . Freight forwarder (Medical):   Marland Kitchen Lack of Transportation (Non-Medical):   Physical Activity:   . Days of Exercise per Week:   . Minutes of Exercise per Session:   Stress:   . Feeling of Stress :   Social Connections:   . Frequency of Communication with Friends and Family:   . Frequency of Social Gatherings with Friends and Family:   . Attends Religious Services:   . Active Member of Clubs or Organizations:   . Attends Banker Meetings:   Marland Kitchen Marital Status:   Intimate Partner Violence:   . Fear of Current or Ex-Partner:   . Emotionally Abused:   Marland Kitchen Physically Abused:   . Sexually Abused:     Current Outpatient Medications on File Prior to Visit  Medication Sig Dispense Refill  . buPROPion (WELLBUTRIN XL)  150 MG 24 hr tablet Take 1 tablet (150 mg total) by mouth daily. 30 tablet 6  . cyanocobalamin (,VITAMIN B-12,) 1000 MCG/ML injection Inject 1 mL (1,000 mcg total) into the muscle every 30 (thirty) days. 1 mL 2  . medroxyPROGESTERone (PROVERA) 10 MG tablet Take 1 tablet (10 mg total) by mouth daily. Use for ten days 10 tablet 2  . nitrofurantoin, macrocrystal-monohydrate, (MACROBID) 100 MG capsule Take 1 capsule (100 mg total) by mouth 2 (two) times daily. 14 capsule 1  . propranolol (INDERAL) 10 MG tablet Take 1 tablet up to three times a day if needed for severe anxiety. 90 tablet 0  . Vitamin D, Ergocalciferol, (DRISDOL) 1.25 MG (50000 UNIT) CAPS capsule Take 1 capsule (50,000 Units total) by mouth every 7 (seven) days. 12 capsule 0   No current facility-administered medications on file prior to visit.    Allergies  Allergen Reactions  . Cinnamon Itching  . Erythromycin Other (See Comments)    Unknown reaction, happened in childhood  . Sulfa Antibiotics Rash      Review of Systems Constitutional: negative for chills, fatigue, fevers and sweats Eyes: negative for irritation, redness and visual disturbance Ears, nose, mouth, throat, and face: negative for hearing loss, nasal congestion, snoring and tinnitus Respiratory: negative for asthma, cough, sputum Cardiovascular: negative for chest pain, dyspnea, exertional chest pressure/discomfort, irregular heart beat, palpitations and syncope Gastrointestinal: negative for abdominal pain, change in bowel habits, nausea and vomiting Genitourinary: negative for abnormal menstrual periods, genital lesions, sexual problems and vaginal discharge, dysuria and urinary incontinence Integument/breast: negative for breast lump, breast tenderness and nipple discharge Hematologic/lymphatic: negative for bleeding and easy bruising Musculoskeletal:negative for back pain and muscle weakness Neurological: negative for dizziness, headaches, vertigo and  weakness Endocrine: negative for diabetic symptoms including polydipsia, polyuria and skin dryness Allergic/Immunologic: negative for hay fever and urticaria        Objective:  Blood pressure 122/83, pulse 96, height 5\' 3"  (1.6 m), weight (!) 337 lb 3.2 oz (153 kg), last menstrual period 10/24/2019. Body mass index is 59.73 kg/m.  General Appearance:    Alert, cooperative, no distress, appears stated age. Morbidly obese  Head:    Normocephalic, without obvious abnormality, atraumatic  Eyes:    PERRL, conjunctiva/corneas clear, EOM's intact, both eyes  Ears:    Normal external ear canals, both ears  Nose:   Nares normal, septum midline, mucosa normal, no  drainage or sinus tenderness  Throat:   Lips, mucosa, and tongue normal; teeth and gums normal  Neck:   Supple, symmetrical, trachea midline, no adenopathy; thyroid: no enlargement/tenderness/nodules; no carotid bruit or JVD  Back:     Symmetric, no curvature, ROM normal, no CVA tenderness  Lungs:     Clear to auscultation bilaterally, respirations unlabored  Chest Wall:    No tenderness or deformity   Heart:    Regular rate and rhythm, S1 and S2 normal, no murmur, rub or gallop  Breast Exam:    No tenderness, masses, or nipple abnormality  Abdomen:     Soft, non-tender, bowel sounds active all four quadrants, no masses, no organomegaly.    Genitalia:    Pelvic:external genitalia normal, vagina without lesions, discharge, or tenderness, rectovaginal septum  normal. Cervix normal in appearance, no cervical motion tenderness, no adnexal masses or tenderness.  Uterus normal size, shape, mobile, regular contours, nontender.  Rectal:    Normal external sphincter.  No hemorrhoids appreciated. Internal exam not done.   Extremities:   Extremities normal, atraumatic, no cyanosis or edema  Pulses:   2+ and symmetric all extremities  Skin:   Skin color, texture, turgor normal, no rashes or lesions  Lymph nodes:   Cervical, supraclavicular, and  axillary nodes normal  Neurologic:   CNII-XII intact, normal strength, sensation and reflexes throughout   .  Depression screen [facility] 2/9 12/30/2019 10/30/2019 10/30/2019 10/02/2019 02/11/2019  Decreased Interest 2 1 1 2 3   Down, Depressed, Hopeless 3 2 2 3 3   PHQ - 2 Score 5 3 3 5 6   Altered sleeping 3 2 2 3 3   Tired, decreased energy 3 1 1 3 3   Change in appetite 3 1 1 3 3   Feeling bad or failure about yourself  3 3 3 2 3   Trouble concentrating 2 1 1 1 3   Moving slowly or fidgety/restless 0 0 0 0 1  Suicidal thoughts 0 1 1 1 3   PHQ-9 Score 19 12 12 18 25   Difficult doing work/chores Extremely dIfficult Somewhat difficult Somewhat difficult Very difficult Extremely dIfficult  Some recent data might be hidden    GAD 7 : Generalized Anxiety Score 12/30/2019 02/11/2019 07/18/2018 05/30/2018  Nervous, Anxious, on Edge 2 3 2 3   Control/stop worrying 3 3 1 3   Worry too much - different things 3 3 3 3   Trouble relaxing 2 3 3 3   Restless 0 3 3 3   Easily annoyed or irritable 3 3 3 3   Afraid - awful might happen 3 3 2 3   Total GAD 7 Score 16 21 17 21   Anxiety Difficulty Extremely difficult Extremely difficult Very difficult Extremely difficult      Labs:  Lab Results  Component Value Date   WBC 6.3 09/11/2019   HGB 13.0 09/11/2019   HCT 40.2 09/11/2019   MCV 84 09/11/2019   PLT 328 09/11/2019    Lab Results  Component Value Date   CREATININE 0.89 09/11/2019   BUN 9 09/11/2019   NA 140 09/11/2019   K 4.8 09/11/2019   CL 104 09/11/2019   CO2 22 09/11/2019    Lab Results  Component Value Date   ALT 15 09/11/2019   AST 15 09/11/2019   ALKPHOS 106 09/11/2019   BILITOT 0.2 09/11/2019    Lab Results  Component Value Date   TSH 2.370 10/21/2018     Assessment:   1. Encounter for well woman exam with routine gynecological exam  2. PCO (polycystic ovaries)   3. Body mass index (BMI) 50.0-59.9, adult (HCC)   4. Vitamin D deficiency   5. Screen for STD (sexually transmitted  disease)   6. Primary anovulatory infertility   7. Moderate episode of recurrent major depressive disorder (HCC)   8. Prediabetes   9. Anxiety     Plan:    - Blood tests: Lipoproteins, HgbA1c, TSH and Vitamin D. - Breast self exam technique reviewed and patient encouraged to perform self-exam monthly. Contraception: none. - Discussed healthy lifestyle modifications. - Pap smear up to date.  - Currently undergoing workup for infertility. Plans for HSG in a few weeks.  - PCOS, patient recently discontinued her Metformin. Is trying to manage with diet and exercise.  Is doing another round of Provera to induce a cycle. Is trying to manage with diet and exercise.  - Pre-diabetes, patient recently discontinued her Metformin.  - Morbid obesity, was utilizing Phentermine (and Wellbutrin off-label) for weight loss. Discontinued Phentermine as she notes it was keeping her awake during hours she needed to sleep. Will look into other options for patient with regards to maintaining weight loss.  - Depression and anxiety, currently on Wellbutrin.  Anxiety appears to be improving since last visit, but depression has worsened.  Should consider increasing Wellbutrin dosing if patient ok to do so. Most of her concerns are related to her infertility.  - COVID vaccination status: declines vaccination.    Hildred Laserherry, Prabhnoor Ellenberger, MD [facility]

## 2019-12-31 ENCOUNTER — Other Ambulatory Visit: Payer: Self-pay | Admitting: Obstetrics and Gynecology

## 2019-12-31 LAB — CBC
Hematocrit: 38.1 % (ref 34.0–46.6)
Hemoglobin: 12.4 g/dL (ref 11.1–15.9)
MCH: 27 pg (ref 26.6–33.0)
MCHC: 32.5 g/dL (ref 31.5–35.7)
MCV: 83 fL (ref 79–97)
Platelets: 310 10*3/uL (ref 150–450)
RBC: 4.59 x10E6/uL (ref 3.77–5.28)
RDW: 14.7 % (ref 11.7–15.4)
WBC: 9.4 10*3/uL (ref 3.4–10.8)

## 2019-12-31 LAB — LIPID PANEL
Chol/HDL Ratio: 4.1 ratio (ref 0.0–4.4)
Cholesterol, Total: 162 mg/dL (ref 100–199)
HDL: 40 mg/dL (ref >39)
LDL Chol Calc (NIH): 111 mg/dL — ABNORMAL HIGH (ref 0–99)
Triglycerides: 56 mg/dL (ref 0–149)
VLDL Cholesterol Cal: 11 mg/dL (ref 5–40)

## 2019-12-31 LAB — COMPREHENSIVE METABOLIC PANEL
ALT: 18 IU/L (ref 0–32)
AST: 13 IU/L (ref 0–40)
Albumin/Globulin Ratio: 1.2 (ref 1.2–2.2)
Albumin: 4.1 g/dL (ref 3.9–5.0)
Alkaline Phosphatase: 98 IU/L (ref 48–121)
BUN/Creatinine Ratio: 12 (ref 9–23)
BUN: 10 mg/dL (ref 6–20)
Bilirubin Total: 0.2 mg/dL (ref 0.0–1.2)
CO2: 20 mmol/L (ref 20–29)
Calcium: 9.2 mg/dL (ref 8.7–10.2)
Chloride: 104 mmol/L (ref 96–106)
Creatinine, Ser: 0.85 mg/dL (ref 0.57–1.00)
GFR calc Af Amer: 111 mL/min/{1.73_m2} (ref >59)
GFR calc non Af Amer: 96 mL/min/{1.73_m2} (ref >59)
Globulin, Total: 3.3 g/dL (ref 1.5–4.5)
Glucose: 83 mg/dL (ref 65–99)
Potassium: 4.4 mmol/L (ref 3.5–5.2)
Sodium: 139 mmol/L (ref 134–144)
Total Protein: 7.4 g/dL (ref 6.0–8.5)

## 2019-12-31 LAB — TSH: TSH: 3.89 u[IU]/mL (ref 0.450–4.500)

## 2019-12-31 LAB — HEMOGLOBIN A1C
Est. average glucose Bld gHb Est-mCnc: 114 mg/dL
Hgb A1c MFr Bld: 5.6 % (ref 4.8–5.6)

## 2019-12-31 LAB — VITAMIN D 25 HYDROXY (VIT D DEFICIENCY, FRACTURES): Vit D, 25-Hydroxy: 15.1 ng/mL — ABNORMAL LOW (ref 30.0–100.0)

## 2019-12-31 MED ORDER — VITAMIN D (ERGOCALCIFEROL) 1.25 MG (50000 UNIT) PO CAPS
50000.0000 [IU] | ORAL_CAPSULE | ORAL | 0 refills | Status: DC
Start: 2019-12-31 — End: 2020-06-23

## 2020-01-04 LAB — SPECIMEN STATUS REPORT

## 2020-01-04 LAB — RPR: RPR Ser Ql: NONREACTIVE

## 2020-01-08 NOTE — Addendum Note (Signed)
Addended by: Silvano Bilis on: 01/08/2020 05:10 PM   Modules accepted: Orders

## 2020-01-11 ENCOUNTER — Other Ambulatory Visit: Payer: Self-pay

## 2020-01-11 ENCOUNTER — Other Ambulatory Visit: Payer: Managed Care, Other (non HMO)

## 2020-01-11 DIAGNOSIS — N926 Irregular menstruation, unspecified: Secondary | ICD-10-CM

## 2020-01-12 LAB — BETA HCG QUANT (REF LAB): hCG Quant: <1 m[IU]/mL

## 2020-01-13 ENCOUNTER — Ambulatory Visit
Admission: RE | Admit: 2020-01-13 | Discharge: 2020-01-13 | Disposition: A | Discharge status: Home / Self Care | Payer: Managed Care, Other (non HMO) | Source: Ambulatory Visit | Attending: Obstetrics and Gynecology | Admitting: Obstetrics and Gynecology

## 2020-01-13 ENCOUNTER — Other Ambulatory Visit: Payer: Self-pay

## 2020-01-13 DIAGNOSIS — Z6841 Body Mass Index (BMI) 40.0 and over, adult: Secondary | ICD-10-CM

## 2020-01-13 DIAGNOSIS — N979 Female infertility, unspecified: Secondary | ICD-10-CM

## 2020-01-13 DIAGNOSIS — E282 Polycystic ovarian syndrome: Secondary | ICD-10-CM | POA: Diagnosis not present

## 2020-01-13 DIAGNOSIS — N970 Female infertility associated with anovulation: Secondary | ICD-10-CM | POA: Insufficient documentation

## 2020-01-13 HISTORY — PX: DG HYSTEROGRAM (HSD): IMG5293

## 2020-01-13 MED ORDER — IOHEXOL 300 MG/ML  SOLN
13.0000 mL | Freq: Once | INTRAMUSCULAR | Status: AC | PRN
  Administered 2020-01-13: 13 mL

## 2020-01-13 NOTE — Procedures (Signed)
Hysterosalpingogram Post-Procedure Note  Pre-procedure Diagnosis: Infertility, female; PCOS, morbid obesity  Post-operative Diagnosis: Same   Indications: Infertility   Procedure Details:   Consent: Informed consent was obtained. Risks of the procedure were discussed including: infection, bleeding, pain, and allergic reaction to dye.  A speculum was inserted into the patient's vagina.  The cervix was cleansed with Betadine and a single-toothed tenaculum was used to grasp the anterior lip of the cervix.  The cervical opening was cannulated per standard procedure.  Then under fluoroscopic guidance, water soluble contrast was injected in a retrograde fashion. Spillage was noted from bilateral fallopian tubes after approximately 3 seconds.  X-ray imaging captured per the Radiologist. The cannula was then removed from the uterine cavity.  The tenaculum was removed from the cervix with good hemostasis noted, and the speculum was removed.  The patient tolerated the procedure well.    Findings:   The uterus fills normally, with no evidence of contour abnormality, filling defect, septum, mass, or bicornuate configuration.   Bilateral fallopian tubes are normal and patent with normal rapid spillage of contrast into the peritoneum.    Fluoroscopy time: 30 seconds.   Complications: None     Condition: Stable  Plan: Patient to call clinic to schedule follow up appointment.     Hildred Laser, MD [facility]

## 2020-02-23 ENCOUNTER — Other Ambulatory Visit: Payer: Self-pay

## 2020-02-23 ENCOUNTER — Ambulatory Visit: Payer: Managed Care, Other (non HMO) | Admitting: Obstetrics and Gynecology

## 2020-02-23 ENCOUNTER — Encounter: Payer: Self-pay | Admitting: Obstetrics and Gynecology

## 2020-02-23 VITALS — BP 126/83 | HR 96 | Ht 63.0 in | Wt 337.1 lb

## 2020-02-23 DIAGNOSIS — N926 Irregular menstruation, unspecified: Secondary | ICD-10-CM

## 2020-02-23 DIAGNOSIS — Z6841 Body Mass Index (BMI) 40.0 and over, adult: Secondary | ICD-10-CM | POA: Diagnosis not present

## 2020-02-23 DIAGNOSIS — E282 Polycystic ovarian syndrome: Secondary | ICD-10-CM | POA: Diagnosis not present

## 2020-02-23 DIAGNOSIS — Z789 Other specified health status: Secondary | ICD-10-CM | POA: Diagnosis not present

## 2020-02-23 LAB — POCT URINE PREGNANCY: Preg Test, Ur: NEGATIVE

## 2020-02-23 NOTE — Progress Notes (Signed)
    GYNECOLOGY PROGRESS NOTE  Subjective:    Patient ID: Katherine Huynh, female    DOB: 09-18-1995, 24 y.o.   MRN: [id number]  HPI  Patient is a 24 y.o. G55P0020 female who presents for complaints of missed cycles. Last cycle was in August 2021.  She does have a history of PCOS, was using Provera for withdrawal bleeds as she desired to regulate her cycles in order to conceive.  She also reports taking ~ 7 home pregnancy tests, with the initial tests being positive but subsequent tests being negative. She is experiencing what she feels like are "pregnancy symptoms". Including nausea, cravings, increased appetite.   Katherine Huynh also desires to further discuss weight loss management.  She had a trial of Phentermine, where she initially began losing weight, but then discontinued the medication. She would like to discuss other options besides Phentermine. Notes she is still exercising regularly and trying to eat healthy (although on further discussion appears that she may not be eating enough as she eats an average of 1-2 meals per day).   The following portions of the patient's history were reviewed and updated as appropriate: allergies, current medications, past family history, past medical history, past social history, past surgical history and problem list.  Review of Systems Pertinent items noted in HPI and remainder of comprehensive ROS otherwise negative.   Objective:   Blood pressure 126/83, pulse 96, height 5\' 3"  (1.6 m), weight (!) 337 lb 1.6 oz (152.9 kg), last menstrual period 01/06/2020. Body mass index is 59.71 kg/m.  General appearance: alert and no distress Remainder of exam deferred.    Assessment/Plan:   1. Missed menses - Plan: POCT urine pregnancy negative, will order HCG, Tumor Marker in light of both positive and negative tests.   2. Advised about management of weight - Plan: Amb Ref to Medical Weight Management. Also discussed other medical options that could be used including  Saxenda, Contrave, which are covered by patient's insurance. Given handout on options.   3. Body mass index (BMI) 50.0-59.9, adult (HCC) - Plan: Amb Ref to Medical Weight Management  4. PCO (polycystic ovaries) - advised that missed menses is likely due to her h/o PCOS.  If pregnancy is not confirmed, advised that she needs to resume her Provera, as she has not taken in the past several months.    09-09-1994, MD [facility]

## 2020-02-23 NOTE — Progress Notes (Signed)
Pt present due to having missed cycles. Pt stated that her last cycle was in August 2021. Pt upt negative.

## 2020-02-24 LAB — BETA HCG QUANT (REF LAB): hCG Quant: <1 m[IU]/mL

## 2020-03-31 ENCOUNTER — Encounter: Payer: Managed Care, Other (non HMO) | Admitting: Obstetrics and Gynecology

## 2020-04-19 NOTE — Progress Notes (Deleted)
Patient is a 24 year old female former patient of Maurice Small Last visit at [facility] was in September 2020 for depression/anxiety concerns Follows up today with the above complaints.  She was seen for a well woman's exam by OB/GYN in August 2021. Communication with her from the lab results at that visit included the following:  Your labs show the following:  1. Your Vitamin D levels have improved, but are still below normal range. I recommend doing an addition 3 months of the weekly high dose Vitamin D to continue to improve your levels, as deficiency can lead to low mood, decreased appetite, fatigue, poor muscle strength, and frequent infections.   2. Your HgbA1c (marker of prediabetes and diabetes), has improved and is back to normal. This may be due to your healthier diet and exercising, but also using the Metformin can keep your levels at a normal range as well.   3. Your LDL cholesterol ("bad") cholesterol is still mildly elevated, but does not warrant medications. Continue to modify diet and exercise.   All of your other labs are normal.   Dr. Valentino Saxon  The assessment/plan from that visit was as follows: 1. Encounter for well woman exam with routine gynecological exam   2. PCO (polycystic ovaries)   3. Body mass index (BMI) 50.0-59.9, adult (HCC)   4. Vitamin D deficiency   5. Screen for STD (sexually transmitted disease)   6. Primary anovulatory infertility   7. Moderate episode of recurrent major depressive disorder (HCC)   8. Prediabetes   9. Anxiety     Plan:    - Blood tests: Lipoproteins, HgbA1c, TSH and Vitamin D. - Breast self exam technique reviewed and patient encouraged to perform self-exam monthly. Contraception: none. - Discussed healthy lifestyle modifications. - Pap smear up to date.  - Currently undergoing workup for infertility. Plans for HSG in a few weeks.  - PCOS, patient recently discontinued her Metformin. Is trying to manage with diet and exercise.  Is  doing another round of Provera to induce a cycle. Is trying to manage with diet and exercise.  - Pre-diabetes, patient recently discontinued her Metformin.  - Morbid obesity, was utilizing Phentermine (and Wellbutrin off-label) for weight loss. Discontinued Phentermine as she notes it was keeping her awake during hours she needed to sleep. Will look into other options for patient with regards to maintaining weight loss.  - Depression and anxiety, currently on Wellbutrin.  Anxiety appears to be improving since last visit, but depression has worsened.  Should consider increasing Wellbutrin dosing if patient ok to do so. Most of her concerns are related to her infertility.  - COVID vaccination status: declines vaccination.   Lab Results  Component Value Date   HGBA1C 5.6 12/30/2019   HGBA1C 5.7 (H) 09/11/2019   HGBA1C 5.5 10/18/2017   Lab Results  Component Value Date   LDLCALC 111 (H) 12/30/2019   CREATININE 0.85 12/30/2019

## 2020-04-20 ENCOUNTER — Ambulatory Visit: Payer: Managed Care, Other (non HMO) | Admitting: Internal Medicine

## 2020-06-05 ENCOUNTER — Encounter (INDEPENDENT_AMBULATORY_CARE_PROVIDER_SITE_OTHER): Payer: Self-pay

## 2020-06-09 ENCOUNTER — Telehealth: Payer: Self-pay

## 2020-06-09 NOTE — Telephone Encounter (Signed)
Spoke to pt concerning her bleeding. Pt was suggested to try ibuprofen.  Pt voiced that she has been and it is not helping. Please advise on what else could be done. No opens tomorrow. Geraldo Pitter

## 2020-06-09 NOTE — Telephone Encounter (Signed)
Patient called in stating that she is filling a super plus tampon every hour. Patient states that there is heavy clotting as well. Patient is concerned and would like to know whether she needs to go to the hospital or not. Informed patient that I wouldn't be able to give her medical advise but if she felt like she needed to go to the hospital she was more than welcome to. Informed her that I would send a message back to her providers nurse and to allow 24-72 hours to get back in touch. Patient verbalized understanding.

## 2020-06-10 ENCOUNTER — Telehealth: Payer: Self-pay

## 2020-06-10 NOTE — Telephone Encounter (Signed)
Please attempt to contact patient again to see if she can come in today.

## 2020-06-10 NOTE — Telephone Encounter (Signed)
Called patient x2 to see if she would like to be added to Dr. Oretha Milch schedule for today. Call went straight to voicemail both times, unable to leave patient a VM as her mailbox was full.

## 2020-06-10 NOTE — Telephone Encounter (Signed)
Called pt couldn't leave a message no voice mail.

## 2020-06-10 NOTE — Telephone Encounter (Signed)
Ok. Thanks!

## 2020-06-10 NOTE — Telephone Encounter (Signed)
Katherine Huynh and myself have tried calling her several times and sent her a mychart message. We have not heard from her yet. Geraldo Pitter

## 2020-06-10 NOTE — Telephone Encounter (Signed)
Will you please try to contact this pt she stated that she tried to call back and got no answer. Pt is having heavy bleeding and dr. Valentino Saxon requested for her to be seen today.

## 2020-06-23 ENCOUNTER — Encounter (INDEPENDENT_AMBULATORY_CARE_PROVIDER_SITE_OTHER): Payer: Self-pay | Admitting: Family Medicine

## 2020-06-23 ENCOUNTER — Ambulatory Visit (INDEPENDENT_AMBULATORY_CARE_PROVIDER_SITE_OTHER): Payer: Managed Care, Other (non HMO) | Admitting: Family Medicine

## 2020-06-23 ENCOUNTER — Other Ambulatory Visit: Payer: Self-pay

## 2020-06-23 VITALS — BP 129/85 | HR 94 | Temp 98.1°F | Ht 63.0 in | Wt 339.0 lb

## 2020-06-23 DIAGNOSIS — R7303 Prediabetes: Secondary | ICD-10-CM | POA: Diagnosis not present

## 2020-06-23 DIAGNOSIS — Z1331 Encounter for screening for depression: Secondary | ICD-10-CM

## 2020-06-23 DIAGNOSIS — R5383 Other fatigue: Secondary | ICD-10-CM | POA: Diagnosis not present

## 2020-06-23 DIAGNOSIS — Z0289 Encounter for other administrative examinations: Secondary | ICD-10-CM

## 2020-06-23 DIAGNOSIS — Z9189 Other specified personal risk factors, not elsewhere classified: Secondary | ICD-10-CM

## 2020-06-23 DIAGNOSIS — E119 Type 2 diabetes mellitus without complications: Secondary | ICD-10-CM | POA: Insufficient documentation

## 2020-06-23 DIAGNOSIS — Z6837 Body mass index (BMI) 37.0-37.9, adult: Secondary | ICD-10-CM

## 2020-06-23 DIAGNOSIS — E7800 Pure hypercholesterolemia, unspecified: Secondary | ICD-10-CM | POA: Insufficient documentation

## 2020-06-23 DIAGNOSIS — R0602 Shortness of breath: Secondary | ICD-10-CM

## 2020-06-23 DIAGNOSIS — E559 Vitamin D deficiency, unspecified: Secondary | ICD-10-CM

## 2020-06-23 DIAGNOSIS — I10 Essential (primary) hypertension: Secondary | ICD-10-CM | POA: Insufficient documentation

## 2020-06-23 DIAGNOSIS — E282 Polycystic ovarian syndrome: Secondary | ICD-10-CM

## 2020-06-24 LAB — CBC WITH DIFFERENTIAL/PLATELET
Basophils Absolute: 0 10*3/uL (ref 0.0–0.2)
Basos: 1 %
EOS (ABSOLUTE): 0.5 10*3/uL — ABNORMAL HIGH (ref 0.0–0.4)
Eos: 5 %
Hematocrit: 37.5 % (ref 34.0–46.6)
Hemoglobin: 12.2 g/dL (ref 11.1–15.9)
Immature Grans (Abs): 0 10*3/uL (ref 0.0–0.1)
Immature Granulocytes: 0 %
Lymphocytes Absolute: 3 10*3/uL (ref 0.7–3.1)
Lymphs: 35 %
MCH: 27.9 pg (ref 26.6–33.0)
MCHC: 32.5 g/dL (ref 31.5–35.7)
MCV: 86 fL (ref 79–97)
Monocytes Absolute: 0.5 10*3/uL (ref 0.1–0.9)
Monocytes: 6 %
Neutrophils Absolute: 4.6 10*3/uL (ref 1.4–7.0)
Neutrophils: 53 %
Platelets: 321 10*3/uL (ref 150–450)
RBC: 4.38 x10E6/uL (ref 3.77–5.28)
RDW: 14.2 % (ref 11.7–15.4)
WBC: 8.6 10*3/uL (ref 3.4–10.8)

## 2020-06-24 LAB — LIPID PANEL WITH LDL/HDL RATIO
Cholesterol, Total: 169 mg/dL (ref 100–199)
HDL: 43 mg/dL (ref >39)
LDL Chol Calc (NIH): 109 mg/dL — ABNORMAL HIGH (ref 0–99)
LDL/HDL Ratio: 2.5 ratio (ref 0.0–3.2)
Triglycerides: 92 mg/dL (ref 0–149)
VLDL Cholesterol Cal: 17 mg/dL (ref 5–40)

## 2020-06-24 LAB — COMPREHENSIVE METABOLIC PANEL
ALT: 16 IU/L (ref 0–32)
AST: 16 IU/L (ref 0–40)
Albumin/Globulin Ratio: 1.3 (ref 1.2–2.2)
Albumin: 4.1 g/dL (ref 3.9–5.0)
Alkaline Phosphatase: 95 IU/L (ref 44–121)
BUN/Creatinine Ratio: 12 (ref 9–23)
BUN: 11 mg/dL (ref 6–20)
Bilirubin Total: 0.2 mg/dL (ref 0.0–1.2)
CO2: 21 mmol/L (ref 20–29)
Calcium: 9 mg/dL (ref 8.7–10.2)
Chloride: 104 mmol/L (ref 96–106)
Creatinine, Ser: 0.91 mg/dL (ref 0.57–1.00)
GFR calc Af Amer: 101 mL/min/{1.73_m2} (ref >59)
GFR calc non Af Amer: 88 mL/min/{1.73_m2} (ref >59)
Globulin, Total: 3.1 g/dL (ref 1.5–4.5)
Glucose: 89 mg/dL (ref 65–99)
Potassium: 4.2 mmol/L (ref 3.5–5.2)
Sodium: 139 mmol/L (ref 134–144)
Total Protein: 7.2 g/dL (ref 6.0–8.5)

## 2020-06-24 LAB — INSULIN, RANDOM: INSULIN: 45.1 u[IU]/mL — ABNORMAL HIGH (ref 2.6–24.9)

## 2020-06-24 LAB — TSH: TSH: 2.97 u[IU]/mL (ref 0.450–4.500)

## 2020-06-24 LAB — HEMOGLOBIN A1C
Est. average glucose Bld gHb Est-mCnc: 111 mg/dL
Hgb A1c MFr Bld: 5.5 % (ref 4.8–5.6)

## 2020-06-24 LAB — FOLATE: Folate: 9.4 ng/mL (ref >3.0)

## 2020-06-24 LAB — T3: T3, Total: 126 ng/dL (ref 71–180)

## 2020-06-24 LAB — VITAMIN D 25 HYDROXY (VIT D DEFICIENCY, FRACTURES): Vit D, 25-Hydroxy: 15 ng/mL — ABNORMAL LOW (ref 30.0–100.0)

## 2020-06-24 LAB — T4: T4, Total: 7.2 ug/dL (ref 4.5–12.0)

## 2020-06-24 LAB — VITAMIN B12: Vitamin B-12: 490 pg/mL (ref 232–1245)

## 2020-06-27 NOTE — Progress Notes (Signed)
Chief Complaint:   OBESITY Katherine Huynh (MR# [id number]) is a 25 y.o. female who presents for evaluation and treatment of obesity and related comorbidities. Current BMI is Body mass index is 60.05 kg/m. Katherine Huynh has been struggling with her weight for many years and has been unsuccessful in either losing weight, maintaining weight loss, or reaching her healthy weight goal.  Katherine Huynh suspects she may have had anorexia when she was younger, and she feels she has done some binge eating in the past but nothing diagnosed by a physician.  Katherine Huynh is currently in the action stage of change and ready to dedicate time achieving and maintaining a healthier weight. Katherine Huynh is interested in becoming our patient and working on intensive lifestyle modifications including (but not limited to) diet and exercise for weight loss.  Katherine Huynh's habits were reviewed today and are as follows: Her family eats meals together, she thinks her family will eat healthier with her, her desired weight loss is 159 lbs, she started gaining weight in 2019, her heaviest weight ever was 342 pounds, she has significant food cravings issues, she snacks frequently in the evenings, she skips meals frequently, she is frequently drinking liquids with calories, she frequently makes poor food choices, she has problems with excessive hunger, she frequently eats larger portions than normal, she has binge eating behaviors and she struggles with emotional eating.  Depression Screen Katherine Huynh's Food and Mood (modified PHQ-9) score was 15.  Depression screen [facility] 2/9 06/23/2020  Decreased Interest 3  Down, Depressed, Hopeless 3  PHQ - 2 Score 6  Altered sleeping 2  Tired, decreased energy 3  Change in appetite 1  Feeling bad or failure about yourself  1  Trouble concentrating 1  Moving slowly or fidgety/restless 1  Suicidal thoughts 0  PHQ-9 Score 15  Difficult doing work/chores Somewhat difficult  Some recent data might be hidden    Subjective:   1. Other fatigue Katherine Huynh admits to daytime somnolence and admits to waking up still tired. Patent has a history of symptoms of daytime fatigue. Katherine Huynh generally gets 5 or 8 hours of sleep per night, and states that she has difficulty falling asleep. Snoring is present. Apneic episodes are not present. Epworth Sleepiness Score is 6.  2. Shortness of breath on exertion Katherine Huynh notes increasing shortness of breath with exercising and seems to be worsening over time with weight gain. She notes getting out of breath sooner with activity than she used to. This has not gotten worse recently. Katherine Huynh denies shortness of breath at rest or orthopnea.  3. Pre-diabetes Katherine Huynh has a history of pre-diabetes, and she is working on diet and weight loss to help prevent diabetes mellitus. She is not on metformin currently.  4. Vitamin D deficiency Katherine Huynh is not on Vit D currently, and she has no recent labs. She notes fatigue.  5. PCOS (polycystic ovarian syndrome) Katherine Huynh is currently not on birth control, and she is wanting to start a family.  6. At risk for heart disease Katherine Huynh is at a higher than average risk for cardiovascular disease due to obesity.   Assessment/Plan:   1. Other fatigue Katherine Huynh does feel that her weight is causing her energy to be lower than it should be. Fatigue may be related to obesity, depression or many other causes. Labs will be ordered, and in the meanwhile, Katherine Huynh will focus on self care including making healthy food choices, increasing physical activity and focusing on stress reduction.  - Vitamin B12 - CBC  with Differential/Platelet - EKG 12-Lead - Folate - Lipid Panel With LDL/HDL Ratio - T3 - T4 - TSH  2. Shortness of breath on exertion Katherine Huynh does feel that she gets out of breath more easily that she used to when she exercises. Katherine Huynh's shortness of breath appears to be obesity related and exercise induced. She has agreed to work on weight loss and  gradually increase exercise to treat her exercise induced shortness of breath. Will continue to monitor closely.  3. Pre-diabetes Katherine Huynh will start her Category 3 plan, and will work on weight loss, exercise, and decreasing simple carbohydrates to help decrease the risk of diabetes. We will check labs today.  - Comprehensive metabolic panel - Hemoglobin A1c - Insulin, random  4. Vitamin D deficiency Low Vitamin D level contributes to fatigue and are associated with obesity, breast, and colon cancer. We will check labs today. Katherine Huynh will follow-up for routine testing of Vitamin D, at least 2-3 times per year to avoid over-replacement.  - VITAMIN D 25 Hydroxy (Vit-D Deficiency, Fractures)  5. PCOS (polycystic ovarian syndrome) Intensive lifestyle modifications are first line treatment for this issue. We discussed several lifestyle modifications today. We will check labs today. Katherine Huynh was educated on weight loss to help improve polycystic ovarian syndrome and fertility. She will continue to work on diet, exercise and weight loss efforts. Orders and follow up as documented in patient record.  - Hemoglobin A1c  6. Screening for depression Katherine Huynh had a positive depression screening. Depression is commonly associated with obesity and often results in emotional eating behaviors. We will monitor this closely and work on CBT to help improve the non-hunger eating patterns. Referral to Psychology may be required if no improvement is seen as she continues in our clinic.  7. At risk for heart disease Katherine Huynh was given approximately 30 minutes of coronary artery disease prevention counseling today. She is 25 y.o. female and has risk factors for heart disease including obesity. We discussed intensive lifestyle modifications today with an emphasis on specific weight loss instructions and strategies.   Repetitive spaced learning was employed today to elicit superior memory formation and behavioral  change.  8. Class 2 severe obesity with serious comorbidity and body mass index (BMI) of 37.0 to 37.9 in adult, unspecified obesity type (HCC) Katherine Huynh is currently in the action stage of change and her goal is to continue with weight loss efforts. I recommend Katherine Huynh begin the structured treatment plan as follows:  She has agreed to the Category 3 Plan.  Exercise goals: No exercise has been prescribed for now, while we concentrate on nutritional changes.  Behavioral modification strategies: decreasing eating out and no skipping meals.  She was informed of the importance of frequent follow-up visits to maximize her success with intensive lifestyle modifications for her multiple health conditions. She was informed we would discuss her lab results at her next visit unless there is a critical issue that needs to be addressed sooner. Katherine Huynh agreed to keep her next visit at the agreed upon time to discuss these results.  Objective:   Blood pressure 129/85, pulse 94, temperature 98.1 F (36.7 C), height 5\' 3"  (1.6 m), weight (!) 339 lb (153.8 kg), SpO2 97 %. Body mass index is 60.05 kg/m.  EKG: Normal sinus rhythm, rate 94 BPM.  Indirect Calorimeter completed today shows a VO2 of 426 and a REE of 2965.  Her calculated basal metabolic rate is 2966 thus her basal metabolic rate is better than expected.  General: Cooperative,  alert, well developed, in no acute distress. HEENT: Conjunctivae and lids unremarkable. Cardiovascular: Regular rhythm.  Lungs: Normal work of breathing. Neurologic: No focal deficits.   Lab Results  Component Value Date   CREATININE 0.91 06/23/2020   BUN 11 06/23/2020   NA 139 06/23/2020   K 4.2 06/23/2020   CL 104 06/23/2020   CO2 21 06/23/2020   Lab Results  Component Value Date   ALT 16 06/23/2020   AST 16 06/23/2020   ALKPHOS 95 06/23/2020   BILITOT 0.2 06/23/2020   Lab Results  Component Value Date   HGBA1C 5.5 06/23/2020   HGBA1C 5.6 12/30/2019    HGBA1C 5.7 (H) 09/11/2019   HGBA1C 5.5 10/18/2017   HGBA1C 5.5 06/20/2016   Lab Results  Component Value Date   INSULIN 45.1 (H) 06/23/2020   INSULIN 47.9 (H) 10/21/2018   INSULIN 185.0 (H) 10/18/2017   INSULIN 24.1 06/20/2016   Lab Results  Component Value Date   TSH 2.970 06/23/2020   Lab Results  Component Value Date   CHOL 169 06/23/2020   HDL 43 06/23/2020   LDLCALC 109 (H) 06/23/2020   TRIG 92 06/23/2020   CHOLHDL 4.1 12/30/2019   Lab Results  Component Value Date   WBC 8.6 06/23/2020   HGB 12.2 06/23/2020   HCT 37.5 06/23/2020   MCV 86 06/23/2020   PLT 321 06/23/2020   Lab Results  Component Value Date   IRON 62 05/13/2018   FERRITIN 43 02/17/2016   Attestation Statements:   Reviewed by clinician on day of visit: allergies, medications, problem list, medical history, surgical history, family history, social history, and previous encounter notes.   I, Burt Knack, am acting as transcriptionist for Quillian Quince, MD.  I have reviewed the above documentation for accuracy and completeness, and I agree with the above. - Quillian Quince, MD

## 2020-07-07 ENCOUNTER — Ambulatory Visit (INDEPENDENT_AMBULATORY_CARE_PROVIDER_SITE_OTHER): Payer: Managed Care, Other (non HMO) | Admitting: Family Medicine

## 2020-07-07 ENCOUNTER — Other Ambulatory Visit: Payer: Self-pay

## 2020-07-07 ENCOUNTER — Encounter (INDEPENDENT_AMBULATORY_CARE_PROVIDER_SITE_OTHER): Payer: Self-pay | Admitting: Family Medicine

## 2020-07-07 VITALS — BP 127/82 | HR 89 | Temp 98.5°F | Ht 63.0 in | Wt 330.0 lb

## 2020-07-07 DIAGNOSIS — E7849 Other hyperlipidemia: Secondary | ICD-10-CM

## 2020-07-07 DIAGNOSIS — E88819 Insulin resistance, unspecified: Secondary | ICD-10-CM

## 2020-07-07 DIAGNOSIS — E559 Vitamin D deficiency, unspecified: Secondary | ICD-10-CM

## 2020-07-07 DIAGNOSIS — Z9189 Other specified personal risk factors, not elsewhere classified: Secondary | ICD-10-CM | POA: Diagnosis not present

## 2020-07-07 DIAGNOSIS — E7800 Pure hypercholesterolemia, unspecified: Secondary | ICD-10-CM

## 2020-07-07 DIAGNOSIS — E8881 Metabolic syndrome: Secondary | ICD-10-CM | POA: Diagnosis not present

## 2020-07-07 DIAGNOSIS — Z6841 Body Mass Index (BMI) 40.0 and over, adult: Secondary | ICD-10-CM

## 2020-07-07 MED ORDER — VITAMIN D (ERGOCALCIFEROL) 1.25 MG (50000 UNIT) PO CAPS: 50000.0000 [IU] | ORAL_CAPSULE | ORAL | 0 refills | Status: DC

## 2020-07-07 MED ORDER — METFORMIN HCL 500 MG PO TABS: 500.0000 mg | ORAL_TABLET | Freq: Every day | ORAL | 0 refills | Status: DC

## 2020-07-11 NOTE — Progress Notes (Signed)
Chief Complaint:   OBESITY Katherine Huynh is here to discuss her progress with her obesity treatment plan along with follow-up of her obesity related diagnoses. Katherine Huynh is on the Category 3 Plan and states she is following her eating plan approximately 50% of the time. Katherine Huynh states she is walking for 15-30 minutes 3-4 times per week.  Today's visit was #: 2 Starting weight: 339 lbs Starting date: 06/23/2020 Today's weight: 330 lbs Today's date: 07/07/2020 Total lbs lost to date: 9 Total lbs lost since last in-office visit: 9  Interim History: Katherine Huynh has done very well with weight loss. She is getting a bit bored with her options and she noticed some issues with hunger.  Subjective:   1. Hyperlipidemia, pure Katherine Huynh's LDL is mildly elevated. She is not on statin, and she is working on diet and weight loss. I discussed labs with the patient today.  2. Vitamin D deficiency Katherine Huynh's vit D level is below goal, and she notes fatigue. I discussed labs with the patient today.  3. Insulin resistance Katherine Huynh's A1c and glucose are within normal limits, but her fasting insulin is elevated. She is trying to get pregnant and she has been on metformin in the past. I discussed labs with the patient today.  4. At risk for diabetes mellitus Katherine Huynh is at higher than average risk for developing diabetes due to obesity.   Assessment/Plan:   1. Hyperlipidemia, pure Cardiovascular risk and specific lipid/LDL goals reviewed. We discussed several lifestyle modifications today. Katherine Huynh will continue to work on diet, exercise and weight loss efforts. Orders and follow up as documented in patient record.   2. Vitamin D deficiency Low Vitamin D level contributes to fatigue and are associated with obesity, breast, and colon cancer. Katherine Huynh agreed to start prescription Vitamin D 50,000 IU every week with no refills. She will follow-up for routine testing of Vitamin D, at least 2-3 times per year to avoid  over-replacement.  - Vitamin D, Ergocalciferol, (DRISDOL) 1.25 MG (50000 UNIT) CAPS capsule; Take 1 capsule (50,000 Units total) by mouth every 7 (seven) days.  Dispense: 4 capsule; Refill: 0  3. Insulin resistance Katherine Huynh agreed to start metformin 500 mg q AM with no refills. She will continue to work on weight loss, exercise, and decreasing simple carbohydrates to help decrease the risk of diabetes. Katherine Huynh agreed to follow-up with [place] as directed to closely monitor her progress.  - metFORMIN (GLUCOPHAGE) 500 MG tablet; Take 1 tablet (500 mg total) by mouth daily with breakfast.  Dispense: 30 tablet; Refill: 0  4. At risk for diabetes mellitus Katherine Huynh was given approximately 30 minutes of diabetes education and counseling today. We discussed intensive lifestyle modifications today with an emphasis on weight loss as well as increasing exercise and decreasing simple carbohydrates in her diet. We also reviewed medication options with an emphasis on risk versus benefit of those discussed.   Repetitive spaced learning was employed today to elicit superior memory formation and behavioral change.  5. Class 3 severe obesity with serious comorbidity and body mass index (BMI) of 50.0 to 59.9 in adult, unspecified obesity type (HCC) Katherine Huynh is currently in the action stage of change. As such, her goal is to continue with weight loss efforts. She has agreed to the Category 3 Plan.   Lean meat equivalents was discussed and handout was given.  Exercise goals: As is.  Behavioral modification strategies: increasing lean protein intake.  Katherine Huynh has agreed to follow-up with our clinic in 2 weeks. She  was informed of the importance of frequent follow-up visits to maximize her success with intensive lifestyle modifications for her multiple health conditions.   Objective:   Blood pressure 127/82, pulse 89, temperature 98.5 F (36.9 C), height 5\' 3"  (1.6 m), weight (!) 330 lb (149.7 kg), SpO2 98 %. Body mass  index is 58.46 kg/m.  General: Cooperative, alert, well developed, in no acute distress. HEENT: Conjunctivae and lids unremarkable. Cardiovascular: Regular rhythm.  Lungs: Normal work of breathing. Neurologic: No focal deficits.   Lab Results  Component Value Date   CREATININE 0.91 06/23/2020   BUN 11 06/23/2020   NA 139 06/23/2020   K 4.2 06/23/2020   CL 104 06/23/2020   CO2 21 06/23/2020   Lab Results  Component Value Date   ALT 16 06/23/2020   AST 16 06/23/2020   ALKPHOS 95 06/23/2020   BILITOT 0.2 06/23/2020   Lab Results  Component Value Date   HGBA1C 5.5 06/23/2020   HGBA1C 5.6 12/30/2019   HGBA1C 5.7 (H) 09/11/2019   HGBA1C 5.5 10/18/2017   HGBA1C 5.5 06/20/2016   Lab Results  Component Value Date   INSULIN 45.1 (H) 06/23/2020   INSULIN 47.9 (H) 10/21/2018   INSULIN 185.0 (H) 10/18/2017   INSULIN 24.1 06/20/2016   Lab Results  Component Value Date   TSH 2.970 06/23/2020   Lab Results  Component Value Date   CHOL 169 06/23/2020   HDL 43 06/23/2020   LDLCALC 109 (H) 06/23/2020   TRIG 92 06/23/2020   CHOLHDL 4.1 12/30/2019   Lab Results  Component Value Date   WBC 8.6 06/23/2020   HGB 12.2 06/23/2020   HCT 37.5 06/23/2020   MCV 86 06/23/2020   PLT 321 06/23/2020   Lab Results  Component Value Date   IRON 62 05/13/2018   FERRITIN 43 02/17/2016   Attestation Statements:   Reviewed by clinician on day of visit: allergies, medications, problem list, medical history, surgical history, family history, social history, and previous encounter notes.   I, 04/18/2016, am acting as transcriptionist for Burt Knack, MD.  I have reviewed the above documentation for accuracy and completeness, and I agree with the above. -  Quillian Quince, MD

## 2020-07-19 ENCOUNTER — Encounter (INDEPENDENT_AMBULATORY_CARE_PROVIDER_SITE_OTHER): Payer: Self-pay | Admitting: Adult Health

## 2020-07-21 ENCOUNTER — Ambulatory Visit (INDEPENDENT_AMBULATORY_CARE_PROVIDER_SITE_OTHER): Payer: Managed Care, Other (non HMO) | Admitting: Adult Health

## 2020-08-22 ENCOUNTER — Ambulatory Visit: Payer: Managed Care, Other (non HMO) | Admitting: Family Medicine

## 2020-08-22 NOTE — Progress Notes (Deleted)
Name: Katherine Huynh   MRN: [id number]    DOB: Sep 28, 1995   Date:08/22/2020       Progress Note  Subjective  Chief Complaint  Back Pain  HPI    Patient Active Problem List   Diagnosis Date Noted  . Diabetes mellitus ([place]) 06/23/2020  . Primary hypertension 06/23/2020  . Pure hypercholesterolemia 06/23/2020  . Severe dysmenorrhea 05/30/2018  . Moderate episode of recurrent major depressive disorder ([place]) 05/30/2018  . Murmur, cardiac 10/12/2016  . Polycystic disease, ovaries 10/02/2016  . Depression, major, recurrent ([place]) 02/17/2016  . Morbid obesity ([place]) 02/17/2016  . Difficulty controlling anger 02/17/2016  . Athlete's foot, left 10/10/2015  . Personal history of sulfonamide allergy 09/15/2015  . Onychomycosis 09/15/2015  . Fam hx-ischem heart disease 09/15/2015    Past Surgical History:  Procedure Laterality Date  . DG HYSTEROGRAM (HSD)  01/13/2020      . NO PAST SURGERIES      Family History  Problem Relation Age of Onset  . Healthy Mother   . Obesity Mother   . Healthy Father   . Hypertension Father   . Hyperlipidemia Father   . Sleep apnea Father   . Obesity Father   . Cancer Maternal Grandmother        breast  . Heart disease Maternal Grandfather   . Heart disease Paternal Grandfather   . Alcohol abuse Sister   . ADD / ADHD Sister     Social History   Tobacco Use  . Smoking status: Former Smoker    Types: Cigarettes    Quit date: 09/28/2016    Years since quitting: 3.9  . Smokeless tobacco: Never Used  Substance Use Topics  . Alcohol use: Yes    Alcohol/week: 6.0 standard drinks    Types: 3 Glasses of wine, 2 Cans of beer, 1 Shots of liquor per week    Comment: occas     Current Outpatient Medications:  .  diphenhydrAMINE (BENADRYL) 25 mg capsule, Take 25 mg by mouth as needed., Disp: , Rfl:  .  metFORMIN (GLUCOPHAGE) 500 MG tablet, Take 1 tablet (500 mg total) by mouth daily with breakfast., Disp: 30 tablet, Rfl: 0 .  propranolol (INDERAL) 10  MG tablet, Take 1 tablet up to three times a day if needed for severe anxiety., Disp: 90 tablet, Rfl: 0 .  Vitamin D, Ergocalciferol, (DRISDOL) 1.25 MG (50000 UNIT) CAPS capsule, Take 1 capsule (50,000 Units total) by mouth every 7 (seven) days., Disp: 4 capsule, Rfl: 0  Allergies  Allergen Reactions  . Cinnamon Itching  . Erythromycin Other (See Comments)    Unknown reaction, happened in childhood  . Sulfa Antibiotics Rash    I personally reviewed {Reviewed:14835} with the patient/caregiver today.   ROS  ***  Objective  There were no vitals filed for this visit.  There is no height or weight on file to calculate BMI.  Physical Exam ***  Recent Results (from the past 2160 hour(s))  Vitamin B12     Status: None   Collection Time: 06/23/20 10:21 AM  Result Value Ref Range   Vitamin B-12 490 232 - 1,245 pg/mL  CBC with Differential/Platelet     Status: Abnormal   Collection Time: 06/23/20 10:21 AM  Result Value Ref Range   WBC 8.6 3.4 - 10.8 x10E3/uL   RBC 4.38 3.77 - 5.28 x10E6/uL   Hemoglobin 12.2 11.1 - 15.9 g/dL   Hematocrit 37.5 34.0 - 46.6 %   MCV 86 79 - 97  fL   MCH 27.9 26.6 - 33.0 pg   MCHC 32.5 31.5 - 35.7 g/dL   RDW 14.2 11.7 - 15.4 %   Platelets 321 150 - 450 x10E3/uL   Neutrophils 53 Not Estab. %   Lymphs 35 Not Estab. %   Monocytes 6 Not Estab. %   Eos 5 Not Estab. %   Basos 1 Not Estab. %   Neutrophils Absolute 4.6 1.4 - 7.0 x10E3/uL   Lymphocytes Absolute 3.0 0.7 - 3.1 x10E3/uL   Monocytes Absolute 0.5 0.1 - 0.9 x10E3/uL   EOS (ABSOLUTE) 0.5 (H) 0.0 - 0.4 x10E3/uL   Basophils Absolute 0.0 0.0 - 0.2 x10E3/uL   Immature Granulocytes 0 Not Estab. %   Immature Grans (Abs) 0.0 0.0 - 0.1 x10E3/uL  Comprehensive metabolic panel     Status: None   Collection Time: 06/23/20 10:21 AM  Result Value Ref Range   Glucose 89 65 - 99 mg/dL   BUN 11 6 - 20 mg/dL   Creatinine, Ser 0.91 0.57 - 1.00 mg/dL   GFR calc non Af Amer 88 >59 mL/min/1.73   GFR calc Af  Amer 101 >59 mL/min/1.73    Comment: **In accordance with recommendations from the NKF-ASN Task force,**   [facility] is in the process of updating its eGFR calculation to the   2021 CKD-EPI creatinine equation that estimates kidney function   without a race variable.    BUN/Creatinine Ratio 12 9 - 23   Sodium 139 134 - 144 mmol/L   Potassium 4.2 3.5 - 5.2 mmol/L   Chloride 104 96 - 106 mmol/L   CO2 21 20 - 29 mmol/L   Calcium 9.0 8.7 - 10.2 mg/dL   Total Protein 7.2 6.0 - 8.5 g/dL   Albumin 4.1 3.9 - 5.0 g/dL   Globulin, Total 3.1 1.5 - 4.5 g/dL   Albumin/Globulin Ratio 1.3 1.2 - 2.2   Bilirubin Total 0.2 0.0 - 1.2 mg/dL   Alkaline Phosphatase 95 44 - 121 IU/L   AST 16 0 - 40 IU/L   ALT 16 0 - 32 IU/L  Folate     Status: None   Collection Time: 06/23/20 10:21 AM  Result Value Ref Range   Folate 9.4 >3.0 ng/mL    Comment: A serum folate concentration of less than 3.1 ng/mL is considered to represent clinical deficiency.   Hemoglobin A1c     Status: None   Collection Time: 06/23/20 10:21 AM  Result Value Ref Range   Hgb A1c MFr Bld 5.5 4.8 - 5.6 %    Comment:          Prediabetes: 5.7 - 6.4          Diabetes: >6.4          Glycemic control for adults with diabetes: <7.0    Est. average glucose Bld gHb Est-mCnc 111 mg/dL  Insulin, random     Status: Abnormal   Collection Time: 06/23/20 10:21 AM  Result Value Ref Range   INSULIN 45.1 (H) 2.6 - 24.9 uIU/mL  Lipid Panel With LDL/HDL Ratio     Status: Abnormal   Collection Time: 06/23/20 10:21 AM  Result Value Ref Range   Cholesterol, Total 169 100 - 199 mg/dL   Triglycerides 92 0 - 149 mg/dL   HDL 43 >39 mg/dL   VLDL Cholesterol Cal 17 5 - 40 mg/dL   LDL Chol Calc (NIH) 109 (H) 0 - 99 mg/dL   LDL/HDL Ratio 2.5 0.0 - 3.2 ratio  Comment:                                     LDL/HDL Ratio                                             Men  Women                               1/2 Avg.Risk  1.0    1.5                                    Avg.Risk  3.6    3.2                                2X Avg.Risk  6.2    5.0                                3X Avg.Risk  8.0    6.1   T3     Status: None   Collection Time: 06/23/20 10:21 AM  Result Value Ref Range   T3, Total 126 71 - 180 ng/dL  T4     Status: None   Collection Time: 06/23/20 10:21 AM  Result Value Ref Range   T4, Total 7.2 4.5 - 12.0 ug/dL  VITAMIN D 25 Hydroxy (Vit-D Deficiency, Fractures)     Status: Abnormal   Collection Time: 06/23/20 10:21 AM  Result Value Ref Range   Vit D, 25-Hydroxy 15.0 (L) 30.0 - 100.0 ng/mL    Comment: Vitamin D deficiency has been defined by the [place] and an Endocrine Society practice guideline as a level of serum 25-OH vitamin D less than 20 ng/mL (1,2). The Endocrine Society went on to further define vitamin D insufficiency as a level between 21 and 29 ng/mL (2). 1. IOM (Institute of Medicine). 2010. Dietary reference    intakes for calcium and D. [place]: The    [employer]. 2. Holick MF, Binkley [place], Bischoff-Ferrari HA, et al.    Evaluation, treatment, and prevention of vitamin D    deficiency: an Endocrine Society clinical practice    guideline. JCEM. 2011 Jul; 96(7):1911-30.   TSH     Status: None   Collection Time: 06/23/20 10:21 AM  Result Value Ref Range   TSH 2.970 0.450 - 4.500 uIU/mL    Diabetic Foot Exam: Diabetic Foot Exam - Simple   No data filed    ***  PHQ2/9: Depression screen [facility] 2/9 06/23/2020 12/30/2019 10/30/2019 10/30/2019 10/02/2019  Decreased Interest _0 Down, Depressed, Hopeless _1 PHQ - 2 Score _2 Altered sleeping _3 Tired, decreased energy _4 Change in appetite _5 Feeling bad or failure about yourself  _6 Trouble concentrating _7 Moving slowly or fidgety/restless 1 0 0 0 0  Suicidal thoughts 0 0 _0 PHQ-9 Score _1 Difficult doing work/chores Somewhat difficult Extremely  dIfficult Somewhat difficult Somewhat difficult Very difficult  Some recent data might be hidden    phq 9 is {gen pos [id number]} ***  Fall Risk: Fall Risk  02/11/2019 07/18/2018 05/30/2018 02/27/2018 12/04/2017  Falls in the past year? 0 0 0 No No  Number falls in past yr: 0 0 - - -  Injury with Fall? 0 0 - - -   ***   Functional Status Survey:   ***   Assessment & Plan  *** There are no diagnoses linked to this encounter.

## 2020-09-20 ENCOUNTER — Ambulatory Visit: Payer: Managed Care, Other (non HMO) | Admitting: Obstetrics and Gynecology

## 2020-09-20 ENCOUNTER — Encounter: Payer: Self-pay | Admitting: Obstetrics and Gynecology

## 2020-09-20 ENCOUNTER — Other Ambulatory Visit: Payer: Self-pay

## 2020-09-20 VITALS — BP 127/88 | HR 93 | Ht 63.0 in | Wt 337.6 lb

## 2020-09-20 DIAGNOSIS — Z8659 Personal history of other mental and behavioral disorders: Secondary | ICD-10-CM | POA: Diagnosis not present

## 2020-09-20 DIAGNOSIS — E282 Polycystic ovarian syndrome: Secondary | ICD-10-CM

## 2020-09-20 DIAGNOSIS — F419 Anxiety disorder, unspecified: Secondary | ICD-10-CM

## 2020-09-20 MED ORDER — MEDROXYPROGESTERONE ACETATE 10 MG PO TABS: 10.0000 mg | ORAL_TABLET | Freq: Every day | ORAL | 3 refills | Status: DC

## 2020-09-20 NOTE — Progress Notes (Signed)
    GYNECOLOGY PROGRESS NOTE  Subjective:    Patient ID: Katherine Huynh, female    DOB: 1996/04/20, 25 y.o.   MRN: [id number]  HPI  Patient is a 25 y.o. G45P0020 female who presents for complaints of abnormal menstrual cycle.  She has a history of PCOS with irregular cycles.  Notes that most recent episode of bleeding has lasted 22 days. Bleeding varies in flow, from light to heavy with small clots.  Has discomfort and pressure in the pelvis but denies frank pain.    Patient desires referral to Psychiatry.  She has a history of depression, but currently noting anxiety symptoms.  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   The following portions of the patient's history were reviewed and updated as appropriate: allergies, current medications, past family history, past medical history, past social history, past surgical history and problem list.  Review of Systems Pertinent items noted in HPI and remainder of comprehensive ROS otherwise negative.   Objective:   Blood pressure 127/88, pulse 93, height 5\' 3"  (1.6 m), weight (!) 337 lb 9.6 oz (153.1 kg), last menstrual period 08/29/2020. General appearance: alert and no distress Abdomen: soft, non-tender; bowel sounds normal; no masses,  no organomegaly Pelvic: external genitalia normal, rectovaginal septum normal.  Vagina without discharge.  Cervix normal appearing, no lesions and no motion tenderness. Unable to palpate uterus or adnexae due to body habitus.  Extremities: extremities normal, atraumatic, no cyanosis or edema Neurologic: Grossly normal   Assessment:   PCOS Irregular cycles H/o depression Anxiety  Plan:   - PCOS with h/o irregular cycles. Has had to utilize Provera in the past.  Is typically opposed to use of birth control. Has not been using anything recently.  Will order Provera with several refills. Advise to utilize at least every 3 months if cycles don't occur spontaneously.  Is at  high risk for endometrial hyperplasia.  - Desires referral to Pyschiatry. Referral placed.  - Return to clinic for any scheduled appointments or for any gynecologic concerns as needed. Is due for an annual exam, advised to schedule.    08/31/2020, MD [facility]

## 2020-09-20 NOTE — Progress Notes (Signed)
Pt present today due to having prolonged cycles lasting 22 days. LMP 08/29/2020, light to heavy bleeding off and on; more discomfort and pressure in the abd area than pain.  PHQ-9=23. GAD-7=19.

## 2020-09-27 DIAGNOSIS — N939 Abnormal uterine and vaginal bleeding, unspecified: Secondary | ICD-10-CM

## 2020-10-03 MED ORDER — NORGESTIMATE-ETH ESTRADIOL 0.25-35 MG-MCG PO TABS: ORAL_TABLET | ORAL | 3 refills | Status: DC

## 2020-10-07 NOTE — Telephone Encounter (Signed)
She can have a work notice.

## 2020-10-12 ENCOUNTER — Encounter: Payer: Self-pay | Admitting: Obstetrics and Gynecology

## 2020-10-12 ENCOUNTER — Other Ambulatory Visit (HOSPITAL_COMMUNITY)
Admission: RE | Admit: 2020-10-12 | Discharge: 2020-10-12 | Disposition: A | Discharge status: Home / Self Care | Payer: Managed Care, Other (non HMO) | Source: Ambulatory Visit | Attending: Obstetrics and Gynecology | Admitting: Obstetrics and Gynecology

## 2020-10-12 ENCOUNTER — Ambulatory Visit: Payer: Managed Care, Other (non HMO) | Admitting: Obstetrics and Gynecology

## 2020-10-12 ENCOUNTER — Other Ambulatory Visit: Payer: Self-pay

## 2020-10-12 VITALS — BP 148/75 | HR 85 | Ht 63.0 in | Wt 340.5 lb

## 2020-10-12 DIAGNOSIS — N938 Other specified abnormal uterine and vaginal bleeding: Secondary | ICD-10-CM | POA: Diagnosis not present

## 2020-10-12 DIAGNOSIS — E282 Polycystic ovarian syndrome: Secondary | ICD-10-CM | POA: Diagnosis not present

## 2020-10-12 DIAGNOSIS — Z6841 Body Mass Index (BMI) 40.0 and over, adult: Secondary | ICD-10-CM | POA: Diagnosis not present

## 2020-10-12 MED ORDER — ONDANSETRON HCL 4 MG PO TABS: 4.0000 mg | ORAL_TABLET | Freq: Three times a day (TID) | ORAL | 0 refills | Status: DC | PRN

## 2020-10-12 MED ORDER — MEGESTROL ACETATE 40 MG PO TABS: 40.0000 mg | ORAL_TABLET | Freq: Two times a day (BID) | ORAL | 5 refills | Status: DC

## 2020-10-12 NOTE — Addendum Note (Signed)
Addended by: Fabian November on: 10/12/2020 11:32 PM   Modules accepted: Orders

## 2020-10-12 NOTE — Progress Notes (Addendum)
GYNECOLOGY PROGRESS NOTE  Subjective:    Patient ID: Katherine Huynh, female    DOB: 12-14-1995, 25 y.o.   MRN: [id number]  HPI  Patient is a 25 y.o. G5P0020 female with a history of PCOS who presents for complaints of prolonged menstrual cycle and nausea and vomiting.  Patient's last menstrual period was 08/29/2020 and is still ongoing.  She has tried a trial of Provera and most recently a trial of OCP taper, however is still bleeding (although not as heavy).  Nausea and vomiting has been ongoing for the past several days.   The following portions of the patient's history were reviewed and updated as appropriate: allergies, current medications, past family history, past medical history, past social history, past surgical history and problem list.  Review of Systems Pertinent items noted in HPI and remainder of comprehensive ROS otherwise negative.   Objective:   Blood pressure (!) 148/75, pulse 85, height 5\' 3"  (1.6 m), weight (!) 340 lb 8 oz (154.4 kg), last menstrual period 08/29/2020. Body mass index is 60.32 kg/m. General appearance: alert and no distress, morbid obesity Abdomen: soft, non-tender; bowel sounds normal; no masses,  no organomegaly Pelvic: external genitalia normal, rectovaginal septum normal.  Vagina without discharge.  Cervix normal appearing, no lesions and no motion tenderness.  Uterus mobile, nontender, normal shape and size.  Adnexae non-palpable, nontender bilaterally.  Extremities: extremities normal, atraumatic, no cyanosis or edema Neurologic: Grossly normal    Labs:  Results for orders placed or performed in visit on 06/23/20  Vitamin B12  Result Value Ref Range   Vitamin B-12 490 232 - 1,245 pg/mL  CBC with Differential/Platelet  Result Value Ref Range   WBC 8.6 3.4 - 10.8 x10E3/uL   RBC 4.38 3.77 - 5.28 x10E6/uL   Hemoglobin 12.2 11.1 - 15.9 g/dL   Hematocrit 08/21/20 95.0 - 46.6 %   MCV 86 79 - 97 fL   MCH 27.9 26.6 - 33.0 pg   MCHC 32.5 31.5 - 35.7  g/dL   RDW 93.2 67.1 - 24.5 %   Platelets 321 150 - 450 x10E3/uL   Neutrophils 53 Not Estab. %   Lymphs 35 Not Estab. %   Monocytes 6 Not Estab. %   Eos 5 Not Estab. %   Basos 1 Not Estab. %   Neutrophils Absolute 4.6 1.4 - 7.0 x10E3/uL   Lymphocytes Absolute 3.0 0.7 - 3.1 x10E3/uL   Monocytes Absolute 0.5 0.1 - 0.9 x10E3/uL   EOS (ABSOLUTE) 0.5 (H) 0.0 - 0.4 x10E3/uL   Basophils Absolute 0.0 0.0 - 0.2 x10E3/uL   Immature Granulocytes 0 Not Estab. %   Immature Grans (Abs) 0.0 0.0 - 0.1 x10E3/uL  Comprehensive metabolic panel  Result Value Ref Range   Glucose 89 65 - 99 mg/dL   BUN 11 6 - 20 mg/dL   Creatinine, Ser 80.9 0.57 - 1.00 mg/dL   GFR calc non Af Amer 88 >59 mL/min/1.73   GFR calc Af Amer 101 >59 mL/min/1.73   BUN/Creatinine Ratio 12 9 - 23   Sodium 139 134 - 144 mmol/L   Potassium 4.2 3.5 - 5.2 mmol/L   Chloride 104 96 - 106 mmol/L   CO2 21 20 - 29 mmol/L   Calcium 9.0 8.7 - 10.2 mg/dL   Total Protein 7.2 6.0 - 8.5 g/dL   Albumin 4.1 3.9 - 5.0 g/dL   Globulin, Total 3.1 1.5 - 4.5 g/dL   Albumin/Globulin Ratio 1.3 1.2 - 2.2   Bilirubin Total  0.2 0.0 - 1.2 mg/dL   Alkaline Phosphatase 95 44 - 121 IU/L   AST 16 0 - 40 IU/L   ALT 16 0 - 32 IU/L  Folate  Result Value Ref Range   Folate 9.4 >3.0 ng/mL  Hemoglobin A1c  Result Value Ref Range   Hgb A1c MFr Bld 5.5 4.8 - 5.6 %   Est. average glucose Bld gHb Est-mCnc 111 mg/dL  Insulin, random  Result Value Ref Range   INSULIN 45.1 (H) 2.6 - 24.9 uIU/mL  Lipid Panel With LDL/HDL Ratio  Result Value Ref Range   Cholesterol, Total 169 100 - 199 mg/dL   Triglycerides 92 0 - 149 mg/dL   HDL 43 >51 mg/dL   VLDL Cholesterol Cal 17 5 - 40 mg/dL   LDL Chol Calc (NIH) 700 (H) 0 - 99 mg/dL   LDL/HDL Ratio 2.5 0.0 - 3.2 ratio  T3  Result Value Ref Range   T3, Total 126 71 - 180 ng/dL  T4  Result Value Ref Range   T4, Total 7.2 4.5 - 12.0 ug/dL  VITAMIN D 25 Hydroxy (Vit-D Deficiency, Fractures)  Result Value Ref Range    Vit D, 25-Hydroxy 15.0 (L) 30.0 - 100.0 ng/mL  TSH  Result Value Ref Range   TSH 2.970 0.450 - 4.500 uIU/mL   Assessment:   1. PCO (polycystic ovaries)   2. Dysfunctional uterine bleeding   3. BMI 60.0-69.9, adult [facility])     Plan:    - Patient has abnormal uterine bleeding . She has a normal exam, no evidence of lesions.  Planned to reassess PCOS labs however patient has been on OCP taper and may likely influence lab results.  We will hold off on ordering labs at this time. UPT ordered today was negative.  Will order lab pelvic ultrasound to evaluate for any structural gynecologic abnormalities.  Ultrasound was performed in 2020.  Will contact patient with these results and plans for further evaluation/management.  Endometrial biopsy also performed today as patient has not been responsive to recommended treatments thus far and risk factors including PCOS and morbid obesity for endometrial hyperplasia or malignancy.,  However she  had been responsive to treatments in the past.  See below for procedure note.  Will prescribe Megace to help with bleeding.  Advised to discontinue combined OCPs at this time. -Discussed that if biopsy results are benign, and she continues to have bleeding despite use of the Megace, the next step would likely be to consider surgical options such as D&C.  Patient notes understanding.  Will notify patient of endometrial biopsy results by phone.  Scheduled for repeat pelvic ultrasound.  We will follow-up in 3 months after use of Megace unless otherwise indicated by biopsy results.   Endometrial Biopsy Procedure Note  The patient is positioned on the exam table in the dorsal lithotomy position. Bimanual exam confirms uterine position and size. A Graves speculum is placed into the vagina. A single toothed tenaculum is placed onto the anterior lip of the cervix. The pipette is placed into the endocervical canal however is unable to advance beyond 4-5 cm due to resistance  to further passage.  A sound was passed and uterine length was measured at 8 cm.  The endometrial Pipelle was again used to attempt to advance to the uterine fundus without success. Using a piston like technique, with vacuum created by withdrawing the stylus, the endometrial specimen is obtained and transferred to the biopsy container. Minimal bleeding is encountered. The  procedure is well tolerated.   Uterine Position: anterior    Uterine Length: 8 cm   Uterine Specimen: Scant   Post procedure instructions are given. The patient will be notified of results.     Hildred Laser, MD [facility]

## 2020-10-12 NOTE — Progress Notes (Signed)
Pt present today for prolonged cycles and  Vomiting. Pt's lmp 08/29/2020 pt is still on cycle daily flow.  Pt had endometrial bx today.

## 2020-10-12 NOTE — Patient Instructions (Addendum)
Endometrial Biopsy  An endometrial biopsy is a procedure to remove tissue samples from the endometrium, which is the lining of the uterus. The tissue that is removed can then be checked under a microscope for disease. This procedure is used to diagnose conditions such as endometrial cancer, endometrial tuberculosis, polyps, or other inflammatory conditions. This procedure may also be used to investigate uterine bleeding to determine where you are in your menstrual cycle or how your hormone levels are affecting the lining of the uterus. Tell a health care provider about:  Any allergies you have.  All medicines you are taking, including vitamins, herbs, eye drops, creams, and over-the-counter medicines.  Any problems you or family members have had with anesthetic medicines.  Any blood disorders you have.  Any surgeries you have had.  Any medical conditions you have.  Whether you are pregnant or may be pregnant. What are the risks? Generally, this is a safe procedure. However, problems may occur, including:  Bleeding.  Pelvic infection.  Puncture of the wall of the uterus with the biopsy device (rare).  Allergic reactions to medicines. What happens before the procedure?  Keep a record of your menstrual cycles as told by your health care provider. You may need to schedule your procedure for a specific time in your cycle.  You may want to bring a sanitary pad to wear after the procedure.  Plan to have someone take you home from the hospital or clinic.  Ask your health care provider about: ? Changing or stopping your regular medicines. This is especially important if you are taking diabetes medicines, arthritis medicines, or blood thinners. ? Taking medicines such as aspirin and ibuprofen. These medicines can thin your blood. Do not take these medicines unless your health care provider tells you to take them. ? Taking over-the-counter medicines, vitamins, herbs, and  supplements. What happens during the procedure?  You will lie on an exam table with your feet and legs supported as in a pelvic exam.  Your health care provider will insert an instrument (speculum) into your vagina to see your cervix.  Your cervix will be cleansed with an antiseptic solution.  A medicine (local anesthetic) will be used to numb the cervix.  A forceps instrument (tenaculum) will be used to hold your cervix steady for the biopsy.  A thin, rod-like instrument (uterine sound) will be inserted through your cervix to determine the length of your uterus and the location where the biopsy sample will be removed.  A thin, flexible tube (catheter) will be inserted through your cervix and into the uterus. The catheter will be used to collect the biopsy sample from your endometrial tissue.  The catheter and speculum will then be removed, and the tissue sample will be sent to a lab for examination. The procedure may vary among health care providers and hospitals. What can I expect after procedure?  You will rest in a recovery area until you are ready to go home.  You may have mild cramping and a small amount of vaginal bleeding. This is normal.  You may have a small amount of vaginal bleeding for a few days. This is normal.  It is up to you to get the results of your procedure. Ask your health care provider, or the department that is doing the procedure, when your results will be ready. Follow these instructions at home:  Take over-the-counter and prescription medicines only as told by your health care provider.  Do not douche, use tampons, or have   sexual intercourse until your health care provider approves.  Return to your normal activities as told by your health care provider. Ask your health care provider what activities are safe for you.  Follow instructions from your health care provider about any activity restrictions, such as restrictions on strenuous exercise or heavy  lifting.  Keep all follow-up visits. This is important. Contact a health care provider:  You have heavy bleeding, or bleed for longer than 2 days after the procedure.  You have bad smelling discharge from your vagina.  You have a fever or chills.  You have a burning sensation when urinating or you have difficulty urinating.  You have severe pain in your lower abdomen. Get help right away if you:  You have severe cramps in your stomach or back.  You pass large blood clots.  Your bleeding increases.  You become weak or light-headed, or you faint or lose consciousness. Summary  An endometrial biopsy is a procedure to remove tissue samples is taken from the endometrium, which is the lining of the uterus.  The tissue sample that is removed will be checked under a microscope for disease.  This procedure is used to diagnose conditions such as endometrial cancer, endometrial tuberculosis, polyps, or other inflammatory conditions.  After the procedure, it is common to have mild cramping and a small amount of vaginal bleeding for a few days.  Do not douche, use tampons, or have sexual intercourse until your health care provider approves. Ask your health care provider which activities are safe for you. This information is not intended to replace advice given to you by your health care provider. Make sure you discuss any questions you have with your health care provider. Document Revised: 11/23/2019 Document Reviewed: 11/23/2019 Elsevier Patient Education  2021 [employer]  Abnormal Uterine Bleeding Abnormal uterine bleeding means bleeding more than usual from your womb (uterus). It can include:  Bleeding between menstrual periods.  Bleeding after sex.  Bleeding that is heavier than normal.  Menstrual periods that last longer than usual.  Bleeding after you have stopped having your menstrual period (menopause). There are many problems that may cause this. You should see a  doctor for any kind of bleeding that is not normal. Treatment depends on the cause of the bleeding. Follow these instructions at home: Medicines  Take over-the-counter and prescription medicines only as told by your doctor.  Tell your doctor about other medicines that you take. ? If told by your doctor, stop taking aspirin or medicines that have aspirin in them. These medicines can make you bleed more.  You may be given iron pills to replace iron that your body loses because of this condition. Take them as told by your doctor. Managing constipation If you are taking iron pills, you may have trouble pooping (constipation). To prevent or treat trouble pooping, you may need to:  Drink enough fluid to keep your pee (urine) pale yellow.  Take over-the-counter or prescription medicines.  Eat foods that are high in fiber. These include beans, whole grains, and fresh fruits and vegetables.  Limit foods that are high in fat and sugar. These include fried or sweet foods. General instructions  Watch your condition for any changes.  Do not use tampons, douche, or have sex, if your doctor tells you not to.  Change your pads often.  Get regular exams. This includes pelvic exams and cervical cancer screenings. ? It is up to you to get the results of any tests that are  done. Ask your doctor, or the department that is doing the tests, when your results will be ready.  Keep all follow-up visits as told by your doctor. This is important. Contact a doctor if:  The bleeding lasts more than 1 week.  You feel dizzy at times.  You feel like you may vomit (nausea).  You vomit.  You feel light-headed or weak.  Your symptoms get worse. Get help right away if:  You pass out.  You have to change pads every hour.  You have pain in your belly.  You have a fever or chills.  You get sweaty.  You get weak.  You pass large blood clots from your vagina. Summary  Abnormal uterine bleeding  means bleeding more than usual from your womb (uterus).  Any kind of bleeding that is not normal should be checked by a doctor.  Treatment depends on the cause of the bleeding.  Get help right away if you pass out, you have to change pads every hour, or you pass large blood clots from your vagina. This information is not intended to replace advice given to you by your health care provider. Make sure you discuss any questions you have with your health care provider. Document Revised: 03/03/2019 Document Reviewed: 03/03/2019 Elsevier Patient Education  2021 [employer].

## 2020-10-13 LAB — SURGICAL PATHOLOGY

## 2020-10-13 LAB — POCT URINE PREGNANCY: Preg Test, Ur: NEGATIVE

## 2020-10-14 ENCOUNTER — Emergency Department: Payer: Managed Care, Other (non HMO)

## 2020-10-14 ENCOUNTER — Emergency Department
Admission: EM | Admit: 2020-10-14 | Discharge: 2020-10-14 | Disposition: A | Discharge status: Home / Self Care | Payer: Managed Care, Other (non HMO) | Attending: Emergency Medicine | Admitting: Emergency Medicine

## 2020-10-14 ENCOUNTER — Other Ambulatory Visit: Payer: Self-pay

## 2020-10-14 DIAGNOSIS — N939 Abnormal uterine and vaginal bleeding, unspecified: Secondary | ICD-10-CM | POA: Diagnosis not present

## 2020-10-14 DIAGNOSIS — R102 Pelvic and perineal pain: Secondary | ICD-10-CM | POA: Diagnosis not present

## 2020-10-14 DIAGNOSIS — I10 Essential (primary) hypertension: Secondary | ICD-10-CM | POA: Diagnosis not present

## 2020-10-14 DIAGNOSIS — Z7984 Long term (current) use of oral hypoglycemic drugs: Secondary | ICD-10-CM | POA: Diagnosis not present

## 2020-10-14 DIAGNOSIS — E119 Type 2 diabetes mellitus without complications: Secondary | ICD-10-CM | POA: Diagnosis not present

## 2020-10-14 DIAGNOSIS — Z87891 Personal history of nicotine dependence: Secondary | ICD-10-CM | POA: Diagnosis not present

## 2020-10-14 DIAGNOSIS — Z79899 Other long term (current) drug therapy: Secondary | ICD-10-CM | POA: Diagnosis not present

## 2020-10-14 DIAGNOSIS — R109 Unspecified abdominal pain: Secondary | ICD-10-CM | POA: Diagnosis present

## 2020-10-14 LAB — COMPREHENSIVE METABOLIC PANEL
ALT: 27 U/L (ref 0–44)
AST: 19 U/L (ref 15–41)
Albumin: 4.1 g/dL (ref 3.5–5.0)
Alkaline Phosphatase: 79 U/L (ref 38–126)
Anion gap: 9 (ref 5–15)
BUN: 12 mg/dL (ref 6–20)
CO2: 22 mmol/L (ref 22–32)
Calcium: 9.2 mg/dL (ref 8.9–10.3)
Chloride: 106 mmol/L (ref 98–111)
Creatinine, Ser: 0.89 mg/dL (ref 0.44–1.00)
GFR, Estimated: >60 mL/min (ref >60)
Glucose, Bld: 84 mg/dL (ref 70–99)
Potassium: 3.9 mmol/L (ref 3.5–5.1)
Sodium: 137 mmol/L (ref 135–145)
Total Bilirubin: 0.5 mg/dL (ref 0.3–1.2)
Total Protein: 8.3 g/dL — ABNORMAL HIGH (ref 6.5–8.1)

## 2020-10-14 LAB — CBC WITH DIFFERENTIAL/PLATELET
Abs Immature Granulocytes: 0.01 10*3/uL (ref 0.00–0.07)
Basophils Absolute: 0 10*3/uL (ref 0.0–0.1)
Basophils Relative: 0 %
Eosinophils Absolute: 0.4 10*3/uL (ref 0.0–0.5)
Eosinophils Relative: 5 %
HCT: 37.4 % (ref 36.0–46.0)
Hemoglobin: 12.4 g/dL (ref 12.0–15.0)
Immature Granulocytes: 0 %
Lymphocytes Relative: 24 %
Lymphs Abs: 1.8 10*3/uL (ref 0.7–4.0)
MCH: 27.6 pg (ref 26.0–34.0)
MCHC: 33.2 g/dL (ref 30.0–36.0)
MCV: 83.1 fL (ref 80.0–100.0)
Monocytes Absolute: 0.5 10*3/uL (ref 0.1–1.0)
Monocytes Relative: 6 %
Neutro Abs: 4.9 10*3/uL (ref 1.7–7.7)
Neutrophils Relative %: 65 %
Platelets: 309 10*3/uL (ref 150–400)
RBC: 4.5 MIL/uL (ref 3.87–5.11)
RDW: 15.4 % (ref 11.5–15.5)
WBC: 7.6 10*3/uL (ref 4.0–10.5)
nRBC: 0 % (ref 0.0–0.2)

## 2020-10-14 LAB — TYPE AND SCREEN
ABO/RH(D): O POS
Antibody Screen: NEGATIVE

## 2020-10-14 LAB — LIPASE, BLOOD: Lipase: 29 U/L (ref 11–51)

## 2020-10-14 LAB — URINALYSIS, ROUTINE W REFLEX MICROSCOPIC
Bacteria, UA: NONE SEEN
Bilirubin Urine: NEGATIVE
Glucose, UA: NEGATIVE mg/dL
Ketones, ur: NEGATIVE mg/dL
Nitrite: NEGATIVE
Protein, ur: 30 mg/dL — AB
RBC / HPF: >50 RBC/hpf — ABNORMAL HIGH (ref 0–5)
Specific Gravity, Urine: 1.019 (ref 1.005–1.030)
pH: 6 (ref 5.0–8.0)

## 2020-10-14 LAB — PREGNANCY, URINE: Preg Test, Ur: NEGATIVE

## 2020-10-14 MED ORDER — MORPHINE SULFATE (PF) 4 MG/ML IV SOLN
4.0000 mg | Freq: Once | INTRAVENOUS | Status: AC
  Administered 2020-10-14: 4 mg via INTRAVENOUS
  Filled 2020-10-14: qty 1

## 2020-10-14 MED ORDER — ONDANSETRON HCL 4 MG/2ML IJ SOLN
4.0000 mg | Freq: Once | INTRAMUSCULAR | Status: AC
  Administered 2020-10-14: 4 mg via INTRAVENOUS
  Filled 2020-10-14: qty 2

## 2020-10-14 MED ORDER — NAPROXEN 500 MG PO TABS: 500.0000 mg | ORAL_TABLET | Freq: Two times a day (BID) | ORAL | 0 refills | Status: DC

## 2020-10-14 NOTE — ED Provider Notes (Signed)
[facility] Emergency Department Provider Note  ____________________________________________   Event Date/Time   First MD Initiated Contact with Patient 10/14/20 1833     (approximate)  I have reviewed the triage vital signs and the nursing notes.   HISTORY  Chief Complaint Abdominal Pain    HPI Katherine Huynh is a 25 y.o. female presents emergency department complaining of abdominal pain for 2 days.  Patient states she had a uterine biopsy done 3 days ago by Dr. Valentino Saxon.  States she has had heavy bleeding for at least 2 to 3 months.  States that the bleeding has continued.  They had tried birth control pills without any relief.  States she is just very tired of bleeding all the time.  States that now she will feel short of breath upon walking.  She denies chest pain or shortness of breath at this time.  States she is fine because she has been sitting for a few hours.  She denies any UTI symptoms.    Past Medical History:  Diagnosis Date  . Anxiety   . Bilateral swelling of feet   . Constipation   . Depression   . Fam hx-ischem heart disease 09/15/2015  . Heart murmur   . Infertility, female   . Morbid obesity (HCC) 02/17/2016  . Multiple food allergies   . Other fatigue   . Polycystic disease, ovaries   . Prediabetes   . Shortness of breath on exertion   . Vitamin D deficiency     Patient Active Problem List   Diagnosis Date Noted  . Diabetes mellitus (HCC) 06/23/2020  . Primary hypertension 06/23/2020  . Pure hypercholesterolemia 06/23/2020  . Severe dysmenorrhea 05/30/2018  . Moderate episode of recurrent major depressive disorder (HCC) 05/30/2018  . Borderline personality disorder in adult [facility]) 01/15/2018  . Murmur, cardiac 10/12/2016  . Polycystic disease, ovaries 10/02/2016  . 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 Morbid obesity (HCC) 02/17/2016  . Difficulty controlling anger 02/17/2016  . Athlete's foot, left 10/10/2015  .  Personal history of sulfonamide allergy 09/15/2015  . Onychomycosis 09/15/2015  . Fam hx-ischem heart disease 09/15/2015    Past Surgical History:  Procedure Laterality Date  . DG HYSTEROGRAM (HSD)  01/13/2020      . NO PAST SURGERIES      Prior to Admission medications   Medication Sig Start Date End Date Taking? Authorizing Provider  diphenhydrAMINE (BENADRYL) 25 mg capsule Take 25 mg by mouth as needed.    [provider]  megestrol (MEGACE) 40 MG tablet Take 1 tablet (40 mg total) by mouth 2 (two) times daily. Can increase to two tablets twice a day in the event of heavy bleeding 10/12/20   Hildred Laser, MD  metFORMIN (GLUCOPHAGE) 500 MG tablet Take 1 tablet (500 mg total) by mouth daily with breakfast. 07/07/20   Quillian Quince D, MD  ondansetron (ZOFRAN) 4 MG tablet Take 1 tablet (4 mg total) by mouth every 8 (eight) hours as needed for nausea or vomiting. 10/12/20   Hildred Laser, MD  propranolol (INDERAL) 10 MG tablet Take 1 tablet up to three times a day if needed for severe anxiety. 04/16/19   Doren Custard, FNP  Vitamin D, Ergocalciferol, (DRISDOL) 1.25 MG (50000 UNIT) CAPS capsule Take 1 capsule (50,000 Units total) by mouth every 7 (seven) days. 07/07/20   Quillian Quince D, MD    Allergies Cinnamon, Erythromycin, and Sulfa antibiotics  Family History  Problem Relation Age of Onset  .  Obesity Mother   . Hypertension Father   . Hyperlipidemia Father   . Sleep apnea Father   . Obesity Father   . Cancer Maternal Grandmother        breast  . Fibroids Maternal Grandmother   . Heart disease Maternal Grandfather   . Heart disease Paternal Grandfather   . Alcohol abuse Sister   . ADD / ADHD Sister     Social History Social History   Tobacco Use  . Smoking status: Former Smoker    Types: Cigarettes    Quit date: 09/28/2016    Years since quitting: 4.0  . Smokeless tobacco: Never Used  Vaping Use  . Vaping Use: Never used  Substance Use Topics  . Alcohol use:  Yes    Alcohol/week: 6.0 standard drinks    Types: 3 Glasses of wine, 2 Cans of beer, 1 Shots of liquor per week    Comment: occas  . Drug use: Not Currently    Types: Marijuana    Comment: last used about a month ago    Review of Systems  Constitutional: No fever/chills Eyes: No visual changes. ENT: No sore throat. Respiratory: Denies cough Cardiovascular: Denies chest pain Gastrointestinal: Positive abdominal pain Genitourinary: Negative for dysuria. Musculoskeletal: Negative for back pain. Skin: Negative for rash. Psychiatric: no mood changes,     ____________________________________________   PHYSICAL EXAM:  VITAL SIGNS: ED Triage Vitals  Enc Vitals Group     BP 10/14/20 1747 (!) 139/91     Pulse Rate 10/14/20 1747 92     Resp 10/14/20 1747 18     Temp 10/14/20 1747 98.7 F (37.1 C)     Temp Source 10/14/20 1747 Oral     SpO2 10/14/20 1747 99 %     Weight 10/14/20 1748 (!) 340 lb (154.2 kg)     Height 10/14/20 1748 5\' 3"  (1.6 m)     Head Circumference --      Peak Flow --      Pain Score 10/14/20 1748 8     Pain Loc --      Pain Edu? --      Excl. in GC? --     Constitutional: Alert and oriented. Well appearing and in no acute distress. Eyes: Conjunctivae are normal.  Head: Atraumatic. Nose: No congestion/rhinnorhea. Mouth/Throat: Mucous membranes are moist.   Neck:  supple no lymphadenopathy noted Cardiovascular: Normal rate, regular rhythm. Heart sounds are normal Respiratory: Normal respiratory effort.  No retractions, lungs c t a  Abd: soft tender in the left upper quadrant and lower quads bilaterally bs normal all 4 quad GU: deferred Musculoskeletal: FROM all extremities, warm and well perfused Neurologic:  Normal speech and language.  Skin:  Skin is warm, dry and intact. No rash noted. Psychiatric: Mood and affect are normal. Speech and behavior are normal.  ____________________________________________   LABS (all labs ordered are listed,  but only abnormal results are displayed)  Labs Reviewed  COMPREHENSIVE METABOLIC PANEL - Abnormal; Notable for the following components:      Result Value   Total Protein 8.3 (*)    All other components within normal limits  LIPASE, BLOOD  CBC WITH DIFFERENTIAL/PLATELET  URINALYSIS, ROUTINE W REFLEX MICROSCOPIC  PREGNANCY, URINE  POC URINE PREG, ED  TYPE AND SCREEN   ____________________________________________   ____________________________________________  RADIOLOGY  CT abdomen/pelvis without contrast due to contrast shortage 12/14/20 pelvis with transvaginal  ____________________________________________   PROCEDURES  Procedure(s) performed: No  Procedures  ____________________________________________   INITIAL IMPRESSION / ASSESSMENT AND PLAN / ED COURSE  Pertinent labs & imaging results that were available during my care of the patient were reviewed by me and considered in my medical decision making (see chart for details).   Patient is a 25 year old female presents emergency department with abdominal pain.  See HPI.  Physical exam shows patient per stable  DDx: Pain secondary to biopsy, punctured uterine wall, appendicitis, menorrhagia, retained products of pregnancy, anemia  CBC is reassuring, all levels are in normal range, comprehensive metabolic panel is normal, lipase is normal  CT abdomen/pelvis without contrast secondary to contrast shortage, is reviewed by me and confirmed by radiology.  Radiologist comments that the endometrium is thickened and warrants pelvic ultrasound, patient also has a ovarian cyst noted on the right  Ultrasound ordered  Care is being transferred to Dr. Derrill Kay at this time.     Alichia Alridge was evaluated in Emergency Department on 10/14/2020 for the symptoms described in the history of present illness. She was evaluated in the context of the global COVID-19 pandemic, which necessitated consideration that the patient might be at  risk for infection with the SARS-CoV-2 virus that causes COVID-19. Institutional protocols and algorithms that pertain to the evaluation of patients at risk for COVID-19 are in a state of rapid change based on information released by regulatory bodies including the CDC and federal and state organizations. These policies and algorithms were followed during the patient's care in the ED.    As part of my medical decision making, I reviewed the following data within the electronic MEDICAL RECORD NUMBER Nursing notes reviewed and incorporated, Labs reviewed , Old chart reviewed, Patient signed out to , Radiograph reviewed , Evaluated by EM attending , Notes from prior ED visits and [place] Controlled Substance Database  ____________________________________________   FINAL CLINICAL IMPRESSION(S) / ED DIAGNOSES  Final diagnoses:  Vaginal bleeding  Pelvic pain      NEW MEDICATIONS STARTED DURING THIS VISIT:  New Prescriptions   No medications on file     Note:  This document was prepared using Dragon voice recognition software and may include unintentional dictation errors.    Faythe Ghee, PA-C 10/14/20 1930    Phineas Semen, MD 10/14/20 2038

## 2020-10-14 NOTE — ED Triage Notes (Signed)
Pt states she had a biopsy done 3 days ago, and has lower abdominal pain x2 days. Pt states she has had menstrual bleeding for the last two months- states she saturates 2 pads/hr when bleeding is heavy. Pt reports dizziness and SOB with exertion. Pt reports pain w/ urination. Pt denies n/v/d, denies CP.

## 2020-10-14 NOTE — ED Notes (Signed)
POC urine pregnancy negative. 

## 2020-10-14 NOTE — ED Notes (Signed)
Patient transported to CT 

## 2020-10-14 NOTE — ED Notes (Signed)
Report received from [place], RN

## 2020-10-14 NOTE — ED Notes (Signed)
ED Provider at bedside. 

## 2020-10-14 NOTE — Discharge Instructions (Addendum)
Please seek medical attention for any high fevers, chest pain, shortness of breath, change in behavior, persistent vomiting, bloody stool or any other new or concerning symptoms.  

## 2020-10-18 ENCOUNTER — Telehealth: Payer: Self-pay | Admitting: Obstetrics and Gynecology

## 2020-10-18 NOTE — Telephone Encounter (Signed)
Patient is requesting a official letter stating her condition to take to court. Patient is asking that Dr Valentino Saxon call her to discuss a person matter.  Patient states that she needs to talk to Dr Valentino Saxon today.

## 2020-10-20 ENCOUNTER — Encounter: Payer: Self-pay | Admitting: Obstetrics and Gynecology

## 2020-10-21 NOTE — Progress Notes (Signed)
error 

## 2020-10-31 ENCOUNTER — Ambulatory Visit
Admission: RE | Admit: 2020-10-31 | Discharge: 2020-10-31 | Disposition: A | Discharge status: Home / Self Care | Payer: Managed Care, Other (non HMO) | Source: Ambulatory Visit | Attending: Obstetrics and Gynecology | Admitting: Obstetrics and Gynecology

## 2020-10-31 ENCOUNTER — Ambulatory Visit: Payer: Managed Care, Other (non HMO)

## 2020-10-31 ENCOUNTER — Other Ambulatory Visit: Payer: Self-pay

## 2020-10-31 DIAGNOSIS — N938 Other specified abnormal uterine and vaginal bleeding: Secondary | ICD-10-CM | POA: Insufficient documentation

## 2020-10-31 DIAGNOSIS — E282 Polycystic ovarian syndrome: Secondary | ICD-10-CM | POA: Insufficient documentation

## 2020-11-01 ENCOUNTER — Other Ambulatory Visit: Admission: RE | Admit: 2020-11-01 | Payer: Managed Care, Other (non HMO) | Source: Ambulatory Visit

## 2020-11-01 ENCOUNTER — Emergency Department
Admission: EM | Admit: 2020-11-01 | Discharge: 2020-11-01 | Disposition: A | Discharge status: Home / Self Care | Payer: Managed Care, Other (non HMO) | Attending: Emergency Medicine | Admitting: Emergency Medicine

## 2020-11-01 ENCOUNTER — Other Ambulatory Visit: Payer: Self-pay | Admitting: Obstetrics and Gynecology

## 2020-11-01 ENCOUNTER — Telehealth: Payer: Self-pay | Admitting: Obstetrics and Gynecology

## 2020-11-01 ENCOUNTER — Other Ambulatory Visit: Payer: Self-pay

## 2020-11-01 DIAGNOSIS — R1031 Right lower quadrant pain: Secondary | ICD-10-CM | POA: Diagnosis present

## 2020-11-01 DIAGNOSIS — Z5321 Procedure and treatment not carried out due to patient leaving prior to being seen by health care provider: Secondary | ICD-10-CM | POA: Insufficient documentation

## 2020-11-01 LAB — CBC
HCT: 35.1 % — ABNORMAL LOW (ref 36.0–46.0)
Hemoglobin: 11.7 g/dL — ABNORMAL LOW (ref 12.0–15.0)
MCH: 28 pg (ref 26.0–34.0)
MCHC: 33.3 g/dL (ref 30.0–36.0)
MCV: 84 fL (ref 80.0–100.0)
Platelets: 329 10*3/uL (ref 150–400)
RBC: 4.18 MIL/uL (ref 3.87–5.11)
RDW: 15.3 % (ref 11.5–15.5)
WBC: 8.9 10*3/uL (ref 4.0–10.5)
nRBC: 0 % (ref 0.0–0.2)

## 2020-11-01 LAB — COMPREHENSIVE METABOLIC PANEL
ALT: 27 U/L (ref 0–44)
AST: 21 U/L (ref 15–41)
Albumin: 4 g/dL (ref 3.5–5.0)
Alkaline Phosphatase: 90 U/L (ref 38–126)
Anion gap: 5 (ref 5–15)
BUN: 11 mg/dL (ref 6–20)
CO2: 20 mmol/L — ABNORMAL LOW (ref 22–32)
Calcium: 8.7 mg/dL — ABNORMAL LOW (ref 8.9–10.3)
Chloride: 111 mmol/L (ref 98–111)
Creatinine, Ser: 0.76 mg/dL (ref 0.44–1.00)
GFR, Estimated: >60 mL/min (ref >60)
Glucose, Bld: 93 mg/dL (ref 70–99)
Potassium: 3.7 mmol/L (ref 3.5–5.1)
Sodium: 136 mmol/L (ref 135–145)
Total Bilirubin: 0.6 mg/dL (ref 0.3–1.2)
Total Protein: 8.1 g/dL (ref 6.5–8.1)

## 2020-11-01 LAB — LIPASE, BLOOD: Lipase: 25 U/L (ref 11–51)

## 2020-11-01 NOTE — ED Notes (Signed)
Pt to front desk stating that she is leaving because it is taking too long.

## 2020-11-01 NOTE — Telephone Encounter (Signed)
Pre-Admit called and stated that they tried several times through out the day to contact Katherine Huynh and was not able to get in touch with her.  They stated in Katherine Huynh's chart they noticed she was seen in the ED today for abdominal pain but left because it was taking too long.  They wanted to let Dr. Valentino Saxon know they moved her appointment to Thursday and will try to call her again.

## 2020-11-01 NOTE — ED Triage Notes (Signed)
Pt to ED POV for RLQ pain that started this morning. Denies N/V/D NAD noted.

## 2020-11-03 ENCOUNTER — Other Ambulatory Visit: Payer: Self-pay

## 2020-11-03 ENCOUNTER — Encounter
Admission: RE | Admit: 2020-11-03 | Discharge: 2020-11-03 | Disposition: A | Discharge status: Home / Self Care | Payer: Managed Care, Other (non HMO) | Source: Ambulatory Visit | Attending: Obstetrics and Gynecology | Admitting: Obstetrics and Gynecology

## 2020-11-03 ENCOUNTER — Other Ambulatory Visit: Payer: Managed Care, Other (non HMO)

## 2020-11-03 HISTORY — DX: Anemia, unspecified: D64.9

## 2020-11-03 NOTE — Patient Instructions (Signed)
Your procedure is scheduled on: 11/07/20 Report to [facility]. To find out your arrival time please call [phone] between 1PM - 3PM on 11/04/20.  Remember: Instructions that are not followed completely may result in serious medical risk, up to and including death, or upon the discretion of your surgeon and anesthesiologist your surgery may need to be rescheduled.     _X__ 1. Do not eat food after midnight the night before your procedure.                 No gum chewing or hard candies. You may drink clear liquids up to 2 hours                 before you are scheduled to arrive for your surgery- DO not drink clear                 liquids within 2 hours of the start of your surgery.                 Clear Liquids include:  water, apple juice without pulp, clear carbohydrate                 drink such as Clearfast or Gatorade, Black Coffee or Tea (Do not add                 anything to coffee or tea). Diabetics water only  __X__2.  On the morning of surgery brush your teeth with toothpaste and water, you                 may rinse your mouth with mouthwash if you wish.  Do not swallow any              toothpaste of mouthwash.     _X__ 3.  No Alcohol for 24 hours before or after surgery.   _X__ 4.  Do Not Smoke or use e-cigarettes For 24 Hours Prior to Your Surgery.                 Do not use any chewable tobacco products for at least 6 hours prior to                 surgery.  ____  5.  Bring all medications with you on the day of surgery if instructed.   __X__  6.  Notify your doctor if there is any change in your medical condition      (cold, fever, infections).     Do not wear jewelry, make-up, hairpins, clips or nail polish. Do not wear lotions, powders, or perfumes.  Do not shave 48 hours prior to surgery. Men may shave face and neck. Do not bring valuables to the hospital.    [facility] is not responsible for any belongings or  valuables.  Contacts, dentures/partials or body piercings may not be worn into surgery. Bring a case for your contacts, glasses or hearing aids, a denture cup will be supplied. Leave your suitcase in the car. After surgery it may be brought to your room. For patients admitted to the hospital, discharge time is determined by your treatment team.   Patients discharged the day of surgery will not be allowed to drive home.   Please read over the following fact sheets that you were given:     __X__ Take these medicines the morning of surgery with A SIP OF WATER:  1. propranolol (INDERAL) 10 MG tablet if needed  2.   3.   4.  5.  6.  ____ Fleet Enema (as directed)   ___ Use CHG Soap/SAGE wipes as directed  ____ Use inhalers on the day of surgery  ____ Stop metformin/Janumet/Farxiga 2 days prior to surgery    ____ Take 1/2 of usual insulin dose the night before surgery. No insulin the morning          of surgery.   ____ Stop Blood Thinners Coumadin/Plavix/Xarelto/Pleta/Pradaxa/Eliquis/Effient/Aspirin  on   Or contact your Surgeon, Cardiologist or Medical Doctor regarding  ability to stop your blood thinners  __X__ Stop Anti-inflammatories 7 days before surgery such as Advil, Ibuprofen, Motrin,  BC or Goodies Powder, Naprosyn, Naproxen, Aleve, Aspirin    __X__ Stop all herbal supplements, fish oil or vitamin E until after surgery.    ____ Bring [facility]-Pap to to [facility]

## 2020-11-04 ENCOUNTER — Other Ambulatory Visit
Admission: RE | Admit: 2020-11-04 | Discharge: 2020-11-04 | Disposition: A | Discharge status: Home / Self Care | Payer: Managed Care, Other (non HMO) | Source: Ambulatory Visit | Attending: Obstetrics and Gynecology | Admitting: Obstetrics and Gynecology

## 2020-11-04 DIAGNOSIS — Z01812 Encounter for preprocedural laboratory examination: Secondary | ICD-10-CM | POA: Diagnosis present

## 2020-11-04 LAB — PROTIME-INR
INR: 1 (ref 0.8–1.2)
Prothrombin Time: 13.3 seconds (ref 11.4–15.2)

## 2020-11-04 LAB — APTT: aPTT: 29 seconds (ref 24–36)

## 2020-11-06 LAB — VON WILLEBRAND PANEL
Coagulation Factor VIII: 119 % (ref 56–140)
Ristocetin Co-factor, Plasma: 89 % (ref 50–200)
Von Willebrand Antigen, Plasma: 125 % (ref 50–200)

## 2020-11-06 LAB — COAG STUDIES INTERP REPORT

## 2020-11-07 ENCOUNTER — Encounter
Admission: RE | Disposition: A | Discharge status: Home / Self Care | Payer: Self-pay | Source: Home / Self Care | Attending: Obstetrics and Gynecology

## 2020-11-07 ENCOUNTER — Encounter: Payer: Self-pay | Admitting: Obstetrics and Gynecology

## 2020-11-07 ENCOUNTER — Ambulatory Visit: Payer: Managed Care, Other (non HMO) | Admitting: Anesthesiology

## 2020-11-07 ENCOUNTER — Other Ambulatory Visit: Payer: Self-pay

## 2020-11-07 ENCOUNTER — Ambulatory Visit
Admission: RE | Admit: 2020-11-07 | Discharge: 2020-11-07 | Disposition: A | Discharge status: Home / Self Care | Payer: Managed Care, Other (non HMO) | Attending: Obstetrics and Gynecology | Admitting: Obstetrics and Gynecology

## 2020-11-07 DIAGNOSIS — Z6841 Body Mass Index (BMI) 40.0 and over, adult: Secondary | ICD-10-CM | POA: Diagnosis not present

## 2020-11-07 DIAGNOSIS — Z87891 Personal history of nicotine dependence: Secondary | ICD-10-CM | POA: Diagnosis not present

## 2020-11-07 DIAGNOSIS — E282 Polycystic ovarian syndrome: Secondary | ICD-10-CM | POA: Diagnosis not present

## 2020-11-07 DIAGNOSIS — Z881 Allergy status to other antibiotic agents status: Secondary | ICD-10-CM | POA: Insufficient documentation

## 2020-11-07 DIAGNOSIS — N938 Other specified abnormal uterine and vaginal bleeding: Secondary | ICD-10-CM | POA: Diagnosis present

## 2020-11-07 DIAGNOSIS — Z882 Allergy status to sulfonamides status: Secondary | ICD-10-CM | POA: Insufficient documentation

## 2020-11-07 HISTORY — PX: HYSTEROSCOPY WITH D & C: SHX1775

## 2020-11-07 LAB — POCT PREGNANCY, URINE: Preg Test, Ur: NEGATIVE

## 2020-11-07 SURGERY — DILATATION AND CURETTAGE /HYSTEROSCOPY
Anesthesia: General

## 2020-11-07 MED ORDER — POVIDONE-IODINE 10 % EX SWAB: 2.0000 "application " | Freq: Once | CUTANEOUS | Status: DC

## 2020-11-07 MED ORDER — MIDAZOLAM HCL 2 MG/2ML IJ SOLN
INTRAMUSCULAR | Status: AC
  Filled 2020-11-07: qty 2

## 2020-11-07 MED ORDER — 0.9 % SODIUM CHLORIDE (POUR BTL) OPTIME
TOPICAL | Status: DC | PRN
  Administered 2020-11-07: 500 mL

## 2020-11-07 MED ORDER — CHLORHEXIDINE GLUCONATE 0.12 % MT SOLN
15.0000 mL | Freq: Once | OROMUCOSAL | Status: AC
  Administered 2020-11-07: 15 mL via OROMUCOSAL

## 2020-11-07 MED ORDER — FENTANYL CITRATE (PF) 100 MCG/2ML IJ SOLN
25.0000 ug | INTRAMUSCULAR | Status: AC | PRN
  Administered 2020-11-07 (×4): 25 ug via INTRAVENOUS

## 2020-11-07 MED ORDER — ACETAMINOPHEN-CODEINE #3 300-30 MG PO TABS: 1.0000 | ORAL_TABLET | Freq: Four times a day (QID) | ORAL | 0 refills | Status: DC | PRN

## 2020-11-07 MED ORDER — SILVER NITRATE-POT NITRATE 75-25 % EX MISC
CUTANEOUS | Status: AC
  Filled 2020-11-07: qty 10

## 2020-11-07 MED ORDER — PROMETHAZINE HCL 25 MG/ML IJ SOLN
INTRAMUSCULAR | Status: AC
  Filled 2020-11-07: qty 1

## 2020-11-07 MED ORDER — FENTANYL CITRATE (PF) 100 MCG/2ML IJ SOLN
INTRAMUSCULAR | Status: AC
  Administered 2020-11-07: 25 ug via INTRAVENOUS
  Filled 2020-11-07: qty 2

## 2020-11-07 MED ORDER — PROPOFOL 10 MG/ML IV BOLUS
INTRAVENOUS | Status: DC | PRN
  Administered 2020-11-07: 200 mg via INTRAVENOUS

## 2020-11-07 MED ORDER — LIDOCAINE HCL (PF) 2 % IJ SOLN
INTRAMUSCULAR | Status: AC
  Filled 2020-11-07: qty 5

## 2020-11-07 MED ORDER — SUGAMMADEX SODIUM 500 MG/5ML IV SOLN
INTRAVENOUS | Status: DC | PRN
  Administered 2020-11-07: 350 mg via INTRAVENOUS

## 2020-11-07 MED ORDER — ONDANSETRON HCL 4 MG/2ML IJ SOLN
INTRAMUSCULAR | Status: DC | PRN
  Administered 2020-11-07: 4 mg via INTRAVENOUS

## 2020-11-07 MED ORDER — FENTANYL CITRATE (PF) 100 MCG/2ML IJ SOLN
INTRAMUSCULAR | Status: DC | PRN
  Administered 2020-11-07 (×2): 50 ug via INTRAVENOUS

## 2020-11-07 MED ORDER — LIDOCAINE HCL (CARDIAC) PF 100 MG/5ML IV SOSY
PREFILLED_SYRINGE | INTRAVENOUS | Status: DC | PRN
  Administered 2020-11-07: 100 mg via INTRAVENOUS

## 2020-11-07 MED ORDER — FENTANYL CITRATE (PF) 100 MCG/2ML IJ SOLN
INTRAMUSCULAR | Status: AC
  Filled 2020-11-07: qty 2

## 2020-11-07 MED ORDER — DEXAMETHASONE SODIUM PHOSPHATE 10 MG/ML IJ SOLN
INTRAMUSCULAR | Status: DC | PRN
  Administered 2020-11-07: 10 mg via INTRAVENOUS

## 2020-11-07 MED ORDER — CHLORHEXIDINE GLUCONATE 0.12 % MT SOLN
OROMUCOSAL | Status: AC
  Filled 2020-11-07: qty 15

## 2020-11-07 MED ORDER — ORAL CARE MOUTH RINSE: 15.0000 mL | Freq: Once | OROMUCOSAL | Status: AC

## 2020-11-07 MED ORDER — LIDOCAINE HCL (PF) 1 % IJ SOLN
INTRAMUSCULAR | Status: DC | PRN
  Administered 2020-11-07: 10 mL

## 2020-11-07 MED ORDER — LIDOCAINE HCL (PF) 1 % IJ SOLN
INTRAMUSCULAR | Status: AC
  Filled 2020-11-07: qty 30

## 2020-11-07 MED ORDER — DEXMEDETOMIDINE (PRECEDEX) IN NS 20 MCG/5ML (4 MCG/ML) IV SYRINGE
PREFILLED_SYRINGE | INTRAVENOUS | Status: AC
  Filled 2020-11-07: qty 5

## 2020-11-07 MED ORDER — SUGAMMADEX SODIUM 500 MG/5ML IV SOLN
INTRAVENOUS | Status: AC
  Filled 2020-11-07: qty 5

## 2020-11-07 MED ORDER — MIDAZOLAM HCL 2 MG/2ML IJ SOLN
INTRAMUSCULAR | Status: DC | PRN
  Administered 2020-11-07: 2 mg via INTRAVENOUS

## 2020-11-07 MED ORDER — ROCURONIUM BROMIDE 100 MG/10ML IV SOLN
INTRAVENOUS | Status: DC | PRN
  Administered 2020-11-07: 30 mg via INTRAVENOUS
  Administered 2020-11-07: 10 mg via INTRAVENOUS

## 2020-11-07 MED ORDER — LACTATED RINGERS IV SOLN: INTRAVENOUS | Status: DC

## 2020-11-07 MED ORDER — PHENYLEPHRINE HCL (PRESSORS) 10 MG/ML IV SOLN
INTRAVENOUS | Status: DC | PRN
  Administered 2020-11-07: 100 ug via INTRAVENOUS

## 2020-11-07 MED ORDER — DEXMEDETOMIDINE (PRECEDEX) IN NS 20 MCG/5ML (4 MCG/ML) IV SYRINGE
PREFILLED_SYRINGE | INTRAVENOUS | Status: DC | PRN
  Administered 2020-11-07: 20 ug via INTRAVENOUS

## 2020-11-07 MED ORDER — PROMETHAZINE HCL 25 MG/ML IJ SOLN
6.2500 mg | INTRAMUSCULAR | Status: DC | PRN
  Administered 2020-11-07: 6.25 mg via INTRAVENOUS

## 2020-11-07 MED ORDER — SUCCINYLCHOLINE CHLORIDE 20 MG/ML IJ SOLN
INTRAMUSCULAR | Status: DC | PRN
  Administered 2020-11-07: 200 mg via INTRAVENOUS

## 2020-11-07 MED ORDER — DEXAMETHASONE SODIUM PHOSPHATE 10 MG/ML IJ SOLN
INTRAMUSCULAR | Status: AC
  Filled 2020-11-07: qty 1

## 2020-11-07 MED ORDER — ONDANSETRON HCL 4 MG/2ML IJ SOLN
INTRAMUSCULAR | Status: AC
  Filled 2020-11-07: qty 2

## 2020-11-07 SURGICAL SUPPLY — 24 items
BACTOSHIELD CHG 4% 4OZ (MISCELLANEOUS) ×2
BAG INFUSER PRESSURE 100CC (MISCELLANEOUS) ×3 IMPLANT
CATH ROBINSON RED A/P 16FR (CATHETERS) ×3 IMPLANT
COVER WAND RF STERILE (DRAPES) IMPLANT
DRSG TELFA 4X3 1S NADH ST (GAUZE/BANDAGES/DRESSINGS) ×3 IMPLANT
GAUZE 4X4 16PLY ~~LOC~~+RFID DBL (SPONGE) ×3 IMPLANT
GLOVE SURG ENC MOIS LTX SZ6.5 (GLOVE) ×3 IMPLANT
GLOVE SURG UNDER LTX SZ7 (GLOVE) ×3 IMPLANT
GOWN STRL REUS W/ TWL LRG LVL3 (GOWN DISPOSABLE) ×2 IMPLANT
GOWN STRL REUS W/TWL LRG LVL3 (GOWN DISPOSABLE) ×6
IV LACTATED RINGER IRRG 3000ML (IV SOLUTION) ×3
IV LACTATED RINGERS 1000ML (IV SOLUTION) ×3 IMPLANT
IV LR IRRIG 3000ML ARTHROMATIC (IV SOLUTION) ×1 IMPLANT
KIT PROCEDURE FLUENT (KITS) IMPLANT
KIT TURNOVER CYSTO (KITS) ×3 IMPLANT
MANIFOLD NEPTUNE II (INSTRUMENTS) ×3 IMPLANT
NEEDLE SPNL 22GX3.5 QUINCKE BK (NEEDLE) ×3 IMPLANT
PACK DNC HYST (MISCELLANEOUS) ×3 IMPLANT
PAD OB MATERNITY 4.3X12.25 (PERSONAL CARE ITEMS) ×3 IMPLANT
PAD PREP 24X41 OB/GYN DISP (PERSONAL CARE ITEMS) ×3 IMPLANT
SCRUB CHG 4% DYNA-HEX 4OZ (MISCELLANEOUS) ×1 IMPLANT
SOL PREP PVP 2OZ (MISCELLANEOUS) ×3
SOLUTION PREP PVP 2OZ (MISCELLANEOUS) ×1 IMPLANT
SURGILUBE 2OZ TUBE FLIPTOP (MISCELLANEOUS) ×3 IMPLANT

## 2020-11-07 NOTE — Anesthesia Procedure Notes (Signed)
Procedure Name: Intubation Date/Time: 11/07/2020 2:06 PM Performed by: Henrietta Hoover, CRNA Pre-anesthesia Checklist: Patient identified, Patient being monitored, Timeout performed, Emergency Drugs available and Suction available Patient Re-evaluated:Patient Re-evaluated prior to induction Oxygen Delivery Method: Circle system utilized Preoxygenation: Pre-oxygenation with 100% oxygen Induction Type: IV induction Ventilation: Mask ventilation without difficulty Laryngoscope Size: 3 and McGraph Grade View: Grade I Tube type: Oral Tube size: 7.0 mm Number of attempts: 1 Airway Equipment and Method: Stylet Placement Confirmation: ETT inserted through vocal cords under direct vision, positive ETCO2 and breath sounds checked- equal and bilateral Secured at: 21 cm Tube secured with: Tape Dental Injury: Teeth and Oropharynx as per pre-operative assessment

## 2020-11-07 NOTE — Transfer of Care (Signed)
Immediate Anesthesia Transfer of Care Note  Patient: Katherine Huynh  Procedure(s) Performed: DILATATION AND CURETTAGE /HYSTEROSCOPY  Patient Location: PACU  Anesthesia Type:General  Level of Consciousness: sedated  Airway & Oxygen Therapy: Patient Spontanous Breathing and Patient connected to face mask oxygen  Post-op Assessment: Report given to RN and Post -op Vital signs reviewed and stable  Post vital signs: Reviewed and stable  Last Vitals:  Vitals Value Taken Time  BP 109/64 11/07/20 1504  Temp 35.9 C 11/07/20 1505  Pulse 96 11/07/20 1509  Resp 11 11/07/20 1509  SpO2 100 % 11/07/20 1509  Vitals shown include unvalidated device data.  Last Pain:  Vitals:   11/07/20 1504  TempSrc:   PainSc: 8          Complications: No notable events documented.

## 2020-11-07 NOTE — Discharge Instructions (Addendum)

## 2020-11-07 NOTE — Anesthesia Preprocedure Evaluation (Signed)
Anesthesia Evaluation  Patient identified by MRN, date of birth, ID band Patient awake    Reviewed: Allergy & Precautions, H&P , NPO status , Patient's Chart, lab work & pertinent test results, reviewed documented beta blocker date and time   History of Anesthesia Complications Negative for: history of anesthetic complications  Airway Mallampati: I  TM Distance: >3 FB Neck ROM: full    Dental  (+) Dental Advidsory Given, Teeth Intact   Pulmonary neg shortness of breath, asthma , neg sleep apnea, neg COPD, neg recent URI, former smoker,    Pulmonary exam normal breath sounds clear to auscultation       Cardiovascular Exercise Tolerance: Good (-) hypertension(-) angina(-) Past MI and (-) Cardiac Stents Normal cardiovascular exam(-) dysrhythmias + Valvular Problems/Murmurs  Rhythm:regular Rate:Normal     Neuro/Psych PSYCHIATRIC DISORDERS Anxiety Depression negative neurological ROS     GI/Hepatic negative GI ROS, Neg liver ROS,   Endo/Other  neg diabetesMorbid obesity  Renal/GU negative Renal ROS  negative genitourinary   Musculoskeletal   Abdominal   Peds  Hematology  (+) Blood dyscrasia, anemia ,   Anesthesia Other Findings Past Medical History: No date: Anemia No date: Anxiety No date: Bilateral swelling of feet No date: Constipation No date: Depression 09/15/2015: Fam hx-ischem heart disease No date: Heart murmur No date: Infertility, female 02/17/2016: Morbid obesity (HCC) No date: Multiple food allergies No date: Other fatigue No date: Polycystic disease, ovaries No date: Prediabetes No date: Shortness of breath on exertion No date: Vitamin D deficiency   Reproductive/Obstetrics negative OB ROS                             Anesthesia Physical Anesthesia Plan  ASA: 3  Anesthesia Plan: General   Post-op Pain Management:    Induction: Intravenous  PONV Risk Score and  Plan: 3 and Ondansetron, Dexamethasone, Midazolam, Treatment may vary due to age or medical condition and Promethazine  Airway Management Planned: LMA and Oral ETT  Additional Equipment:   Intra-op Plan:   Post-operative Plan: Extubation in OR  Informed Consent: I have reviewed the patients History and Physical, chart, labs and discussed the procedure including the risks, benefits and alternatives for the proposed anesthesia with the patient or authorized representative who has indicated his/her understanding and acceptance.     Dental Advisory Given  Plan Discussed with: Anesthesiologist, CRNA and Surgeon  Anesthesia Plan Comments:         Anesthesia Quick Evaluation

## 2020-11-07 NOTE — Op Note (Signed)
Procedure(s): DILATATION AND CURETTAGE /HYSTEROSCOPY Procedure Note  Katherine Huynh female 25 y.o. 11/07/2020  Indications: The patient is a 25 y.o. G2P0020 morbidly obese female with PCOS, dysfunctional uterine bleeding, refractory to oral therapy (combined OCPs,  Provera, Aygestin, and Megace)  Pre-operative Diagnosis: Dysfunctional uterine bleeding, PCOS, morbid obesity (BMI 60).   Post-operative Diagnosis: [name]  Surgeon: Hildred Laser, MD  Assistants:  None.   Anesthesia: General endotracheal anesthesia  Findings: The uterus was sounded to 9 cm. Moderately proliferative endometrium present. No intrauterine masses noted. Unable to visualize tubal ostia bilaterally due to presence of blood and proliferative tissue.   Procedure Details: The patient was seen in the Holding Room. The risks, benefits, complications, treatment options, and expected outcomes were discussed with the patient.  The patient concurred with the proposed plan, giving informed consent.  The site of surgery properly noted/marked. The patient was taken to the Operating Room, identified as Katherine Huynh and the procedure verified as Procedure(s) (LRB): DILATATION AND CURETTAGE /HYSTEROSCOPY (N/A). A Time Out was held and the above information confirmed.  The patient was then placed under general anesthesia without difficulty.  She was then prepped and draped in the normal sterile fashion, and placed in the dorsal lithotomy position.  A time out was performed.  An exam under anesthesia was performed with the findings noted above.  Straight catheterization was performed. A sterile speculum was inserted into vagina. A single-tooth tenaculum was used to grasp the anterior lip of the cervix. The cervix was injected circumferentially with 10 ml of 1% lidocaine. Cervical dilation was performed. A 5 mm hysteroscope was introduced into the uterus under direct visualization. The cavity was allowed to fill, and then the entire cavity  was explored with the findings described above. The hysteroscope was removed, and a sharp curette was then passed into the uterus and endometrial sampling was collected for pathology. The tenaculum was then removed from the cervix. Hemostasis was observed.  All instrument and sponge counts were correct at the end of the procedure x 2.  The patient tolerated the procedure well.  She was awakened and taken to the PACU in stable condition.    Estimated Blood Loss:  > 5 ml      Drains: straight catheterization prior to procedure with 50 ml of clear urine         Total IV Fluids:  600 ml  Specimens: Endometrial curettings         Implants: None         Complications:  None; patient tolerated the procedure well.         Disposition: PACU - hemodynamically stable.         Condition: stable   Hildred Laser, MD [facility]

## 2020-11-07 NOTE — H&P (Signed)
GYNECOLOGY PREOPERATIVE HISTORY AND PHYSICAL   Subjective:  Katherine Huynh is a 25 y.o. G2P0020 morbidly obese female here for surgical management of dysfunctional uterine bleeding.  She has a history of PCOS and irregular cycles. She has been bleeding continuously since April 2022. She has been seen in the Emergency Room twice for her bleeding over the past month.  Indications for procedure include: persistent uterine bleeding with failed medical management (have tried cyclic and continuous OCPs, Provera, Aygestin, and Megace). Attempted to perform endometrial biopsy in the office but unable to pass pipelle into uterine cavity.   Proposed surgery: Hysteroscopy D&C    Pertinent Gynecological History: Bleeding: dysfunctional uterine bleeding Contraception: none Last pap: normal Date: 10/17/2017   Past Medical History:  Diagnosis Date   Anemia    Anxiety    Bilateral swelling of feet    Constipation    Depression    Fam hx-ischem heart disease 09/15/2015   Heart murmur    Infertility, female    Morbid obesity (HCC) 02/17/2016   Multiple food allergies    Other fatigue    Polycystic disease, ovaries    Prediabetes    Shortness of breath on exertion    Vitamin D deficiency     Past Surgical History:  Procedure Laterality Date   DG HYSTEROGRAM (HSD)  01/13/2020       NO PAST SURGERIES      OB History  Gravida Para Term Preterm AB Living  2       2    SAB IAB Ectopic Multiple Live Births  2            # Outcome Date GA Lbr Len/2nd Weight Sex Delivery Anes PTL Lv  2 SAB           1 SAB             Family History  Problem Relation Age of Onset   Obesity Mother    Hypertension Father    Hyperlipidemia Father    Sleep apnea Father    Obesity Father    Cancer Maternal Grandmother        breast   Fibroids Maternal Grandmother    Heart disease Maternal Grandfather    Heart disease Paternal Grandfather    Alcohol abuse Sister    ADD / ADHD Sister     Social  History   Socioeconomic History   Marital status: Significant Other    Spouse name: [name]    Number of children: 0   Years of education: Not on file   Highest education level: High school graduate  Occupational History   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  Tobacco Use   Smoking status: Former    Pack years: 0.00    Types: Cigarettes    Quit date: 09/28/2016    Years since quitting: 4.1   Smokeless tobacco: Never  Vaping Use   Vaping Use: Some days   Substances: Nicotine, Flavoring  Substance and Sexual Activity   Alcohol use: Yes    Alcohol/week: 6.0 standard drinks    Types: 3 Glasses of wine, 2 Cans of beer, 1 Shots of liquor per week    Comment: occas   Drug use: Yes    Types: Marijuana    Comment: last used about a month ago   Sexual activity: Yes    Partners: Male    Birth control/protection: Pill  Other Topics Concern   Not on file  Social History Narrative  Lives with fiancee, they live together, works full time.    Social Determinants of Health   Financial Resource Strain: Not on file  Food Insecurity: Not on file  Transportation Needs: Not on file  Physical Activity: Not on file  Stress: Not on file  Social Connections: Not on file  Intimate Partner Violence: Not on file    No current facility-administered medications on file prior to encounter.   Current Outpatient Medications on File Prior to Encounter  Medication Sig Dispense Refill   diphenhydrAMINE (BENADRYL) 25 mg capsule Take 25 mg by mouth 2 (two) times daily.     diphenhydrAMINE HCl, Sleep, (ZZZQUIL) 25 MG CAPS Take 25 mg by mouth at bedtime as needed (sleep).     ibuprofen (ADVIL) 200 MG tablet Take 400 mg by mouth every 6 (six) hours as needed for headache or moderate pain.     megestrol (MEGACE) 40 MG tablet Take 1 tablet (40 mg total) by mouth 2 (two) times daily. Can increase to two tablets twice a day in the event of heavy bleeding (Patient taking differently: Take 40-80 mg by mouth 2 (two)  times daily. Take 40 mg twice daily, increase to 80 mg twice daily in the event of heavy bleeding) 60 tablet 5   Multiple Vitamin (MULTIVITAMIN WITH MINERALS) TABS tablet Take 1 tablet by mouth daily.     oxymetazoline (AFRIN NODRIP SEVERE CONGEST) 0.05 % nasal spray Place 1 spray into both nostrils 3 (three) times daily as needed for congestion.     propranolol (INDERAL) 10 MG tablet Take 1 tablet up to three times a day if needed for severe anxiety. (Patient taking differently: Take 10 mg by mouth 3 (three) times daily as needed (anxiety).) 90 tablet 0   Simethicone (GAS RELIEF PO) Take 1 tablet by mouth daily as needed (gas).     Vitamin D, Ergocalciferol, (DRISDOL) 1.25 MG (50000 UNIT) CAPS capsule Take 1 capsule (50,000 Units total) by mouth every 7 (seven) days. (Patient taking differently: Take 50,000 Units by mouth every Wednesday.) 4 capsule 0   naproxen (NAPROSYN) 500 MG tablet Take 1 tablet (500 mg total) by mouth 2 (two) times daily with a meal. 20 tablet 0   ondansetron (ZOFRAN) 4 MG tablet Take 1 tablet (4 mg total) by mouth every 8 (eight) hours as needed for nausea or vomiting. 20 tablet 0    Allergies  Allergen Reactions   Cinnamon Itching   Erythromycin Rash   Sulfa Antibiotics Rash     Review of Systems Constitutional: No recent fever/chills/sweats Respiratory: No recent cough/bronchitis Cardiovascular: No chest pain Gastrointestinal: No recent nausea/vomiting/diarrhea Genitourinary: No UTI symptoms Hematologic/lymphatic:No history of coagulopathy or recent blood thinner use    Objective:   Blood pressure 128/84, pulse (!) 103, temperature 99.1 F (37.3 C), temperature source Oral, resp. rate 18, SpO2 99 %. CONSTITUTIONAL: Well-developed, well-nourished female in no acute distress.  HENT:  Normocephalic, atraumatic, External right and left ear normal. Oropharynx is clear and moist EYES: Conjunctivae and EOM are normal. Pupils are equal, round, and reactive to light.  No scleral icterus.  NECK: Normal range of motion, supple, no masses SKIN: Skin is warm and dry. No rash noted. Not diaphoretic. No erythema. No pallor. NEUROLOGIC: Alert and oriented to person, place, and time. Normal reflexes, muscle tone coordination. No cranial nerve deficit noted. PSYCHIATRIC: Normal mood and affect. Normal behavior. Normal judgment and thought content. CARDIOVASCULAR: Normal heart rate noted, regular rhythm RESPIRATORY: Effort and breath sounds normal, no  problems with respiration noted ABDOMEN: Soft, nontender, nondistended. PELVIC: Deferred MUSCULOSKELETAL: Normal range of motion. No edema and no tenderness. 2+ distal pulses.    Labs: Results for orders placed or performed during the hospital encounter of 11/07/20 (from the past 336 hour(s))  Pregnancy, urine POC   Collection Time: 11/07/20  1:01 PM  Result Value Ref Range   Preg Test, Ur NEGATIVE NEGATIVE  Results for orders placed or performed during the hospital encounter of 11/04/20 (from the past 336 hour(s))  Protime-INR   Collection Time: 11/04/20 10:43 AM  Result Value Ref Range   Prothrombin Time 13.3 11.4 - 15.2 seconds   INR 1.0 0.8 - 1.2  APTT   Collection Time: 11/04/20 10:43 AM  Result Value Ref Range   aPTT 29 24 - 36 seconds  Von Willebrand panel   Collection Time: 11/04/20 10:50 AM  Result Value Ref Range   Coagulation Factor VIII 119 56 - 140 %   Ristocetin Co-factor, Plasma 89 50 - 200 %   Von Willebrand Antigen, Plasma 125 50 - 200 %  Coag Studies Interp Report   Collection Time: 11/04/20 10:50 AM  Result Value Ref Range   Interpretation Note   Results for orders placed or performed during the hospital encounter of 11/01/20 (from the past 336 hour(s))  Lipase, blood   Collection Time: 11/01/20  2:47 PM  Result Value Ref Range   Lipase 25 11 - 51 U/L  Comprehensive metabolic panel   Collection Time: 11/01/20  2:47 PM  Result Value Ref Range   Sodium 136 135 - 145 mmol/L    Potassium 3.7 3.5 - 5.1 mmol/L   Chloride 111 98 - 111 mmol/L   CO2 20 (L) 22 - 32 mmol/L   Glucose, Bld 93 70 - 99 mg/dL   BUN 11 6 - 20 mg/dL   Creatinine, Ser 5.28 0.44 - 1.00 mg/dL   Calcium 8.7 (L) 8.9 - 10.3 mg/dL   Total Protein 8.1 6.5 - 8.1 g/dL   Albumin 4.0 3.5 - 5.0 g/dL   AST 21 15 - 41 U/L   ALT 27 0 - 44 U/L   Alkaline Phosphatase 90 38 - 126 U/L   Total Bilirubin 0.6 0.3 - 1.2 mg/dL   GFR, Estimated >41 >32 mL/min   Anion gap 5 5 - 15  CBC   Collection Time: 11/01/20  2:47 PM  Result Value Ref Range   WBC 8.9 4.0 - 10.5 K/uL   RBC 4.18 3.87 - 5.11 MIL/uL   Hemoglobin 11.7 (L) 12.0 - 15.0 g/dL   HCT 44.0 (L) 10.2 - 72.5 %   MCV 84.0 80.0 - 100.0 fL   MCH 28.0 26.0 - 34.0 pg   MCHC 33.3 30.0 - 36.0 g/dL   RDW 36.6 44.0 - 34.7 %   Platelets 329 150 - 400 K/uL   nRBC 0.0 0.0 - 0.2 %     Imaging Studies: CT ABDOMEN PELVIS WO CONTRAST  Result Date: 10/14/2020 CLINICAL DATA:  Abdominal pain and vaginal bleeding EXAM: CT ABDOMEN AND PELVIS WITHOUT CONTRAST TECHNIQUE: Multidetector CT imaging of the abdomen and pelvis was performed following the standard protocol without oral or IV contrast. COMPARISON:  None. FINDINGS: Lower chest: Lung bases are clear. Hepatobiliary: No focal liver lesions are appreciable. Gallbladder wall is not appreciably thickened. There is no biliary duct dilatation. Pancreas: There is no pancreatic mass or inflammatory focus. Spleen: No splenic lesions are evident. Adrenals/Urinary Tract: Adrenals bilaterally appear normal. No evident  renal mass or hydronephrosis on either side. There is no appreciable renal or ureteral calculus on either side. Urinary bladder is midline with wall thickness within normal limits. Stomach/Bowel: There is no appreciable bowel wall or mesenteric thickening. No evident bowel obstruction. Terminal ileum appears normal. Appendix appears normal. There is no evident free air or portal venous air. Vascular/Lymphatic: No  abdominal aortic aneurysm. No vascular lesions evident noncontrast enhanced study. There is a right inguinal lymph node measuring 1.4 x 1.3 cm. There are several subcentimeter inguinal lymph nodes as well. No other areas of lymph node prominence evident. Reproductive: The uterus is anteverted. There is no intrauterine mass. There is no endometrial/intrauterine air. Endometrium appears prominent measuring 19 mm in thickness. There is a right ovarian cyst measuring 4.0 x 3.5 x 3.8 cm. No other adnexal mass evident. Other: No evident abscess or ascites in the abdomen or pelvis. Musculoskeletal: No blastic or lytic bone lesions. No intramuscular or abdominal wall lesions are evident. IMPRESSION: 1. Endometrium is rather prominent. Endometrial or myometrial air evident. No intrauterine mass evident on noncontrast enhanced study. Prominence of the endometrium may warrant pelvic ultrasound to further evaluate. 2. Right ovarian cyst measuring 4.0 x 3.5 x 3.8 cm. No follow-up imaging recommended with respect to this cyst per consensus guidelines. Note: This recommendation does not apply to premenarchal patients and to those with increased risk (genetic, family history, elevated tumor markers or other high-risk factors) of ovarian cancer. Reference: JACR 2020 Feb; 17(2):248-254 3. No bowel wall thickening or bowel obstruction. No abscess in the abdomen or pelvis. Appendix appears normal. 4. No evident renal or ureteral calculus. No hydronephrosis. Urinary bladder wall thickness normal. 5. Prominent right inguinal lymph node of uncertain etiology. Suspect reactive etiology as most likely. Several subcentimeter inguinal lymph elsewhere noted. Electronically Signed   By: Bretta BangWilliam  Woodruff III M.D.   On: 10/14/2020 19:09   US PELVIS TRANSVAGINAL NON-OB (TV ONLY)  Result Date: 10/31/2020 CLINICAL DATA:  Initial evaluation for dysfunctional uterine bleeding, PCOS. EXAM: TRANSABDOMINAL AND TRANSVAGINAL ULTRASOUND OF PELVIS  TECHNIQUE: Both transabdominal and transvaginal ultrasound examinations of the pelvis were performed. Transabdominal technique was performed for global imaging of the pelvis including uterus, ovaries, adnexal regions, and pelvic cul-de-sac. It was necessary to proceed with endovaginal exam following the transabdominal exam to visualize the uterus, endometrium, and ovaries. COMPARISON:  Prior ultrasound from 10/14/2020. FINDINGS: Uterus Measurements: 9.3 x 4.9 x 5.7 cm = volume: 136 mL. Uterus is anteverted. 0.9 x 1.1 x 0.8 cm hypoechoic lesion at the posterior uterine body consistent with a small intramural fibroid (image 61). Endometrium Thickness: 17.3 mm. Endometrial complex is heterogeneous in appearance with associated vascularity. No other focal abnormality. Right ovary Measurements: 6.5 x 2.0 x 2.4 cm = volume: 17.0 mL. 4.2 x 3.6 x 3.7 cm cyst again seen within the right ovary, consistent with an exophytic versus paraovarian cyst. Lesion is simple in appearance without significant internal complexity. No vascularity or solid component. This is little interval changed in size, previously measuring 4.3 cm. Left ovary Measurements: 2.9 x 1.6 x 1.8 cm = volume: 4.0 mL. Normal appearance/no adnexal mass. Other findings Trace free fluid noted within the pelvis. IMPRESSION: 1. Thickened endometrial stripe measuring up to 17.3 mm with associated vascularity. If bleeding remains unresponsive to hormonal or medical therapy, focal lesion work-up with sonohysterogram should be considered. Endometrial biopsy should also be considered in pre-menopausal patients at high risk for endometrial carcinoma. (Ref: Radiological Reasoning: Algorithmic Workup of Abnormal Vaginal Bleeding with  Endovaginal Sonography and Sonohysterography. AJR 2008; 778:E42-35). 2. No significant interval change in size and appearance of 4.2 cm simple right adnexal cyst. This has benign features, with no follow up imaging recommended. Note: This  recommendation does not apply to premenarchal patients or to those with increased risk (genetic, family history, elevated tumor markers or other high-risk factors) of ovarian cancer. Reference: Radiology 2019 Nov; 293(2):359-371. 3. 1.1 cm intramural fibroid at the posterior uterine body. Electronically Signed   By: Rise Mu M.D.   On: 10/31/2020 21:05   US PELVIS (TRANSABDOMINAL ONLY)  Result Date: 10/31/2020 CLINICAL DATA:  Initial evaluation for dysfunctional uterine bleeding, PCOS. EXAM: TRANSABDOMINAL AND TRANSVAGINAL ULTRASOUND OF PELVIS TECHNIQUE: Both transabdominal and transvaginal ultrasound examinations of the pelvis were performed. Transabdominal technique was performed for global imaging of the pelvis including uterus, ovaries, adnexal regions, and pelvic cul-de-sac. It was necessary to proceed with endovaginal exam following the transabdominal exam to visualize the uterus, endometrium, and ovaries. COMPARISON:  Prior ultrasound from 10/14/2020. FINDINGS: Uterus Measurements: 9.3 x 4.9 x 5.7 cm = volume: 136 mL. Uterus is anteverted. 0.9 x 1.1 x 0.8 cm hypoechoic lesion at the posterior uterine body consistent with a small intramural fibroid (image 61). Endometrium Thickness: 17.3 mm. Endometrial complex is heterogeneous in appearance with associated vascularity. No other focal abnormality. Right ovary Measurements: 6.5 x 2.0 x 2.4 cm = volume: 17.0 mL. 4.2 x 3.6 x 3.7 cm cyst again seen within the right ovary, consistent with an exophytic versus paraovarian cyst. Lesion is simple in appearance without significant internal complexity. No vascularity or solid component. This is little interval changed in size, previously measuring 4.3 cm. Left ovary Measurements: 2.9 x 1.6 x 1.8 cm = volume: 4.0 mL. Normal appearance/no adnexal mass. Other findings Trace free fluid noted within the pelvis. IMPRESSION: 1. Thickened endometrial stripe measuring up to 17.3 mm with associated vascularity. If  bleeding remains unresponsive to hormonal or medical therapy, focal lesion work-up with sonohysterogram should be considered. Endometrial biopsy should also be considered in pre-menopausal patients at high risk for endometrial carcinoma. (Ref: Radiological Reasoning: Algorithmic Workup of Abnormal Vaginal Bleeding with Endovaginal Sonography and Sonohysterography. AJR 2008; 361:W43-15). 2. No significant interval change in size and appearance of 4.2 cm simple right adnexal cyst. This has benign features, with no follow up imaging recommended. Note: This recommendation does not apply to premenarchal patients or to those with increased risk (genetic, family history, elevated tumor markers or other high-risk factors) of ovarian cancer. Reference: Radiology 2019 Nov; 293(2):359-371. 3. 1.1 cm intramural fibroid at the posterior uterine body. Electronically Signed   By: Rise Mu M.D.   On: 10/31/2020 21:05   US PELVIC COMPLETE WITH TRANSVAGINAL  Result Date: 10/14/2020 CLINICAL DATA:  Pelvic pain post uterine biopsy 3 days ago EXAM: TRANSABDOMINAL AND TRANSVAGINAL ULTRASOUND OF PELVIS TECHNIQUE: Both transabdominal and transvaginal ultrasound examinations of the pelvis were performed. Transabdominal technique was performed for global imaging of the pelvis including uterus, ovaries, adnexal regions, and pelvic cul-de-sac. It was necessary to proceed with endovaginal exam following the transabdominal exam to visualize the ovaries. COMPARISON:  05/22/2018 FINDINGS: Uterus Measurements: 7.1 x 4.2 x 5.4 cm = volume: 82 mL. No fibroids or other mass visualized. Endometrium Thickness: 15 mm in thickness.  No focal abnormality visualized. Right ovary Measurements: 6.8 x 3.0 x 2.3 cm = volume: 26 mL. 4.3 cm simple appearing cyst adjacent to the right ovary, likely paraovarian cyst. This measured up 2 2.3 cm previously. Left ovary  Measurements: 3.7 x 2.0 x 1.7 cm = volume: 6.5 mL. Normal appearance/no adnexal mass.  Other findings No abnormal free fluid. IMPRESSION: Endometrium upper limits normal in thickness at 15 mm. No focal abnormality. 4.3 cm right paraovarian cyst which appears simple. This has enlarged since prior study when this measured 2.3 cm. Electronically Signed   By: Charlett Nose M.D.   On: 10/14/2020 20:42    Assessment:    Dysfunctional Uterine bleeding PCOS Irregular menses Morbid obesity  Plan:   - Counseling: Procedure, risks, reasons, benefits and complications (including injury to bowel, bladder, major blood vessel, ureter, bleeding, possibility of transfusion, infection, or fistula formation) reviewed in detail. Likelihood of success in alleviating the patient's condition was discussed. Routine postoperative instructions will be reviewed with the patient and her family in detail after surgery.  The patient concurred with the proposed plan, giving informed written consent for the surgery.   Preop testing reviewed. Bleeding studies ordered, were normal. Patient has been NPO since midnight.  Will be advised to continue Megace after surgery. Patient is at high risk for endometrial hyperplasia or malignancy due to BMI and in setting of persistent abnormal bleeding.    Hildred Laser, MD [facility]

## 2020-11-08 ENCOUNTER — Encounter: Payer: Self-pay | Admitting: Obstetrics and Gynecology

## 2020-11-08 NOTE — Anesthesia Postprocedure Evaluation (Signed)
Anesthesia Post Note  Patient: Katherine Huynh  Procedure(s) Performed: DILATATION AND CURETTAGE /HYSTEROSCOPY  Patient location during evaluation: PACU Anesthesia Type: General Level of consciousness: awake and alert and oriented Pain management: pain level controlled Vital Signs Assessment: post-procedure vital signs reviewed and stable Respiratory status: spontaneous breathing, nonlabored ventilation and respiratory function stable Cardiovascular status: blood pressure returned to baseline and stable Postop Assessment: no signs of nausea or vomiting Anesthetic complications: no   No notable events documented.   Last Vitals:  Vitals:   11/07/20 1615 11/07/20 1635  BP: 102/67 124/77  Pulse: 92 (!) 102  Resp: (!) 0 20  Temp: 37.2 C 37 C  SpO2: 93% 98%    Last Pain:  Vitals:   11/07/20 1635  TempSrc: Temporal  PainSc: 2                  Niklas Chretien

## 2020-11-09 ENCOUNTER — Other Ambulatory Visit: Payer: Self-pay | Admitting: Obstetrics and Gynecology

## 2020-11-09 LAB — SURGICAL PATHOLOGY

## 2020-11-09 MED ORDER — DOXYCYCLINE HYCLATE 100 MG PO CAPS: 100.0000 mg | ORAL_CAPSULE | Freq: Two times a day (BID) | ORAL | 0 refills | Status: DC

## 2020-11-15 ENCOUNTER — Other Ambulatory Visit (HOSPITAL_COMMUNITY)
Admission: RE | Admit: 2020-11-15 | Discharge: 2020-11-15 | Disposition: A | Discharge status: Home / Self Care | Payer: Managed Care, Other (non HMO) | Source: Ambulatory Visit | Attending: Obstetrics and Gynecology | Admitting: Obstetrics and Gynecology

## 2020-11-15 ENCOUNTER — Other Ambulatory Visit: Payer: Self-pay

## 2020-11-15 ENCOUNTER — Ambulatory Visit (INDEPENDENT_AMBULATORY_CARE_PROVIDER_SITE_OTHER): Payer: Managed Care, Other (non HMO) | Admitting: Obstetrics and Gynecology

## 2020-11-15 ENCOUNTER — Encounter: Payer: Self-pay | Admitting: Obstetrics and Gynecology

## 2020-11-15 VITALS — BP 140/88 | HR 79 | Ht 63.0 in | Wt 324.2 lb

## 2020-11-15 DIAGNOSIS — Z9889 Other specified postprocedural states: Secondary | ICD-10-CM

## 2020-11-15 DIAGNOSIS — N719 Inflammatory disease of uterus, unspecified: Secondary | ICD-10-CM

## 2020-11-15 DIAGNOSIS — Z124 Encounter for screening for malignant neoplasm of cervix: Secondary | ICD-10-CM

## 2020-11-15 DIAGNOSIS — Z09 Encounter for follow-up examination after completed treatment for conditions other than malignant neoplasm: Secondary | ICD-10-CM

## 2020-11-15 DIAGNOSIS — N939 Abnormal uterine and vaginal bleeding, unspecified: Secondary | ICD-10-CM

## 2020-11-15 MED ORDER — IBUPROFEN 800 MG PO TABS: 800.0000 mg | ORAL_TABLET | Freq: Three times a day (TID) | ORAL | 0 refills | Status: DC | PRN

## 2020-11-15 MED ORDER — TRAMADOL HCL 50 MG PO TABS: 50.0000 mg | ORAL_TABLET | Freq: Four times a day (QID) | ORAL | 0 refills | Status: DC | PRN

## 2020-11-15 NOTE — Progress Notes (Addendum)
    OBSTETRICS/GYNECOLOGY POST-OPERATIVE CLINIC VISIT  Subjective:     Katherine Huynh is a 25 y.o. G99P0020 female who presents to the clinic 8 days status post diagnostic hysteroscopy with dilation and curettage for abnormal uterine bleeding. Eating a regular diet without difficulty. Bowel movements are normal. Pain is not well controlled.  Medications being used: acetaminophen and narcotic analgesics including codeine/acetaminophen (Tylenol w/ Codeine). She is still noting some light bleeding, occasionally becomes moderate. However overall much better than before.   The following portions of the patient's history were reviewed and updated as appropriate: allergies, current medications, past family history, past medical history, past social history, past surgical history, and problem list.  Review of Systems Pertinent items noted in HPI and remainder of comprehensive ROS otherwise negative.    Objective:    BP 140/88   Pulse 79   Ht 5\' 3"  (1.6 m)   Wt (!) 324 lb 3.2 oz (147.1 kg)   BMI 57.43 kg/m  General:  alert and no distress  Abdomen: soft, bowel sounds active, non-tender  Pelvis:   external genitalia normal, rectovaginal septum normal.  Vagina without discharge, scant dark red blood in vault.  Cervix normal appearing, no lesions.  Bimanual exam not performed.     Pathology:  A. ENDOMETRIAL CURETTINGS:  - ENDOMETRIUM WITH CHRONIC ENDOMETRITIS, DECIDUALIZED STROMA CONSISTENT  WITH PROGESTIN EFFECT, AND AREAS OF DEGENERATIVE CHANGE AND BREAKDOWN.  - ACUTELY INFLAMED SQUAMOUS EPITHELIUM.  - NEGATIVE FOR ATYPIA / EIN AND MALIGNANCY.   Assessment:    Postoperative course complicated by post-operative pain.  Endometritis  AUB Plan:   1. Continue use of Tylenol, can utilize NSAIDs, prescribed Ibuprofen. Can discontinue T#3 and given short supply of Ultram for more severe pain. Also adivsed to resume the Megace for a total of 3 months.  Can decrease to once daily dosing as patient  is having issues with compliance with BID dosing. Also encouraged to complete antibiotic dosing (doxycyline) for endometritis noted on pathology.  2. Wound care discussed. 3. Operative findings again reviewed. Pathology report discussed. 4. Activity restrictions:  pelvic rest x 1 week. 5. Anticipated return to work: now. 6. Pap smear performed today as patient is overdue or screening.   7. Follow up: 3  months  for follow up bleeding.     , MD [facility]

## 2020-11-15 NOTE — Progress Notes (Signed)
Pt present for post op. Pt stated that she was in a lot of pain and the Tylenol 3 is not helping with the pain. Pt stated that her pain level 8.

## 2020-11-15 NOTE — Addendum Note (Signed)
Addended by: Silvano Bilis on: 11/15/2020 03:17 PM   Modules accepted: Orders

## 2020-11-16 NOTE — Telephone Encounter (Signed)
Please advise. Thanks Vendela Troung 

## 2020-11-21 LAB — CYTOLOGY - PAP: Diagnosis: NEGATIVE

## 2021-01-04 ENCOUNTER — Other Ambulatory Visit: Payer: Self-pay

## 2021-01-04 ENCOUNTER — Ambulatory Visit (INDEPENDENT_AMBULATORY_CARE_PROVIDER_SITE_OTHER): Payer: Managed Care, Other (non HMO) | Admitting: Obstetrics and Gynecology

## 2021-01-04 ENCOUNTER — Encounter: Payer: Self-pay | Admitting: Obstetrics and Gynecology

## 2021-01-04 VITALS — BP 121/82 | HR 99 | Resp 16 | Ht 63.0 in | Wt 325.8 lb

## 2021-01-04 DIAGNOSIS — E7800 Pure hypercholesterolemia, unspecified: Secondary | ICD-10-CM

## 2021-01-04 DIAGNOSIS — Z01419 Encounter for gynecological examination (general) (routine) without abnormal findings: Secondary | ICD-10-CM | POA: Diagnosis not present

## 2021-01-04 DIAGNOSIS — E1169 Type 2 diabetes mellitus with other specified complication: Secondary | ICD-10-CM

## 2021-01-04 DIAGNOSIS — E282 Polycystic ovarian syndrome: Secondary | ICD-10-CM | POA: Diagnosis not present

## 2021-01-04 DIAGNOSIS — Z8639 Personal history of other endocrine, nutritional and metabolic disease: Secondary | ICD-10-CM

## 2021-01-04 DIAGNOSIS — Z8659 Personal history of other mental and behavioral disorders: Secondary | ICD-10-CM

## 2021-01-04 DIAGNOSIS — F419 Anxiety disorder, unspecified: Secondary | ICD-10-CM

## 2021-01-04 MED ORDER — BUPROPION HCL ER (XL) 150 MG PO TB24: 150.0000 mg | ORAL_TABLET | Freq: Every day | ORAL | 1 refills | Status: DC

## 2021-01-04 NOTE — Patient Instructions (Addendum)
WEIGHT LOSS MEDICATION OPTIONS  Phentermine-Topiramate extended release (Qsymia) is the most effective weight loss drug available to date. It combines an adrenergic agonist with a neurostabilizer. Adults with migraines and obesity are good candidates for this weight loss medication. This medication is a tablet, and the dose gets titrated up every few weeks.  Side effects include: abnormal sensations, dizziness, taste alterations, insomnia, constipation, and dry mouth. If more than 5% weight loss is not achieved after 12 weeks of the maximum dose, the weight loss pill should be gradually discontinued.  You previously tried Phentermine at a different dose, this one combines Phentermine with another medication to allow for longer use with slower but more manageable weight loss. You can be on this medication for > 1 year or longer.    Bupropion/Naltrexone (Contrave) combines a dopamine/norepinephrine reuptake inhibitor and an opioid receptor antagonist. This medication is a tablet, and the dose gets titrated up every few weeks. It controls cravings and addicted behaviors related to food. Side effects include: constipation, headaches, insomnia, and dry mouth.  You can be on this medication for > 1 year or longer.    Wegovy and Ozempic are newer medication, once-weekly injectable prescriptions. Works well for patient with weight-associated comorbidities such as diabetes or high cholesterol. Each contains semaglutide (GLP-1). Common side effects may include: nausea, diarrhea, vomiting, constipation, stomach (abdomen) pain, headache, tiredness (fatigue), upset stomach, dizziness, feeling bloated, belching, gas, stomach flu, and heartburn.  There is a high demand for this particular medication so may be a slight delay in receiving it. You can be on this medication for > 1 year or longer.   Saxenda is an injection 3 mg is an injectable prescription medicine used once daily. Works well for patient with  weight-associated comorbidities such as diabetes or high cholesterol. Saxenda works like GLP-1 by regulating your appetite, which can lead to eating fewer calories and losing weight. The most common side effects of Saxenda in adults include nausea, diarrhea, constipation, vomiting, injection site reaction, low blood sugar (hypoglycemia), headache, tiredness (fatigue), dizziness, stomach pain, and change in enzyme (lipase) levels in your blood.   

## 2021-01-04 NOTE — Progress Notes (Signed)
GYNECOLOGY ANNUAL PHYSICAL EXAM PROGRESS NOTE  Subjective:   Katherine Huynh is a 25 y.o.Katherine Huynh [id number] female who presents for an annual exam. The patient has no major complaints today. The patient is sexually active. The patient wears seatbelts: yes. The patient participates in regular exercise: yes. There is no domestic violence in her life.   She is also 2 months s/p D&C for persistent abnormal uterine bleeding, failed several medical management options.  Notes that she has only had one cycle since her procedure, occurred last month, was moderately heavy flow.  Has only had an episode of spotting with cramping since that time. Has not taken the Megace since her procedure.   Katherine BongoJalisa also reports frustration surrounding her desires to conceive. Notes that she feels that it will "take forever" to happen naturally.   Gynecologic History  Patient's last menstrual period was 12/07/2020 Period Pattern: (!) Irregular History of STI's: Denies Last Pap: 11/15/2020 Results were: normal.  Denies h/o abnormal pap smears. Contraception: none.  Patient desires to conceive.       Upstream - 01/04/21 1411       Pregnancy Intention Screening   Does the patient want to become pregnant in the next year? Yes    Does the patient's partner want to become pregnant in the next year? Yes    Would the patient like to discuss contraceptive options today? No      Contraception Wrap Up   Current Method Pregnant/Seeking Pregnancy    End Method Pregnant/Seeking Pregnancy    Contraception Counseling Provided No            The pregnancy intention screening data noted above was reviewed. Potential methods of contraception were discussed. The patient elected to proceed with Pregnant/Seeking Pregnancy.  OB History  Gravida Para Term Preterm AB Living  2 0 0 0 2 0  SAB IAB Ectopic Multiple Live Births  2 0 0 0 0    # Outcome Date GA Lbr Len/2nd Weight Sex Delivery Anes PTL Lv  2 SAB           1 SAB              Past Medical History:  Diagnosis Date   Anemia    Anxiety    Bilateral swelling of feet    Constipation    Depression    Fam hx-ischem heart disease 09/15/2015   Heart murmur    Infertility, female    Morbid obesity (HCC) 02/17/2016   Multiple food allergies    Other fatigue    Polycystic disease, ovaries    Prediabetes    Shortness of breath on exertion    Vitamin D deficiency     Past Surgical History:  Procedure Laterality Date   DG HYSTEROGRAM (HSD)  01/13/2020       HYSTEROSCOPY WITH D & C N/A 11/07/2020   Procedure: DILATATION AND CURETTAGE /HYSTEROSCOPY;  Surgeon: Hildred Laserherry, Cailah Reach, MD;  Location: [facility] OR[facility];  Service: Gynecology;  Laterality: N/A;   NO PAST SURGERIES      Family History  Problem Relation Age of Onset   Obesity Mother    Hypertension Father    Hyperlipidemia Father    Sleep apnea Father    Obesity Father    Cancer Maternal Grandmother        breast   Fibroids Maternal Grandmother    Heart disease Maternal Grandfather    Heart disease Paternal Grandfather    Alcohol abuse Sister    ADD /  ADHD Sister     Social History   Socioeconomic History   Marital status: Significant Other    Spouse name: [name]    Number of children: 0   Years of education: Not on file   Highest education level: High school graduate  Occupational History   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  Tobacco Use   Smoking status: Former    Types: Cigarettes    Quit date: 09/28/2016    Years since quitting: 4.2   Smokeless tobacco: Never  Vaping Use   Vaping Use: Some days   Substances: Nicotine, Flavoring  Substance and Sexual Activity   Alcohol use: Not Currently    Alcohol/week: 6.0 standard drinks    Types: 3 Glasses of wine, 2 Cans of beer, 1 Shots of liquor per week    Comment: occas   Drug use: Not Currently    Types: Marijuana    Comment: last used about a month ago   Sexual activity: Not Currently    Partners: Male    Birth control/protection: Pill  Other  Topics Concern   Not on file  Social History Narrative   Lives with [place], they live together, works full time.    Social Determinants of Health   Financial Resource Strain: Not on file  Food Insecurity: Not on file  Transportation Needs: Not on file  Physical Activity: Not on file  Stress: Not on file  Social Connections: Not on file  Intimate Partner Violence: Not on file    Current Outpatient Medications on File Prior to Visit  Medication Sig Dispense Refill   doxycycline (VIBRAMYCIN) 100 MG capsule Take 1 capsule (100 mg total) by mouth 2 (two) times daily. (Patient not taking: Reported on 01/04/2021) 14 capsule 0   ibuprofen (ADVIL) 800 MG tablet Take 1 tablet (800 mg total) by mouth every 8 (eight) hours as needed for mild pain or moderate pain. (Patient not taking: Reported on 01/04/2021) 30 tablet 0   megestrol (MEGACE) 40 MG tablet Take 1 tablet (40 mg total) by mouth 2 (two) times daily. Can increase to two tablets twice a day in the event of heavy bleeding (Patient not taking: Reported on 01/04/2021) 60 tablet 5   naproxen (NAPROSYN) 500 MG tablet Take 1 tablet (500 mg total) by mouth 2 (two) times daily with a meal. (Patient not taking: Reported on 01/04/2021) 20 tablet 0   traMADol (ULTRAM) 50 MG tablet Take 1 tablet (50 mg total) by mouth every 6 (six) hours as needed for severe pain or moderate pain (May take up to 2 tablets every 6 hrs for severe pain). (Patient not taking: Reported on 01/04/2021) 20 tablet 0   No current facility-administered medications on file prior to visit.    Allergies  Allergen Reactions   Cinnamon Itching   Erythromycin Rash   Sulfa Antibiotics Rash     Review of Systems Constitutional: negative for chills, fatigue, fevers and sweats Eyes: negative for irritation, redness and visual disturbance Ears, nose, mouth, throat, and face: negative for hearing loss, nasal congestion, snoring and tinnitus Respiratory: negative for asthma, cough,  sputum Cardiovascular: negative for chest pain, dyspnea, exertional chest pressure/discomfort, irregular heart beat, palpitations and syncope Gastrointestinal: negative for abdominal pain, change in bowel habits, nausea and vomiting Genitourinary: positive for abnormal menstrual periods.  Negative for genital lesions, sexual problems and vaginal discharge, dysuria and urinary incontinence Integument/breast: negative for breast lump, breast tenderness and nipple discharge Hematologic/lymphatic: negative for bleeding and easy bruising Musculoskeletal:negative for  back pain and muscle weakness Neurological: negative for dizziness, headaches, vertigo and weakness Endocrine: negative for diabetic symptoms including polydipsia, polyuria and skin dryness Allergic/Immunologic: negative for hay fever and urticaria Psychological: positive for - anxiety, irritability, mood swings, and sleep disturbances negative for - hallucinations or suicidal ideation    Objective:  Blood pressure 121/82, pulse 99, resp. rate 16, height 5\' 3"  (1.6 m), weight (!) 325 lb 12.8 oz (147.8 kg).  Body mass index is 57.71 kg/m.   General Appearance:    Alert, cooperative, no distress, appears stated age, morbid obesity  Head:    Normocephalic, without obvious abnormality, atraumatic  Eyes:    PERRL, conjunctiva/corneas clear, EOM's intact, both eyes  Ears:    Normal external ear canals, both ears  Nose:   Nares normal, septum midline, mucosa normal, no drainage or sinus tenderness  Throat:   Lips, mucosa, and tongue normal; teeth and gums normal  Neck:   Supple, symmetrical, trachea midline, no adenopathy; thyroid: no enlargement/tenderness/nodules; no carotid bruit or JVD  Back:     Symmetric, no curvature, ROM normal, no CVA tenderness  Lungs:     Clear to auscultation bilaterally, respirations unlabored  Chest Wall:    No tenderness or deformity   Heart:    Regular rate and rhythm, S1 and S2 normal, no murmur, rub or  gallop  Breast Exam:    No tenderness, masses, or nipple abnormality  Abdomen:     Soft, non-tender, bowel sounds active all four quadrants, no masses, no organomegaly.    Genitalia:   Exam deferred. Patient just had pap smear performed last month at post-op visit.   Rectal:    Deferred  Extremities:   Extremities normal, atraumatic, no cyanosis or edema  Pulses:   2+ and symmetric all extremities  Skin:   Skin color, texture, turgor normal, no rashes or lesions  Lymph nodes:   Cervical, supraclavicular, and axillary nodes normal  Neurologic:   CNII-XII intact, normal strength, sensation and reflexes throughout   .  GAD 7 : Generalized Anxiety Score 01/04/2021 09/20/2020 12/30/2019 02/11/2019  Nervous, Anxious, on Edge 3 3 2 3   Control/stop worrying 3 3 3 3   Worry too much - different things 3 3 3 3   Trouble relaxing 3 3 2 3   Restless 2 1 0 3  Easily annoyed or irritable 3 3 3 3   Afraid - awful might happen 3 3 3 3   Total GAD 7 Score 20 19 16 21   Anxiety Difficulty Extremely difficult Extremely difficult Extremely difficult Extremely difficult    Depression screen [facility] 2/9 01/04/2021 09/20/2020 06/23/2020 12/30/2019 10/30/2019  Decreased Interest 3 3 3 2 1   Down, Depressed, Hopeless 3 3 3 3 2   PHQ - 2 Score 6 6 6 5 3   Altered sleeping 3 3 2 3 2   Tired, decreased energy 3 3 3 3 1   Change in appetite 3 3 1 3 1   Feeling bad or failure about yourself  3 3 1 3 3   Trouble concentrating 3 3 1 2 1   Moving slowly or fidgety/restless 0 0 1 0 0  Suicidal thoughts 1 2 0 0 1  PHQ-9 Score 22 23 15 19 12   Difficult doing work/chores Extremely dIfficult Extremely dIfficult Somewhat difficult Extremely dIfficult Somewhat difficult  Some recent data might be hidden    Labs:  Lab Results  Component Value Date   WBC 8.9 11/01/2020   HGB 11.7 (L) 11/01/2020   HCT 35.1 (L) 11/01/2020  MCV 84.0 11/01/2020   PLT 329 11/01/2020    Lab Results  Component Value Date   CREATININE 0.76 11/01/2020    BUN 11 11/01/2020   NA 136 11/01/2020   K 3.7 11/01/2020   CL 111 11/01/2020   CO2 20 (L) 11/01/2020    Lab Results  Component Value Date   ALT 27 11/01/2020   AST 21 11/01/2020   ALKPHOS 90 11/01/2020   BILITOT 0.6 11/01/2020    Lab Results  Component Value Date   TSH 2.970 06/23/2020     Assessment:   1. Encounter for well woman exam with routine gynecological exam   2. History of depression   3. Anxiety   4. PCO (polycystic ovaries)   5. Type 2 diabetes mellitus with other specified complication, without long-term current use of insulin (HCC)   6. Pure hypercholesterolemia   7. History of vitamin D deficiency      Plan:  - Blood tests: Hemoglobin A1c, Lipid panel, Vitamin D, and PCOS profile. - Breast self exam technique reviewed and patient encouraged to perform self-exam monthly. - Contraception: none.  Desires to conceive. Continue to reiterate healthy lifestyle modifications can help to improve chances.  - Discussed healthy lifestyle modifications.  Patient attempted on weight loss management with medication Phentermine in the past with limited success.  Does not desire to consider bariatric surgery at this time. Discussed other weight loss medication options, given handout.  If desired, can prescribe.  - Pap smear  Up to date . - COVID vaccination status: Declined - History of anxiety and depression, patient notes that she is ready to resume therapy but no longer has a Veterinary surgeon.  Will place new referral. Also discussed option of resuming medications.  Patient ok to initiate but would like to try something different. Has utilized Zoloft, Prozac, Lexapro, and Buspar in the past. Will try Wellbutrin. May be able to benefit from side effect of weight loss as well.  - Discussed concerns for conception, patient states she knows it is likely because she does not ovulate.  We have attempted a short course of ovulation medication in the past, however as I informed her before,  the best way to naturally restore ovulation is through healthy lifestyle and weight loss. Offered referral to [facility] as well to discuss her options, however patient declines, notes she desires to conceive naturally.  - Will f/u on DM and Cholesterol levels. Patient currently does not have a PCP.  - Follow up in 1 year for annual exam.  Follow up in 1 month for mood check.    Hildred Laser, MD [facility]

## 2021-01-06 LAB — SPECIMEN STATUS REPORT

## 2021-01-06 LAB — VITAMIN D 25 HYDROXY (VIT D DEFICIENCY, FRACTURES): Vit D, 25-Hydroxy: 13.9 ng/mL — ABNORMAL LOW (ref 30.0–100.0)

## 2021-01-09 MED ORDER — CONTRAVE 8-90 MG PO TB12: ORAL_TABLET | ORAL | 6 refills | Status: DC

## 2021-01-11 ENCOUNTER — Other Ambulatory Visit: Payer: Self-pay

## 2021-01-11 NOTE — Progress Notes (Signed)
error 

## 2021-01-18 ENCOUNTER — Other Ambulatory Visit: Payer: Self-pay

## 2021-01-18 LAB — HEMOGLOBIN A1C
Est. average glucose Bld gHb Est-mCnc: 114 mg/dL
Hgb A1c MFr Bld: 5.6 % (ref 4.8–5.6)

## 2021-01-18 LAB — LIPID PANEL
Chol/HDL Ratio: 4.2 ratio (ref 0.0–4.4)
Cholesterol, Total: 163 mg/dL (ref 100–199)
HDL: 39 mg/dL — ABNORMAL LOW (ref >39)
LDL Chol Calc (NIH): 105 mg/dL — ABNORMAL HIGH (ref 0–99)
Triglycerides: 103 mg/dL (ref 0–149)
VLDL Cholesterol Cal: 19 mg/dL (ref 5–40)

## 2021-01-18 LAB — PCOS DIAGNOSTIC PROFILE
17-Alpha-Hydroxyprogesterone: <10 ng/dL
ANTI-MULLERIAN HORMONE (AMH): 1.95 ng/mL
DHEA-Sulfate, LCMS: 160 ug/dL
Estradiol, Serum, MS: 21 pg/mL
Follicle Stimulating Hormone: 2.8 m[IU]/mL
Free Testosterone, Serum: 1.9 pg/mL
Luteinizing Hormone (LH) ECL: 1.2 m[IU]/mL
Prolactin: 9.12 ng/mL
Sex Hormone Binding Globulin: 23.3 nmol/L — ABNORMAL LOW
TSH: 1.5 uU/mL
Testosterone, Serum (Total): 17 ng/dL
Testosterone-% Free: 1.1 %

## 2021-01-18 MED ORDER — VITAMIN D (ERGOCALCIFEROL) 1.25 MG (50000 UNIT) PO CAPS: 50000.0000 [IU] | ORAL_CAPSULE | ORAL | 0 refills | Status: DC

## 2021-01-23 ENCOUNTER — Other Ambulatory Visit: Payer: Self-pay | Admitting: Obstetrics and Gynecology

## 2021-01-23 MED ORDER — CONTRAVE 8-90 MG PO TB12: ORAL_TABLET | ORAL | 6 refills | Status: DC

## 2021-02-01 ENCOUNTER — Other Ambulatory Visit: Payer: Self-pay

## 2021-02-01 ENCOUNTER — Telehealth: Payer: Managed Care, Other (non HMO) | Admitting: Obstetrics and Gynecology

## 2021-02-06 ENCOUNTER — Telehealth: Payer: Self-pay | Admitting: Obstetrics and Gynecology

## 2021-02-06 NOTE — Telephone Encounter (Signed)
Spoke to pharmacist at [facility] pharmacy she stated that something was going on with the patient's insurance and that there were information missing and that they had attempted to contact pt several times with no answer. Pharmacist stated that she was going to contact the pt as soon as she got off the phone with me. Gave pt's name, DOB and phone number to pharmacist.

## 2021-02-06 NOTE — Telephone Encounter (Signed)
Pt called and she stated that she still has not heard from the mail order pharmacy concerning her medication Contrave. Pt stated that she has tried to contact the pharmacy without any results.

## 2021-02-06 NOTE — Telephone Encounter (Signed)
Katherine Huynh called in and states the prescription she was supposed to get Katherine Huynh) was sent to the wrong pharmacy.  It was sent to a pharmacy in [place].  Patient would like this sent to the correct pharmacy and that is [facility] on [address]

## 2021-02-10 NOTE — Telephone Encounter (Signed)
Called pharmacy and asked them to please contact pt again due to they got disconnected last time. Pt is aware via mychart. Please see mychart messages.

## 2021-06-14 ENCOUNTER — Ambulatory Visit (INDEPENDENT_AMBULATORY_CARE_PROVIDER_SITE_OTHER): Payer: Managed Care, Other (non HMO) | Admitting: Obstetrics and Gynecology

## 2021-06-14 ENCOUNTER — Other Ambulatory Visit: Payer: Self-pay

## 2021-06-14 ENCOUNTER — Encounter: Payer: Self-pay | Admitting: Obstetrics and Gynecology

## 2021-06-14 VITALS — BP 126/86 | HR 78 | Ht 63.0 in | Wt 328.0 lb

## 2021-06-14 DIAGNOSIS — N939 Abnormal uterine and vaginal bleeding, unspecified: Secondary | ICD-10-CM | POA: Diagnosis not present

## 2021-06-14 DIAGNOSIS — Z87898 Personal history of other specified conditions: Secondary | ICD-10-CM

## 2021-06-14 DIAGNOSIS — N926 Irregular menstruation, unspecified: Secondary | ICD-10-CM

## 2021-06-14 DIAGNOSIS — E282 Polycystic ovarian syndrome: Secondary | ICD-10-CM | POA: Diagnosis not present

## 2021-06-14 MED ORDER — OZEMPIC (1 MG/DOSE) 4 MG/3ML ~~LOC~~ SOPN: 1.0000 mg | PEN_INJECTOR | SUBCUTANEOUS | 6 refills | Status: DC

## 2021-06-14 MED ORDER — MEDROXYPROGESTERONE ACETATE 10 MG PO TABS: 10.0000 mg | ORAL_TABLET | Freq: Every day | ORAL | 2 refills | Status: DC

## 2021-06-14 MED ORDER — OZEMPIC (0.25 OR 0.5 MG/DOSE) 2 MG/1.5ML ~~LOC~~ SOPN: 0.5000 mg | PEN_INJECTOR | SUBCUTANEOUS | 0 refills | Status: DC

## 2021-06-14 NOTE — Progress Notes (Signed)
° ° °  GYNECOLOGY PROGRESS NOTE  Subjective:    Patient ID: Katherine Huynh, female    DOB: 06-30-1995, 26 y.o.   MRN: [id number]  HPI  Patient is a 26 y.o. [id number] female who presents for complaints of vaginal spotting x 6 weeks. Has a h/o PCOS and irregular heavy menses.  Notes last cycle was in October. Denies vaginal discharge. Occasionally takes her Megace when cycles are heavy. Of note, patient had a D&C for her bleeding in June 2022.   Additionally, discussed patient's weight loss progress. Notes that she was never able to start the Contrave as her pharmacy gave her the run around with her prescription.   The following portions of the patient's history were reviewed and updated as appropriate: allergies, current medications, past family history, past medical history, past social history, past surgical history, and problem list.  Review of Systems Pertinent items noted in HPI and remainder of comprehensive ROS otherwise negative.   Objective:   Blood pressure 126/86, pulse 78, height 5\' 3"  (1.6 m), weight (!) 328 lb (148.8 kg), last menstrual period 04/07/2021, SpO2 98 %.  Body mass index is 58.1 kg/m. General appearance: alert and no distress Abdomen: soft, non-tender; bowel sounds normal; no masses,  no organomegaly Pelvic: external genitalia normal, rectovaginal septum normal.  Vagina with scant thin red-Luthi discharge.  Cervix difficult to visualize due to body habitus.   Uterus unable to palpate but does not feel enlarged.  Extremities: extremities normal, atraumatic, no cyanosis or edema Neurologic: Grossly normal   Assessment:     1. Vaginal spotting   2. Irregular menses   3. Polycystic disease, ovaries   4. Morbid obesity (HCC)   5. History of prediabetes     Plan:   1. Vaginal spotting - Likely secondary to h/o irregular menses and PCOS. Will order Provera challenge as patient has responded in the past, since last cycle was in October.   2. Irregular menses  - See  above  3. Polycystic disease, ovaries - H/o irregular cycles, also at risk for metabolic syndrome.   4. Morbid obesity (HCC) - Discussed weight loss options again, can consider Semaglutide in light of previous history.  Patient prefers injections vs tablets for to aid in weight loss. Will prescribe Ozempic.   5. History of prediabetes - Prescribed Ozempic for management of prediabetes and weight loss.    November, MD [facility]

## 2021-06-25 ENCOUNTER — Encounter: Payer: Self-pay | Admitting: Obstetrics and Gynecology

## 2021-06-26 ENCOUNTER — Other Ambulatory Visit: Payer: Self-pay

## 2021-06-26 MED ORDER — OZEMPIC (1 MG/DOSE) 4 MG/3ML ~~LOC~~ SOPN: 1.0000 mg | PEN_INJECTOR | SUBCUTANEOUS | 6 refills | Status: DC

## 2021-08-01 ENCOUNTER — Encounter: Payer: Self-pay | Admitting: Obstetrics and Gynecology

## 2021-08-16 ENCOUNTER — Encounter: Payer: Self-pay | Admitting: Obstetrics and Gynecology

## 2021-08-17 ENCOUNTER — Encounter: Payer: Self-pay | Admitting: Obstetrics and Gynecology

## 2021-08-18 IMAGING — CT CT ABD-PELV W/O CM
2 of 4 series · 15 of 46 positions shown, 17 images · non-contrast
Comparison: None.

CLINICAL DATA: Abdominal pain and vaginal bleeding

EXAM:
CT ABDOMEN AND PELVIS WITHOUT CONTRAST
TECHNIQUE: Multidetector CT imaging of the abdomen and pelvis was performed
following the standard protocol without oral or IV contrast.

[Series 2: routine abd/pel wo · axial · 0.88mm/px · z∈[-347,+83]mm · 12 of 96 slices shown, 14 images]
[im 5/96  soft-tissue]
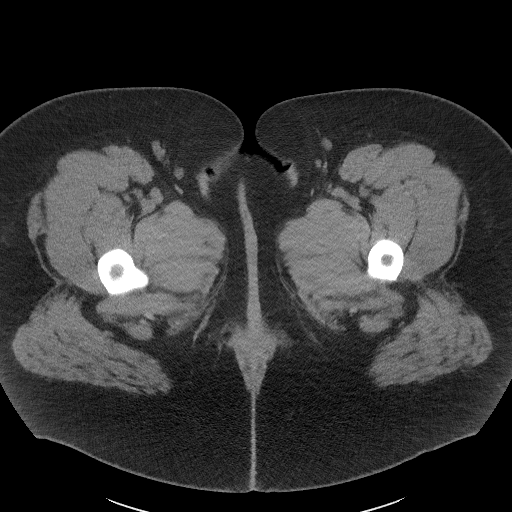
[im 5/96  bone]
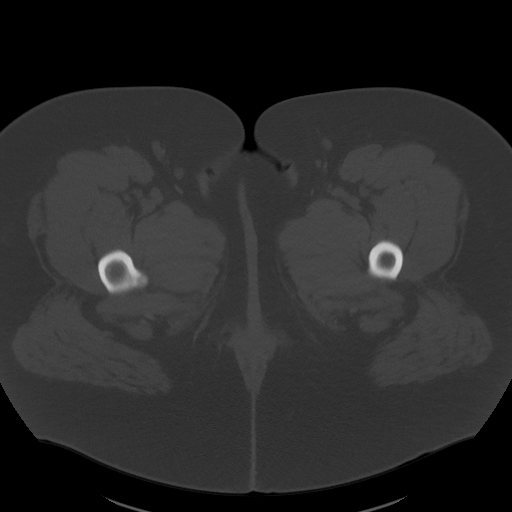
[im 13/96  soft-tissue]
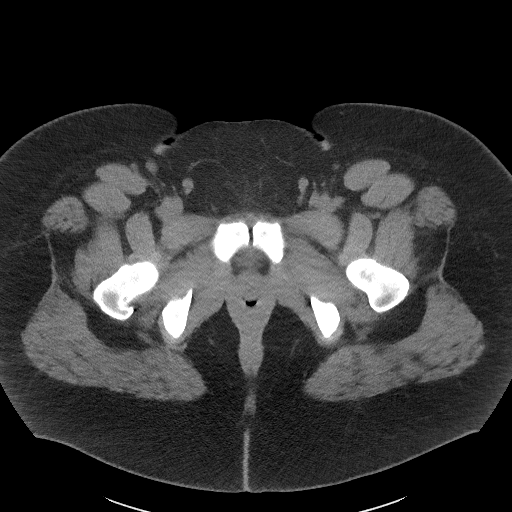
[im 21/96  soft-tissue]
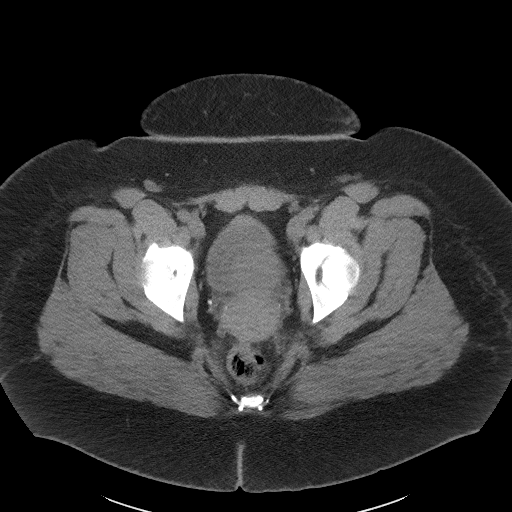
[im 29/96  soft-tissue]
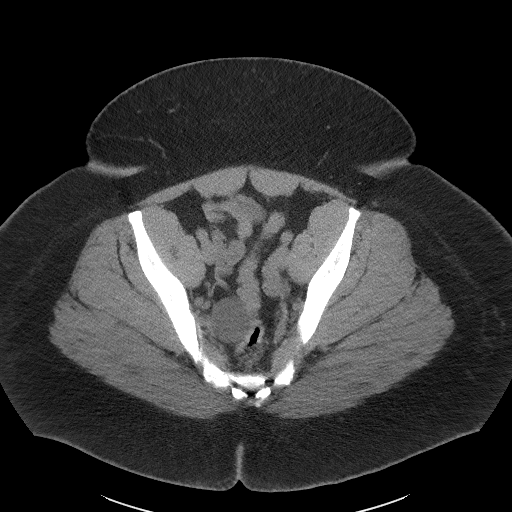
[im 38/96  soft-tissue]
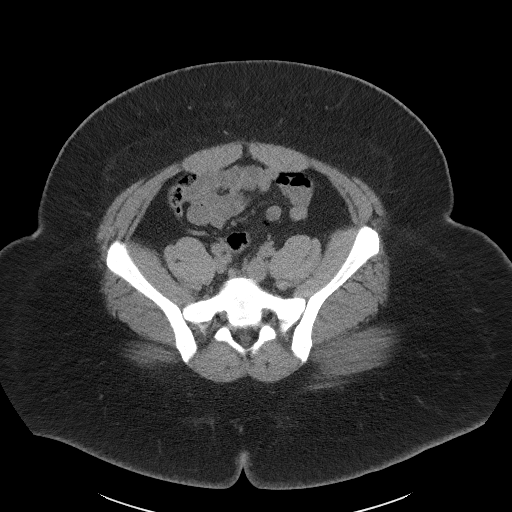
[im 46/96  soft-tissue]
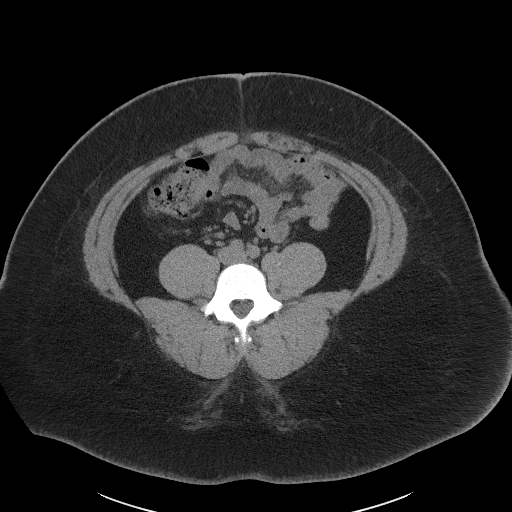
[im 50/96  soft-tissue]
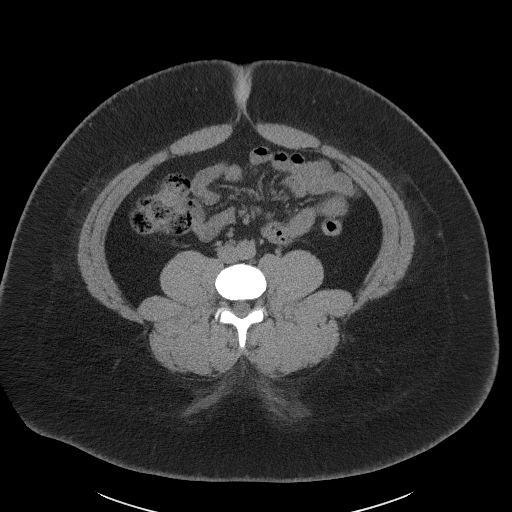
[im 58/96  soft-tissue]
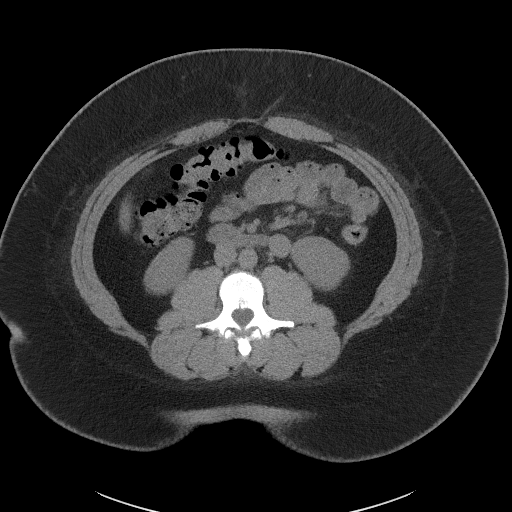
[im 67/96  soft-tissue]
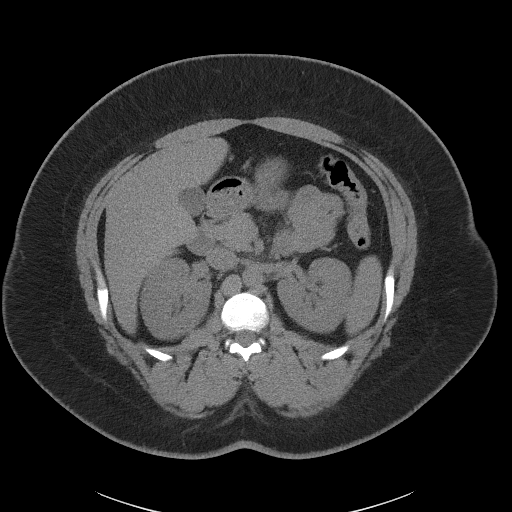
[im 67/96  bone]
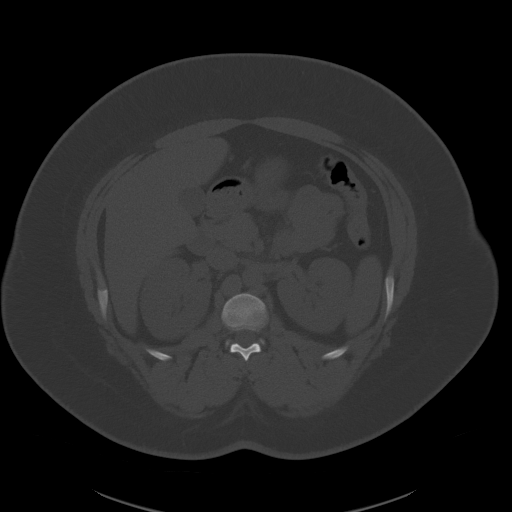
[im 75/96  soft-tissue]
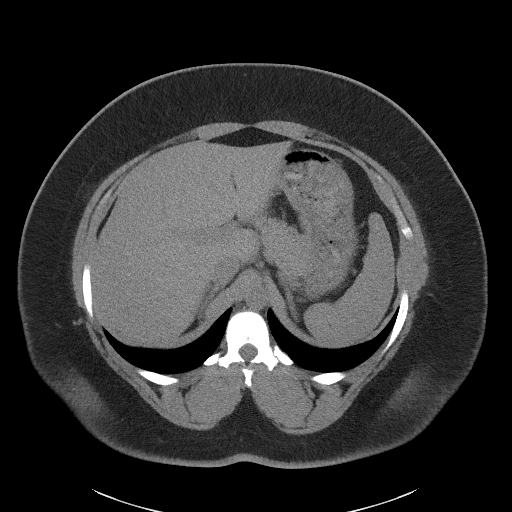
[im 83/96  soft-tissue]
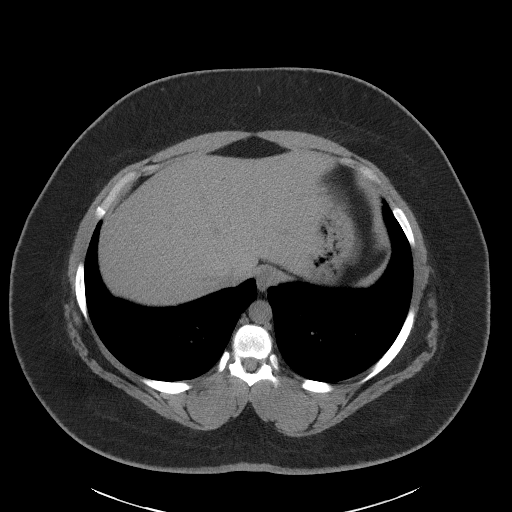
[im 91/96  soft-tissue]
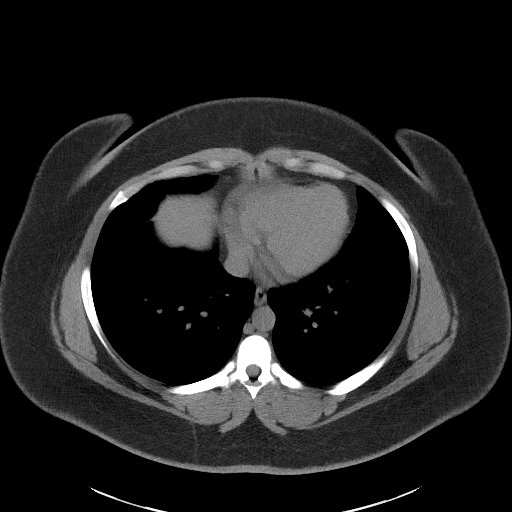

[Series 5: coronal st · coronal · 0.78mm/px · 3 of 101 slices shown]
[im 34/101  soft-tissue]
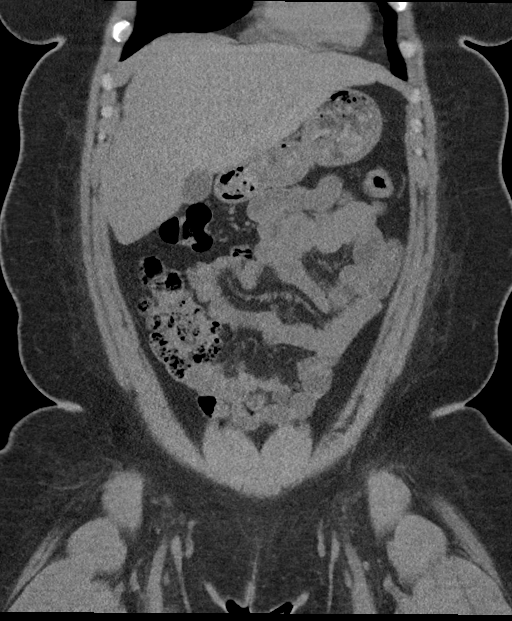
[im 45/101  soft-tissue]
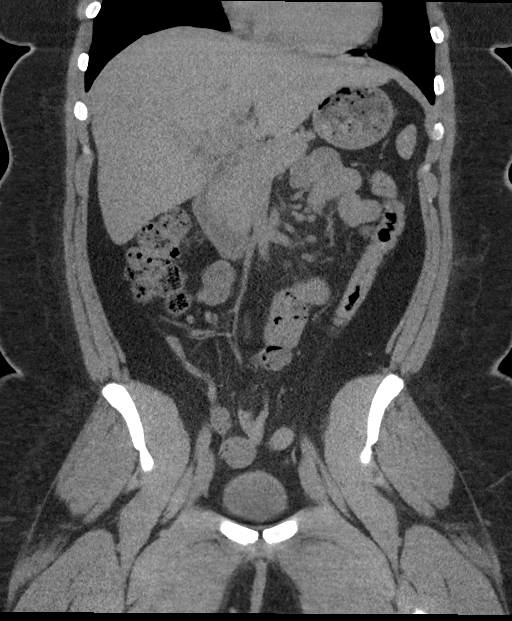
[im 56/101  soft-tissue]
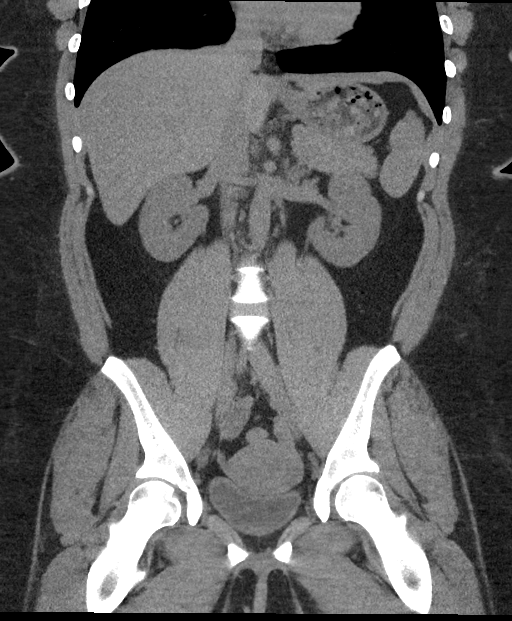

[15 of 46 positions shown; findings below may reference images not displayed]

FINDINGS: Lower chest: Lung bases are clear.

Hepatobiliary: No focal liver lesions are appreciable. Gallbladder
wall is not appreciably thickened. There is no biliary duct
dilatation.

Pancreas: There is no pancreatic mass or inflammatory focus.

Spleen: No splenic lesions are evident.

Adrenals/Urinary Tract: Adrenals bilaterally appear normal. No
evident renal mass or hydronephrosis on either side. There is no
appreciable renal or ureteral calculus on either side. Urinary
bladder is midline with wall thickness within normal limits.

Stomach/Bowel: There is no appreciable bowel wall or mesenteric
thickening. No evident bowel obstruction. Terminal ileum appears
normal. Appendix appears normal. There is no evident free air or
portal venous air.

Vascular/Lymphatic: No abdominal aortic aneurysm. No vascular
lesions evident noncontrast enhanced study. There is a right
inguinal lymph node measuring 1.4 x 1.3 cm. There are several
subcentimeter inguinal lymph nodes as well. No other areas of lymph
node prominence evident.

Reproductive: The uterus is anteverted. There is no intrauterine
mass. There is no endometrial/intrauterine air. Endometrium appears
prominent measuring 19 mm in thickness. There is a right ovarian
cyst measuring 4.0 x 3.5 x 3.8 cm. No other adnexal mass evident.

Other: No evident abscess or ascites in the abdomen or pelvis.

Musculoskeletal: No blastic or lytic bone lesions. No intramuscular
or abdominal wall lesions are evident.
IMPRESSION: 1. Endometrium is rather prominent. Endometrial or myometrial air
evident. No intrauterine mass evident on noncontrast enhanced study.
Prominence of the endometrium may warrant pelvic ultrasound to
further evaluate.

2. Right ovarian cyst measuring 4.0 x 3.5 x 3.8 cm. No follow-up
imaging recommended with respect to this cyst per consensus
guidelines. Note: This recommendation does not apply to premenarchal
patients and to those with increased risk (genetic, family history,
elevated tumor markers or other high-risk factors) of ovarian
cancer. Reference: JACR [DATE]):[id number]

3. No bowel wall thickening or bowel obstruction. No abscess in the
abdomen or pelvis. Appendix appears normal.

4. No evident renal or ureteral calculus. No hydronephrosis. Urinary
bladder wall thickness normal.

5. Prominent right inguinal lymph node of uncertain etiology.
Suspect reactive etiology as most likely. Several subcentimeter
inguinal lymph elsewhere noted.

## 2021-08-18 IMAGING — US US PELVIS COMPLETE WITH TRANSVAGINAL
1 series · 13 of 25 positions shown · non-contrast
Comparison: 05/22/2018

CLINICAL DATA: Pelvic pain post uterine biopsy 3 days ago



[Series 1: us pelvic complete with transvaginal · 13 of 89 slices shown]
[im 1/89]
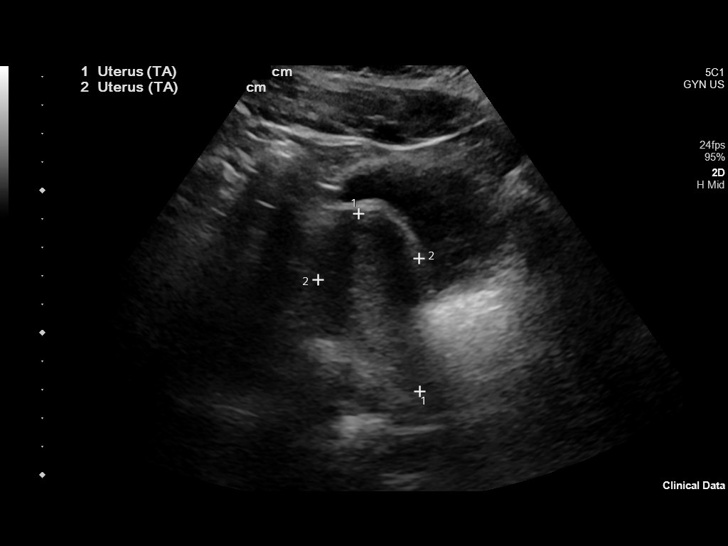
[im 8/89]
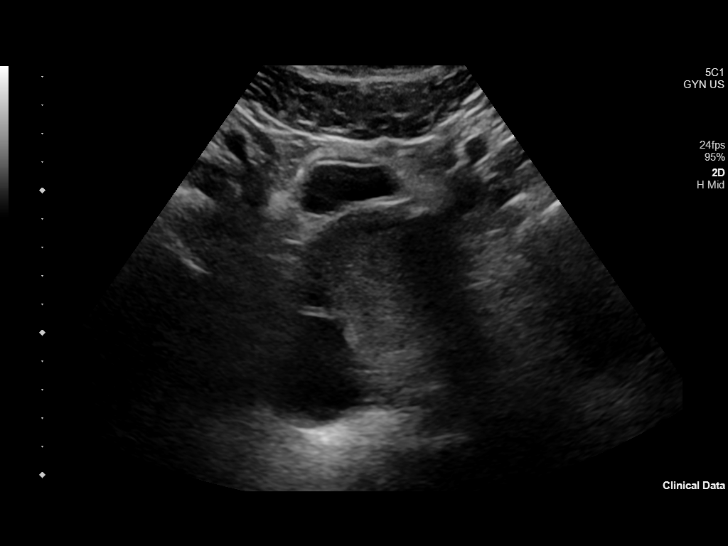
[im 15/89]
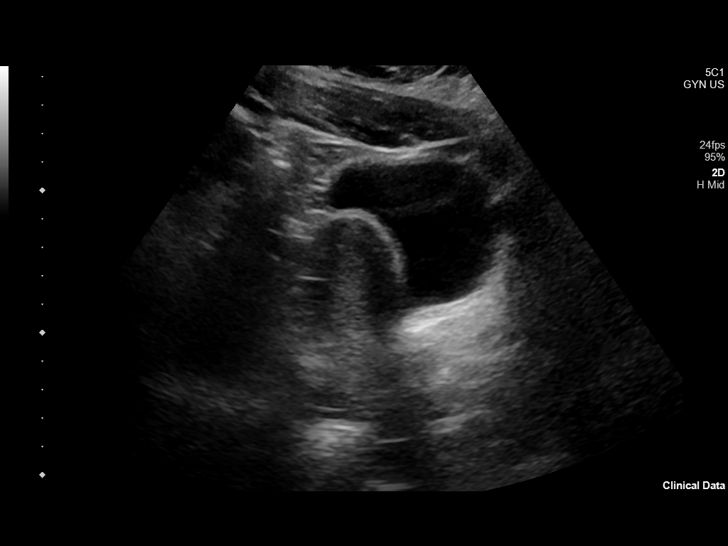
[im 23/89]
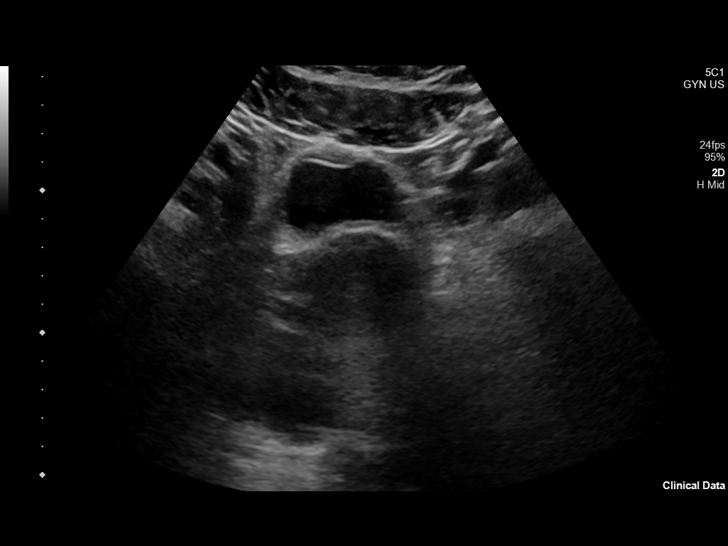
[im 30/89]
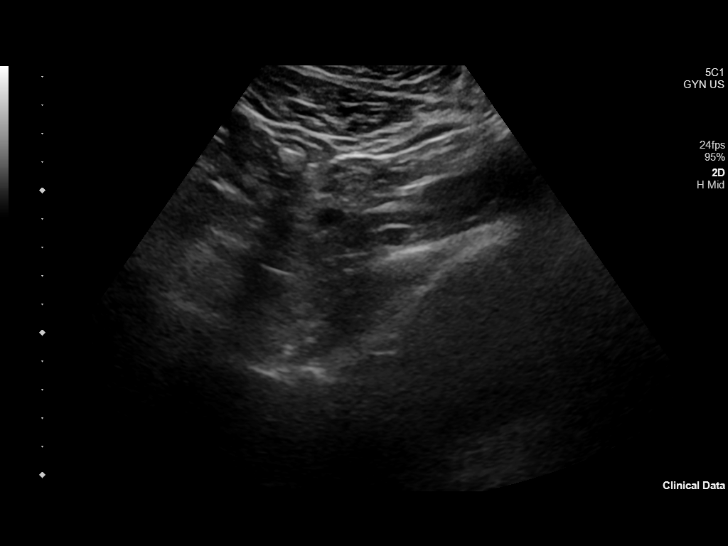
[im 37/89]
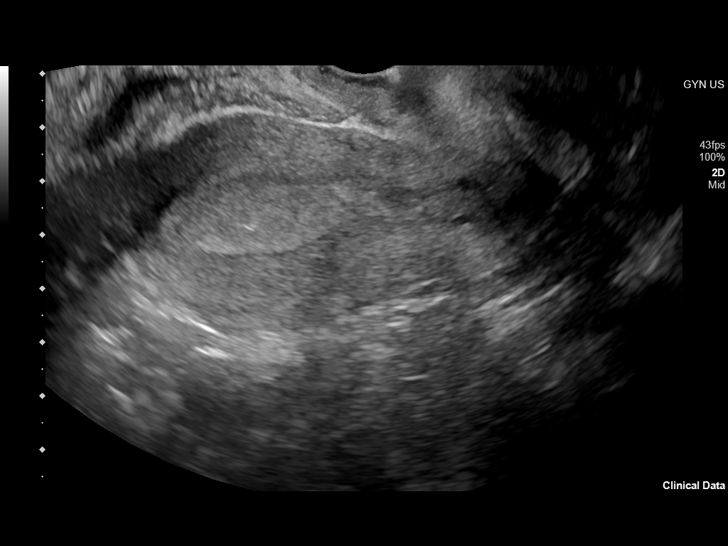
[im 45/89]
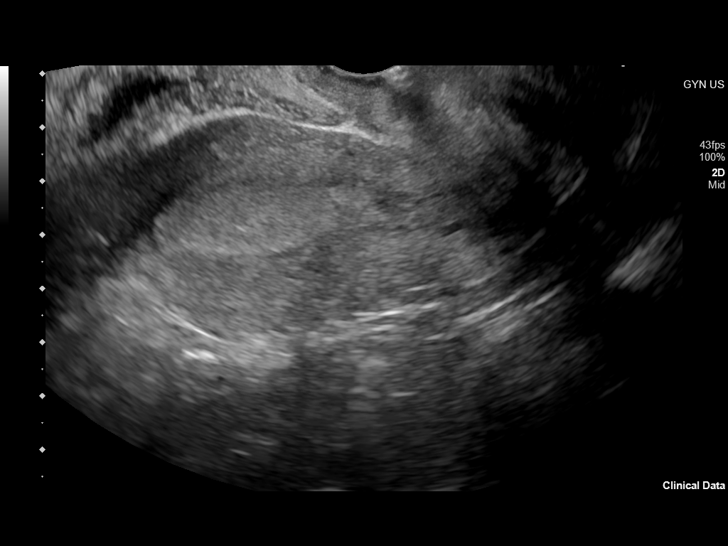
[im 52/89]
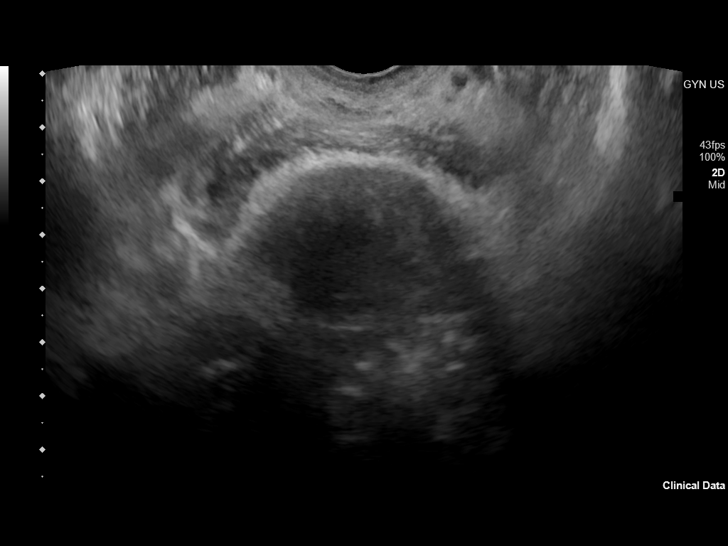
[im 59/89]
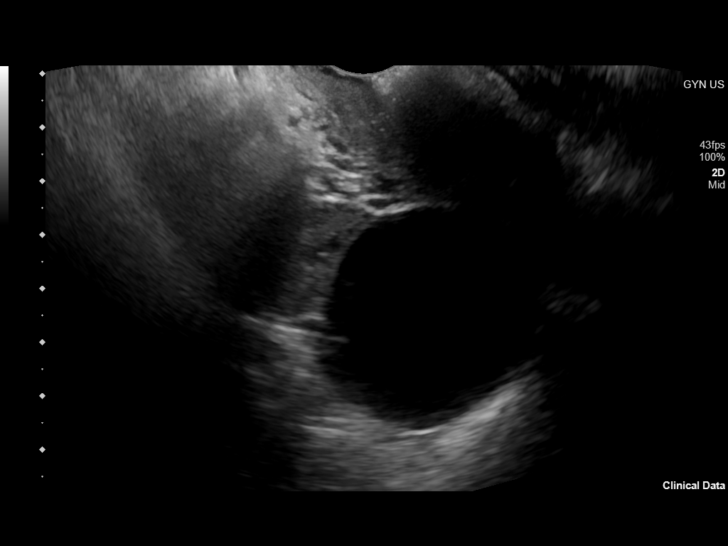
[im 67/89]
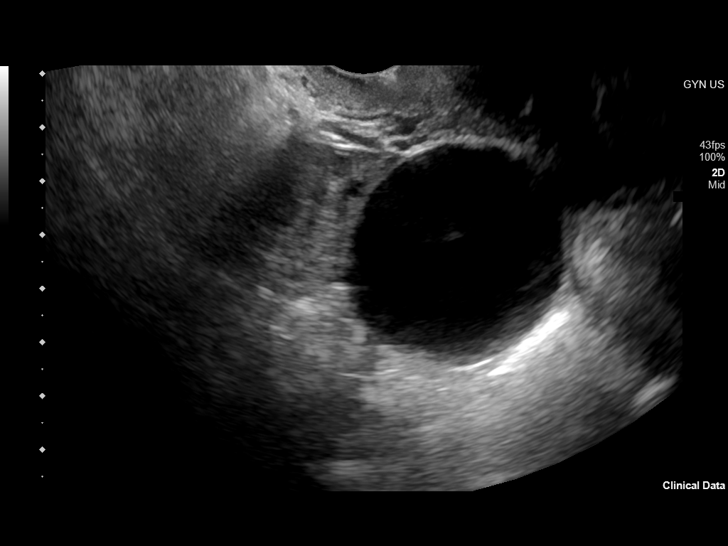
[im 74/89]
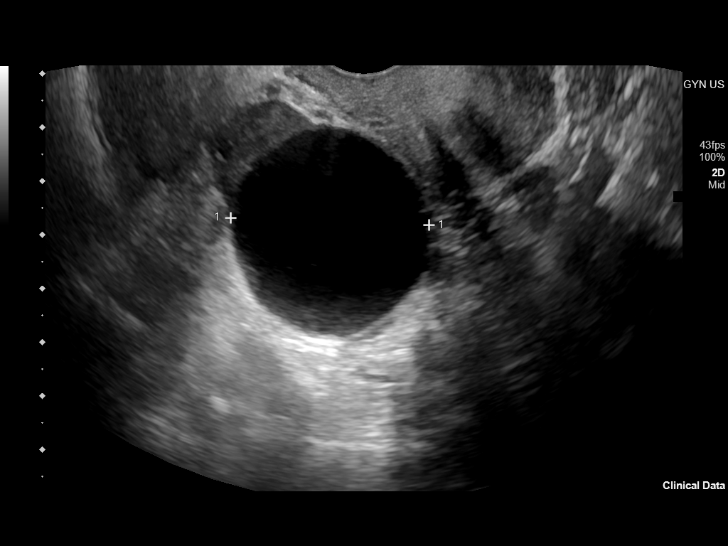
[im 81/89]
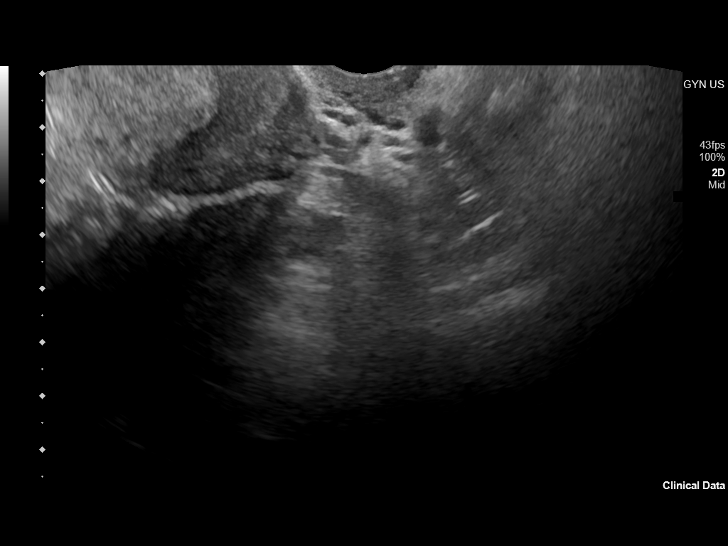
[im 89/89]
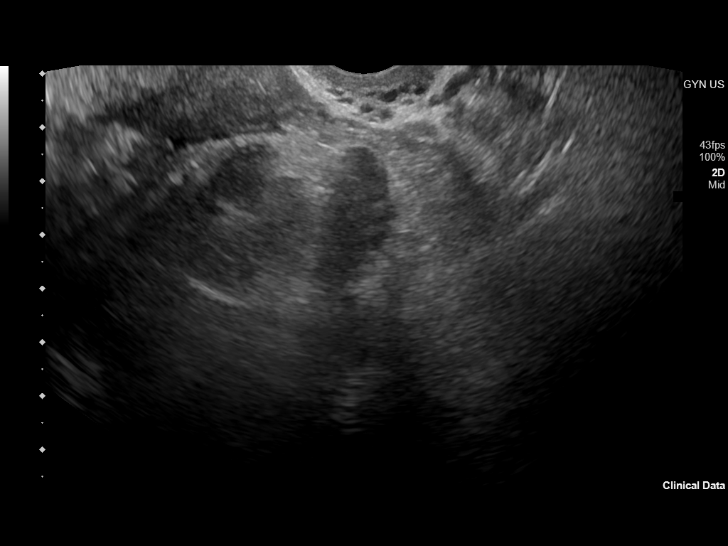

[13 of 25 positions shown; findings below may reference images not displayed]

FINDINGS: Uterus

Measurements: 7.1 x 4.2 x 5.4 cm = volume: 82 mL. No fibroids or
other mass visualized.

Endometrium

Thickness: 15 mm in thickness.  No focal abnormality visualized.

Right ovary

Measurements: 6.8 x 3.0 x 2.3 cm = volume: 26 mL. 4.3 cm simple
appearing cyst adjacent to the right ovary, likely paraovarian cyst.
This measured up 2 2.3 cm previously.

Left ovary

Measurements: 3.7 x 2.0 x 1.7 cm = volume: 6.5 mL. Normal
appearance/no adnexal mass.

Other findings

No abnormal free fluid.
IMPRESSION: Endometrium upper limits normal in thickness at 15 mm. No focal
abnormality.

4.3 cm right paraovarian cyst which appears simple. This has
enlarged since prior study when this measured 2.3 cm.

## 2021-09-14 ENCOUNTER — Encounter: Payer: Managed Care, Other (non HMO) | Admitting: Obstetrics and Gynecology

## 2021-09-21 ENCOUNTER — Ambulatory Visit: Payer: Managed Care, Other (non HMO) | Admitting: Obstetrics and Gynecology

## 2021-09-21 ENCOUNTER — Encounter: Payer: Self-pay | Admitting: Obstetrics and Gynecology

## 2021-09-21 VITALS — BP 98/71 | HR 92 | Resp 16 | Ht 63.0 in | Wt 321.8 lb

## 2021-09-21 DIAGNOSIS — R7303 Prediabetes: Secondary | ICD-10-CM | POA: Diagnosis not present

## 2021-09-21 DIAGNOSIS — Z7689 Persons encountering health services in other specified circumstances: Secondary | ICD-10-CM

## 2021-09-21 DIAGNOSIS — E282 Polycystic ovarian syndrome: Secondary | ICD-10-CM | POA: Diagnosis not present

## 2021-09-21 NOTE — Progress Notes (Signed)
? ? ?  GYNECOLOGY PROGRESS NOTE ? ?Subjective:  ? ? Patient ID: Katherine Huynh, female    DOB: 10/10/1995, 26 y.o.   MRN: [id number] ? ?HPI ? Patient is a 26 y.o. female who presents for 3 month weight management follow up. She has a past history of obesity, diabetes, hypercholesterolemia. She initiated use of Ozempic 3 months ago.  Denies any undesirable side effects and reports compliance with medications. Does note some occasional nausea.  ?  ?Current interventions:  ?1. Diet - Trying calorie restriction, 1800 kcal, misses goal ~ 2 times per week. Eats lots of meat, mostly baked chicken, tries to include a veggie but hard to cut out starchy options.  ?2. Activity - Does weight training and cardio. Just started ~ 3 weeks ago. Going 3 times per week to gym.  ?3. Reports bowel movements are normal.  ? ? ?The following portions of the patient's history were reviewed and updated as appropriate: allergies, current medications, past family history, past medical history, past social history, past surgical history, and problem list. ? ?Review of Systems ?Pertinent items noted in HPI and remainder of comprehensive ROS otherwise negative.  ? ?Objective:  ?  ? ?  09/21/2021  ?  1:46 PM 06/14/2021  ?  2:47 PM 01/04/2021  ?  4:06 PM  ?Vitals with BMI  ?Height 5\' 3"  5\' 3"  5\' 3"   ?Weight 321 lbs 13 oz 328 lbs 325 lbs 13 oz  ?BMI 57.02 58.12 57.73  ?Systolic 98 126 121  ?Diastolic 71 86 82  ?Pulse 92 78 99  ? ? ?General appearance: alert, cooperative, and no distress ?Abdomen: soft, non-tender.  Waist circumference 47 in.  ? ? ?Labs:  ?No new labs ? ?Assessment:  ? ?Weight management ?Obesity, Body mass index is 57 kg/m?.   ?Diabetes ?Hypercholesterolemia ? ?Plan:  ? ?Weight management  - Continue current medication management with Ozempic.  Discussed methods to further help with dietary management. Has also just initiated exercise regimen.  Will f/u in 3 months. If more significant weight loss noted, can recheck labs then.  ? ? ? ? , MD ?[facility] ? ?

## 2021-10-26 ENCOUNTER — Encounter: Payer: Self-pay | Admitting: Obstetrics and Gynecology

## 2021-10-31 ENCOUNTER — Other Ambulatory Visit: Payer: Self-pay

## 2021-10-31 DIAGNOSIS — Z7689 Persons encountering health services in other specified circumstances: Secondary | ICD-10-CM

## 2021-10-31 NOTE — Telephone Encounter (Signed)
AMB referral placed.

## 2021-12-20 ENCOUNTER — Encounter (INDEPENDENT_AMBULATORY_CARE_PROVIDER_SITE_OTHER): Payer: Self-pay

## 2021-12-20 ENCOUNTER — Encounter: Payer: Self-pay | Admitting: Obstetrics and Gynecology

## 2021-12-21 ENCOUNTER — Encounter: Payer: Managed Care, Other (non HMO) | Admitting: Obstetrics and Gynecology

## 2021-12-22 ENCOUNTER — Encounter: Payer: Managed Care, Other (non HMO) | Admitting: Obstetrics and Gynecology

## 2022-01-10 ENCOUNTER — Encounter: Payer: Managed Care, Other (non HMO) | Admitting: Obstetrics and Gynecology

## 2022-01-19 ENCOUNTER — Ambulatory Visit: Payer: Managed Care, Other (non HMO) | Admitting: Physician Assistant

## 2022-01-24 ENCOUNTER — Ambulatory Visit: Payer: Managed Care, Other (non HMO) | Admitting: Family Medicine

## 2022-02-02 ENCOUNTER — Ambulatory Visit (INDEPENDENT_AMBULATORY_CARE_PROVIDER_SITE_OTHER): Payer: Managed Care, Other (non HMO) | Admitting: Obstetrics and Gynecology

## 2022-02-02 ENCOUNTER — Encounter: Payer: Self-pay | Admitting: Obstetrics and Gynecology

## 2022-02-02 VITALS — Ht 63.0 in | Wt 321.0 lb

## 2022-02-02 DIAGNOSIS — Z3200 Encounter for pregnancy test, result unknown: Secondary | ICD-10-CM

## 2022-02-02 LAB — POCT URINE PREGNANCY: Preg Test, Ur: POSITIVE — AB

## 2022-02-02 NOTE — Progress Notes (Signed)
Pt presents for pregnancy confirmation done on 09/22, UPT POS. [id number]. Pt states she isnot currently taking PNV. EDD 09/25/22 based on [facility] 08/08. [redacted]w[redacted]d. Pt to schedule f/u nurse visit, labs, etc. with front desk at checkout. Pt states she is currently taking vitex and would like to speak to provider in regards of if she should continue or discontinue usage.

## 2022-02-02 NOTE — Progress Notes (Signed)
Pt presents for pregnancy confirmation done on 09/22, UPT POS. [id number]. Pt states she isnot currently taking PNV. EDD 09/25/22 based on [facility] 08/08. [redacted]w[redacted]d. Pt to schedule f/u nurse visit, labs, etc. with front desk at checkout. All questions answered. Pt states she is currently taking vitex and would like to speak to provider in regards of if she should continue or discontinue usage.

## 2022-02-11 HISTORY — PX: OTHER SURGICAL HISTORY: SHX169

## 2022-02-13 ENCOUNTER — Ambulatory Visit: Payer: Managed Care, Other (non HMO)

## 2022-02-13 VITALS — Wt 321.0 lb

## 2022-02-13 DIAGNOSIS — Z3480 Encounter for supervision of other normal pregnancy, unspecified trimester: Secondary | ICD-10-CM

## 2022-02-13 DIAGNOSIS — O24911 Unspecified diabetes mellitus in pregnancy, first trimester: Secondary | ICD-10-CM

## 2022-02-13 DIAGNOSIS — Z369 Encounter for antenatal screening, unspecified: Secondary | ICD-10-CM

## 2022-02-13 DIAGNOSIS — O0990 Supervision of high risk pregnancy, unspecified, unspecified trimester: Secondary | ICD-10-CM | POA: Insufficient documentation

## 2022-02-13 DIAGNOSIS — Z3689 Encounter for other specified antenatal screening: Secondary | ICD-10-CM

## 2022-02-13 NOTE — Progress Notes (Unsigned)
New OB Intake  I connected with  Katherine Huynh on 02/13/22 at  2:15 PM EDT by telephone and verified that I am speaking with the correct person using two identifiers. Nurse is located at [employer] and pt is located at home.  I explained I am completing New OB Intake today. We discussed her EDD of 09/25/2022 that is based on LMP of 12/19/2021. Pt is G3/P0020. I reviewed her allergies, medications, Medical/Surgical/OB history, and appropriate screenings. Based on history, this is a/an pregnancy uncomplicated .   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  Anatomy US Explained first scheduled [place] will be around 20 weeks.  Labs Discussed Johnsie Cancel genetic screening with patient. Patient declines genetic testing.  Discussed possible labs to be drawn at new OB appointment.  COVID Vaccine Patient has not had COVID vaccine.   Social Determinants of Health Food Insecurity: denies food insecurity Transportation: Patient denies transportation needs.  First visit review I reviewed new OB appt with pt. I explained she will have ob bloodwork and pap smear/pelvic exam if indicated. Explained pt will be seen by Dr. Rubie Maid at  first visit; encounter routed to appropriate provider.   Cleophas Dunker, [place] 02/13/2022  2:35 PM

## 2022-02-14 ENCOUNTER — Encounter: Payer: Self-pay | Admitting: Obstetrics and Gynecology

## 2022-02-14 ENCOUNTER — Telehealth: Payer: Self-pay | Admitting: Obstetrics and Gynecology

## 2022-02-14 DIAGNOSIS — Z0189 Encounter for other specified special examinations: Secondary | ICD-10-CM

## 2022-02-14 NOTE — Telephone Encounter (Signed)
Reached out to patient to get her scheduled for future prenatal appointments. No answer, Left VM for patient to call the office for scheduling. Also sent mychart message.

## 2022-02-15 ENCOUNTER — Telehealth: Payer: Self-pay | Admitting: Obstetrics and Gynecology

## 2022-02-15 NOTE — Telephone Encounter (Signed)
Reached out to patient Via phone to schedule for dating/viability scan. No answer, Left vm for patient to contact the office for scheduling.

## 2022-02-27 ENCOUNTER — Other Ambulatory Visit: Payer: Managed Care, Other (non HMO)

## 2022-02-27 ENCOUNTER — Other Ambulatory Visit: Payer: Self-pay

## 2022-02-27 ENCOUNTER — Ambulatory Visit: Payer: Managed Care, Other (non HMO)

## 2022-02-27 DIAGNOSIS — Z3687 Encounter for antenatal screening for uncertain dates: Secondary | ICD-10-CM

## 2022-02-27 DIAGNOSIS — Z369 Encounter for antenatal screening, unspecified: Secondary | ICD-10-CM

## 2022-02-27 DIAGNOSIS — O24911 Unspecified diabetes mellitus in pregnancy, first trimester: Secondary | ICD-10-CM

## 2022-02-27 DIAGNOSIS — Z3A10 10 weeks gestation of pregnancy: Secondary | ICD-10-CM

## 2022-02-27 DIAGNOSIS — Z3480 Encounter for supervision of other normal pregnancy, unspecified trimester: Secondary | ICD-10-CM

## 2022-02-27 DIAGNOSIS — Z0189 Encounter for other specified special examinations: Secondary | ICD-10-CM

## 2022-02-27 LAB — OB RESULTS CONSOLE VARICELLA ZOSTER ANTIBODY, IGG: Varicella: IMMUNE

## 2022-02-28 LAB — MICROSCOPIC EXAMINATION
Casts: NONE SEEN /lpf
Epithelial Cells (non renal): >10 /hpf — AB (ref 0–10)

## 2022-02-28 LAB — URINALYSIS, ROUTINE W REFLEX MICROSCOPIC
Bilirubin, UA: NEGATIVE
Glucose, UA: NEGATIVE
Nitrite, UA: NEGATIVE
RBC, UA: NEGATIVE
Specific Gravity, UA: >=1.03 — AB (ref 1.005–1.030)
Urobilinogen, Ur: 0.2 mg/dL (ref 0.2–1.0)
pH, UA: 5.5 (ref 5.0–7.5)

## 2022-03-01 LAB — CBC/D/PLT+RPR+RH+ABO+RUBIGG...
Antibody Screen: NEGATIVE
Basophils Absolute: 0 10*3/uL (ref 0.0–0.2)
Basos: 0 %
EOS (ABSOLUTE): 0.2 10*3/uL (ref 0.0–0.4)
Eos: 3 %
HCV Ab: NONREACTIVE
HIV Screen 4th Generation wRfx: NONREACTIVE
Hematocrit: 38 % (ref 34.0–46.6)
Hemoglobin: 12.5 g/dL (ref 11.1–15.9)
Hepatitis B Surface Ag: NEGATIVE
Immature Grans (Abs): 0 10*3/uL (ref 0.0–0.1)
Immature Granulocytes: 0 %
Lymphocytes Absolute: 1.9 10*3/uL (ref 0.7–3.1)
Lymphs: 28 %
MCH: 27.2 pg (ref 26.6–33.0)
MCHC: 32.9 g/dL (ref 31.5–35.7)
MCV: 83 fL (ref 79–97)
Monocytes Absolute: 0.4 10*3/uL (ref 0.1–0.9)
Monocytes: 6 %
Neutrophils Absolute: 4.1 10*3/uL (ref 1.4–7.0)
Neutrophils: 63 %
Platelets: 311 10*3/uL (ref 150–450)
RBC: 4.59 x10E6/uL (ref 3.77–5.28)
RDW: 14.9 % (ref 11.7–15.4)
RPR Ser Ql: NONREACTIVE
Rh Factor: POSITIVE
Rubella Antibodies, IGG: 1.04 index (ref >0.99)
Varicella zoster IgG: 654 index (ref >165)
WBC: 6.6 10*3/uL (ref 3.4–10.8)

## 2022-03-01 LAB — HCV INTERPRETATION

## 2022-03-01 LAB — HEMOGLOBIN A1C
Est. average glucose Bld gHb Est-mCnc: 114 mg/dL
Hgb A1c MFr Bld: 5.6 % (ref 4.8–5.6)

## 2022-03-01 LAB — GC/CHLAMYDIA PROBE AMP
Chlamydia trachomatis, NAA: NEGATIVE
Neisseria Gonorrhoeae by PCR: NEGATIVE

## 2022-03-09 LAB — MONITOR DRUG PROFILE 14(MW)

## 2022-03-09 LAB — NICOTINE SCREEN, URINE

## 2022-03-13 ENCOUNTER — Ambulatory Visit: Payer: Managed Care, Other (non HMO) | Admitting: Obstetrics and Gynecology

## 2022-03-13 ENCOUNTER — Encounter: Payer: Self-pay | Admitting: Obstetrics and Gynecology

## 2022-03-13 VITALS — BP 101/65 | HR 81 | Ht 63.0 in | Wt 319.6 lb

## 2022-03-13 DIAGNOSIS — Z0283 Encounter for blood-alcohol and blood-drug test: Secondary | ICD-10-CM

## 2022-03-13 DIAGNOSIS — O99281 Endocrine, nutritional and metabolic diseases complicating pregnancy, first trimester: Secondary | ICD-10-CM

## 2022-03-13 DIAGNOSIS — I10 Essential (primary) hypertension: Secondary | ICD-10-CM

## 2022-03-13 DIAGNOSIS — O0990 Supervision of high risk pregnancy, unspecified, unspecified trimester: Secondary | ICD-10-CM

## 2022-03-13 DIAGNOSIS — Z8659 Personal history of other mental and behavioral disorders: Secondary | ICD-10-CM

## 2022-03-13 DIAGNOSIS — O99331 Smoking (tobacco) complicating pregnancy, first trimester: Secondary | ICD-10-CM

## 2022-03-13 DIAGNOSIS — O99891 Other specified diseases and conditions complicating pregnancy: Secondary | ICD-10-CM

## 2022-03-13 DIAGNOSIS — Z6841 Body Mass Index (BMI) 40.0 and over, adult: Secondary | ICD-10-CM

## 2022-03-13 DIAGNOSIS — O161 Unspecified maternal hypertension, first trimester: Secondary | ICD-10-CM

## 2022-03-13 DIAGNOSIS — Z3A09 9 weeks gestation of pregnancy: Secondary | ICD-10-CM

## 2022-03-13 DIAGNOSIS — O09292 Supervision of pregnancy with other poor reproductive or obstetric history, second trimester: Secondary | ICD-10-CM

## 2022-03-13 DIAGNOSIS — R7303 Prediabetes: Secondary | ICD-10-CM

## 2022-03-13 DIAGNOSIS — E282 Polycystic ovarian syndrome: Secondary | ICD-10-CM

## 2022-03-13 DIAGNOSIS — Z87891 Personal history of nicotine dependence: Secondary | ICD-10-CM

## 2022-03-13 LAB — POCT URINALYSIS DIPSTICK OB
Bilirubin, UA: NEGATIVE
Blood, UA: NEGATIVE
Glucose, UA: NEGATIVE
Ketones, UA: NEGATIVE
Leukocytes, UA: NEGATIVE
Nitrite, UA: NEGATIVE
Spec Grav, UA: 1.025 (ref 1.010–1.025)
Urobilinogen, UA: 0.2 E.U./dL
pH, UA: 6.5 (ref 5.0–8.0)

## 2022-03-13 NOTE — Patient Instructions (Signed)
Common Medications Safe in Pregnancy  Acne:      Constipation:  Benzoyl Peroxide     Colace  Clindamycin      Dulcolax Suppository  Topica Erythromycin     Fibercon  Salicylic Acid      Metamucil         Miralax AVOID:        Senakot   Accutane    Cough:  Retin-A       Cough Drops  Tetracycline      Phenergan w/ Codeine if Rx  Minocycline      Robitussin (Plain & DM)  Antibiotics:     Crabs/Lice:  Ceclor       RID  Cephalosporins    AVOID:  E-Mycins      Kwell  Keflex  Macrobid/Macrodantin   Diarrhea:  Penicillin      Kao-Pectate  Zithromax      Imodium AD         PUSH FLUIDS AVOID:       Cipro     Fever:  Tetracycline      Tylenol (Regular or Extra  Minocycline       Strength)  Levaquin      Extra Strength-Do not          Exceed 8 tabs/24 hrs Caffeine:        <200mg/day (equiv. To 1 cup of coffee or  approx. 3 12 oz sodas)         Gas: Cold/Hayfever:       Gas-X  Benadryl      Mylicon  Claritin       Phazyme  **Claritin-D        Chlor-Trimeton    Headaches:  Dimetapp      ASA-Free Excedrin  Drixoral-Non-Drowsy     Cold Compress  Mucinex (Guaifenasin)     Tylenol (Regular or Extra  Sudafed/Sudafed-12 Hour     Strength)  **Sudafed PE Pseudoephedrine   Tylenol Cold & Sinus     Vicks Vapor Rub  Zyrtec  **AVOID if Problems With Blood Pressure         Heartburn: Avoid lying down for at least 1 hour after meals  Aciphex      Maalox     Rash:  Milk of Magnesia     Benadryl    Mylanta       1% Hydrocortisone Cream  Pepcid  Pepcid Complete   Sleep Aids:  Prevacid      Ambien   Prilosec       Benadryl  Rolaids       Chamomile Tea  Tums (Limit 4/day)     Unisom         Tylenol PM         Warm milk-add vanilla or  Hemorrhoids:       Sugar for taste  Anusol/Anusol H.C.  (RX: Analapram 2.5%)  Sugar Substitutes:  Hydrocortisone OTC     Ok in moderation  Preparation H      Tucks        Vaseline lotion applied to tissue with  wiping    Herpes:     Throat:  Acyclovir      Oragel  Famvir  Valtrex     Vaccines:         Flu Shot Leg Cramps:       *Gardasil  Benadryl      Hepatitis A         Hepatitis B Nasal Spray:         Pneumovax  Saline Nasal Spray     Polio Booster         Tetanus Nausea:       Tuberculosis test or PPD  Vitamin B6 25 mg TID   AVOID:    Dramamine      *Gardasil  Emetrol       Live Poliovirus  Ginger Root 250 mg QID    MMR (measles, mumps &  High Complex Carbs @ Bedtime    rebella)  Sea Bands-Accupressure    Varicella (Chickenpox)  Unisom 1/2 tab TID     *No known complications           If received before Pain:         Known pregnancy;   Darvocet       Resume series after  Lortab        Delivery  Percocet    Yeast:   Tramadol      Femstat  Tylenol 3      Gyne-lotrimin  Ultram       Monistat  Vicodin           MISC:         All Sunscreens           Hair Coloring/highlights          Insect Repellant's          (Including DEET)         Mystic Tans me        Morning sickness is when you feel like you may vomit (feel nauseous) during pregnancy. Sometimes, you may vomit. Morning sickness most often happens in the morning, but it can also happen at any time of the day. Some women may have morning sickness that makes them vomit all the time. This is a more serious problem that needs treatment. What are the causes? The cause of this condition is not known. What increases the risk? You had vomiting or a feeling like you may vomit before your pregnancy. You had morning sickness in another pregnancy. You are pregnant with more than one baby, such as twins. What are the signs or symptoms? Feeling like you may vomit. Vomiting. How is this treated? Treatment is usually not needed for this condition. You may only need to change what you eat. In some cases, your doctor may give you some things to take for your condition. These include: Vitamin B6 supplements. Medicines to treat the  feeling that you may vomit. Ginger. Follow these instructions at home: Medicines Take over-the-counter and prescription medicines only as told by your doctor. Do not take any medicines until you talk with your doctor about them first. Take multivitamins before you get pregnant. These can stop or lessen the symptoms of morning sickness. Eating and drinking Eat dry toast or crackers before getting out of bed. Eat 5 or 6 small meals a day. Eat dry and bland foods like rice and baked potatoes. Do not eat greasy, fatty, or spicy foods. Have someone cook for you if the smell of food causes you to vomit or to feel like you may vomit. If you feel like you may vomit after taking prenatal vitamins, take them at night or with a snack. Eat protein foods when you need a snack. Nuts, yogurt, and cheese are good choices. Drink fluids throughout the day. Try ginger ale made with real ginger, ginger tea made from fresh grated ginger, or ginger candies. General instructions Do not smoke or use any products that  contain nicotine or tobacco. If you need help quitting, ask your doctor. Use an air purifier to keep the air in your house free of smells. Get lots of fresh air. Try to avoid smells that make you feel sick. Try wearing an acupressure wristband. This is a wristband that is used to treat seasickness. Try a treatment called acupuncture. In this treatment, a doctor puts needles into certain areas of your body to make you feel better. Contact a doctor if: You need medicine to feel better. You feel dizzy or light-headed. You are losing weight. Get help right away if: The feeling that you may vomit will not go away, or you cannot stop vomiting. You faint. You have very bad pain in your belly. Summary Morning sickness is when you feel like you may vomit (feel nauseous) during pregnancy. You may feel sick in the morning, but you can feel this way at any time of the day. Making some changes to what you  eat may help your symptoms go away.   This information is not intended to replace advice given to you by your health care provider. Make sure you discuss any questions you have with your health care provider. Document Revised: 12/14/2019 Document Reviewed: 11/23/2019 Elsevier Patient Education  [place].

## 2022-03-13 NOTE — Progress Notes (Unsigned)
OBSTETRIC INITIAL PRENATAL VISIT  Subjective:    Katherine Huynh is being seen today for her first obstetrical visit.  This is a planned pregnancy. Patient has desired pregnancy off and on for several years. She is a 26 y.o. G3P0020 female at [redacted]w[redacted]d gestation, Estimated Date of Delivery: 10/11/22 with last menstrual period 12/19/2021 (exact date), inconsistent with 7 week sono. Her obstetrical history is significant for obesity, PCOS, and history of prediabetes. Relationship with FOB: significant other, living together. Patient does intend to breast feed. Pregnancy history fully reviewed.    OB History  Gravida Para Term Preterm AB Living  3 0 0 0 2 0  SAB IAB Ectopic Multiple Live Births  2 0 0 0 0    # Outcome Date GA Lbr Len/2nd Weight Sex Delivery Anes PTL Lv  3 Current           2 SAB 2019          1 SAB 2017            Gynecologic History:  Last pap smear was 11/15/2020.  Results were Normal.  Denies h/o abnormal pap smears in the past.  Denies history of STIs.  Contraception prior to conception:    Past Medical History:  Diagnosis Date   Anemia    Anxiety    Bilateral swelling of feet    Constipation    Depression    Fam hx-ischem heart disease 09/15/2015   Heart murmur    Infertility, female    Morbid obesity (HCC) 02/17/2016   Multiple food allergies    Other fatigue    Polycystic disease, ovaries    Prediabetes    Shortness of breath on exertion    Vitamin D deficiency     Family History  Problem Relation Age of Onset   Obesity Mother    Polycystic ovary syndrome Mother    Hypertension Father    Hyperlipidemia Father    Sleep apnea Father    Obesity Father    Alcohol abuse Sister    ADD / ADHD Sister    Cancer Maternal Grandmother        breast   Fibroids Maternal Grandmother    Uterine cancer Maternal Grandmother 75   Heart disease Maternal Grandfather    Heart disease Paternal Grandfather     Past Surgical History:  Procedure Laterality Date    DG HYSTEROGRAM (HSD)  01/13/2020       HYSTEROSCOPY WITH D & C N/A 11/07/2020   Procedure: DILATATION AND CURETTAGE /HYSTEROSCOPY;  Surgeon: Hildred Laser, MD;  Location: [facility] ORS;  Service: Gynecology;  Laterality: N/A;    Social History   Socioeconomic History   Marital status: Significant Other    Spouse name: [name]    Number of children: 0   Years of education: 14   Highest education level: High school graduate  Occupational History   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  Tobacco Use   Smoking status: Former    Types: Cigarettes    Quit date: 09/28/2016    Years since quitting: 5.4   Smokeless tobacco: Never  Vaping Use   Vaping Use: Former   Quit date: 10/14/2021   Substances: Nicotine, Flavoring  Substance and Sexual Activity   Alcohol use: Not Currently    Alcohol/week: 6.0 standard drinks of alcohol    Types: 3 Glasses of wine, 2 Cans of beer, 1 Shots of liquor per week    Comment: occas   Drug use: Not Currently  Types: Marijuana    Comment: last used about a month ago   Sexual activity: Yes    Partners: Male    Birth control/protection: None  Other Topics Concern   Not on file  Social History Narrative   Lives with fiancee, they live together, works full time.    Social Determinants of Health   Financial Resource Strain: Low Risk  (02/13/2022)   Overall Financial Resource Strain (CARDIA)    Difficulty of Paying Living Expenses: Not very hard  Food Insecurity: No Food Insecurity (02/13/2022)   Hunger Vital Sign    Worried About Running Out of Food in the Last Year: Never true    Ran Out of Food in the Last Year: Never true  Transportation Needs: No Transportation Needs (02/13/2022)   PRAPARE - Hydrologist (Medical): No    Lack of Transportation (Non-Medical): No  Physical Activity: Insufficiently Active (02/13/2022)   Exercise Vital Sign    Days of Exercise per Week: 2 days    Minutes of Exercise per Session: 20 min  Stress: No  Stress Concern Present (02/13/2022)   [place]    Feeling of Stress : Not at all  Social Connections: Moderately Isolated (02/13/2022)   Social Connection and Isolation Panel [NHANES]    Frequency of Communication with Friends and Family: More than three times a week    Frequency of Social Gatherings with Friends and Family: Once a week    Attends Religious Services: Never    Marine scientist or Organizations: No    Attends Archivist Meetings: Never    Marital Status: Living with partner  Intimate Partner Violence: Not At Risk (02/13/2022)   Humiliation, Afraid, Rape, and Kick questionnaire    Fear of Current or Ex-Partner: No    Emotionally Abused: No    Physically Abused: No    Sexually Abused: No    Current Outpatient Medications on File Prior to Visit  Medication Sig Dispense Refill   diphenhydrAMINE (BENADRYL) 25 mg capsule Take 1 capsule by mouth as needed.     fluticasone (FLOVENT HFA) 220 MCG/ACT inhaler Inhale 1 puff into the lungs as needed.     prenatal vitamin w/FE, FA (PRENATAL 1 + 1) 27-1 MG TABS tablet Take 1 tablet by mouth daily at 12 noon.     No current facility-administered medications on file prior to visit.    Allergies  Allergen Reactions   Cinnamon Itching   Erythromycin Rash   Sulfa Antibiotics Rash     Review of Systems General: Not Present- Fever, Weight Loss and Weight Gain. Skin: Not Present- Rash. HEENT: Not Present- Blurred Vision, Headache and Bleeding Gums. Respiratory: Not Present- Difficulty Breathing. Breast: Not Present- Breast Mass. Cardiovascular: Not Present- Chest Pain, Elevated Blood Pressure, Fainting / Blacking Out and Shortness of Breath. Gastrointestinal: Not Present- Abdominal Pain, Constipation. Present - Nausea and Vomiting. Female Genitourinary: Not Present- Frequency, Painful Urination, Pelvic Pain, Vaginal Bleeding, Vaginal Discharge,  Contractions, regular, Fetal Movements Decreased, Urinary Complaints and Vaginal Fluid. Musculoskeletal: Not Present- Back Pain and Leg Cramps. Neurological: Not Present- Dizziness. Psychiatric: Not Present- Depression.     Objective:   Last menstrual period 12/19/2021.  There is no height or weight on file to calculate BMI.  General Appearance:    Alert, cooperative, no distress, appears stated age, morbid obesity  Head:    Normocephalic, without obvious abnormality, atraumatic  Eyes:  PERRL, conjunctiva/corneas clear, EOM's intact, both eyes  Ears:    Normal external ear canals, both ears  Nose:   Nares normal, septum midline, mucosa normal, no drainage or sinus tenderness  Throat:   Lips, mucosa, and tongue normal; teeth and gums normal  Neck:   Supple, symmetrical, trachea midline, no adenopathy; thyroid: no enlargement/tenderness/nodules; no carotid bruit or JVD  Back:     Symmetric, no curvature, ROM normal, no CVA tenderness  Lungs:     Clear to auscultation bilaterally, respirations unlabored  Chest Wall:    No tenderness or deformity   Heart:    Regular rate and rhythm, S1 and S2 normal, no murmur, rub or gallop  Breast Exam:    No tenderness, masses, or nipple abnormality  Abdomen:     Soft, non-tender, bowel sounds active all four quadrants, no masses, no organomegaly.  FHT 180 bpm (performed with bedside sono due to body habitus and early gestational age).  Genitalia:    Pelvic:external genitalia normal, vagina without lesions, discharge, or tenderness. Cervix with no cervical motion tenderness, no adnexal masses or tenderness.   Rectal:    Normal external sphincter.  No hemorrhoids appreciated. Internal exam not done.   Extremities:   Extremities normal, atraumatic, no cyanosis or edema  Pulses:   2+ and symmetric all extremities  Skin:   Skin color, texture, turgor normal, no rashes or lesions  Lymph nodes:   Cervical, supraclavicular, and axillary nodes normal   Neurologic:   CNII-XII intact, normal strength, sensation and reflexes throughout     Assessment:   1. Supervision of high risk pregnancy, antepartum   2. [redacted] weeks gestation of pregnancy   3. Encounter for drug screening   4. Prediabetes   5. Body mass index (BMI) of 50-59.9 in adult (HCC)   6. Primary hypertension   7. Polycystic disease, ovaries   8. History of depression   9. History of anxiety     Plan:   Supervision of high risk pregnancy, [redacted] weeks gestation - Initial labs reviewed. - Prenatal vitamins encouraged. - Problem list reviewed and updated. - New OB counseling:  The patient has been given an overview regarding routine prenatal care.   - Prenatal testing, optional genetic testing, and ultrasound use in pregnancy were reviewed.  Traditional genetic screening vs cell-fee DNA genetic screening discussed, including risks and benefits. Testing declined. - Benefits of Breast Feeding were discussed. The patient is encouraged to consider nursing her baby post partum.   3. Encounter for drug screening - UDS ordered for routine prenatal labs (unresulted last visit).   4. Prediabetes -  Recent HgbA1c 5.6 (upper limits of normal). Will screen for GDM around 24-26 weeks  5. Body mass index (BMI) of 50-59.9 in adult [facility]) - Discussed risks of obesity in pregnancy. - Advised on need for serial growth scans beginning in late 2nd trimester and antenatal testing beginning in 3rd trimester.  - Discussed need for Anesthesia consult. Will refer - Recommendations regarding diet, weight gain, and exercise in pregnancy were given. Advised on no more than 10-15 lbs for entire pregnancy.   6. Primary hypertension - H/o HTN, has not required medications in some time. Will monitor during pregnancy.   7. Polycystic disease, ovaries  - Patient at increased risk for metabolic issues (specifically DM, HTN).    8. History of anxiety and depression - H/o mood disorder, currently on no  medications. Screen with PHQ-9 and GAD 7.   Follow u p in  4 weeks.    Hildred Laser, MD [facility]

## 2022-03-14 MED ORDER — BONJESTA 20-20 MG PO TBCR: 1.0000 | EXTENDED_RELEASE_TABLET | Freq: Two times a day (BID) | ORAL | 1 refills | Status: DC

## 2022-03-14 MED ORDER — ASPIRIN 81 MG PO TBEC: 81.0000 mg | DELAYED_RELEASE_TABLET | Freq: Every day | ORAL | 2 refills | Status: DC

## 2022-03-20 ENCOUNTER — Other Ambulatory Visit: Payer: Self-pay

## 2022-03-20 ENCOUNTER — Encounter: Payer: Self-pay | Admitting: Obstetrics and Gynecology

## 2022-03-20 DIAGNOSIS — O219 Vomiting of pregnancy, unspecified: Secondary | ICD-10-CM

## 2022-03-20 MED ORDER — BONJESTA 20-20 MG PO TBCR: EXTENDED_RELEASE_TABLET | ORAL | 0 refills | Status: DC

## 2022-04-03 ENCOUNTER — Encounter: Payer: Self-pay | Admitting: Obstetrics and Gynecology

## 2022-04-09 ENCOUNTER — Telehealth: Payer: Self-pay

## 2022-04-09 NOTE — Telephone Encounter (Signed)
Patient calling in to state anxiety about a dental implant appointment. She states she has been instructed by her dental office that this is a safe procedure in pregnancy but patient does not feel the same way. Reminded patient of appointment tomorrow with Dr. Valentino Saxon. She states she is going to reschedule her appointment and wait until after her OB appointment.

## 2022-04-10 ENCOUNTER — Ambulatory Visit: Payer: Managed Care, Other (non HMO) | Admitting: Obstetrics and Gynecology

## 2022-04-10 ENCOUNTER — Encounter: Payer: Self-pay | Admitting: Obstetrics and Gynecology

## 2022-04-10 VITALS — BP 110/56 | HR 89 | Wt 317.4 lb

## 2022-04-10 DIAGNOSIS — I10 Essential (primary) hypertension: Secondary | ICD-10-CM

## 2022-04-10 DIAGNOSIS — O99341 Other mental disorders complicating pregnancy, first trimester: Secondary | ICD-10-CM

## 2022-04-10 DIAGNOSIS — E282 Polycystic ovarian syndrome: Secondary | ICD-10-CM

## 2022-04-10 DIAGNOSIS — Z3A13 13 weeks gestation of pregnancy: Secondary | ICD-10-CM

## 2022-04-10 DIAGNOSIS — O219 Vomiting of pregnancy, unspecified: Secondary | ICD-10-CM

## 2022-04-10 DIAGNOSIS — F411 Generalized anxiety disorder: Secondary | ICD-10-CM

## 2022-04-10 DIAGNOSIS — Z6841 Body Mass Index (BMI) 40.0 and over, adult: Secondary | ICD-10-CM

## 2022-04-10 DIAGNOSIS — O0990 Supervision of high risk pregnancy, unspecified, unspecified trimester: Secondary | ICD-10-CM

## 2022-04-10 DIAGNOSIS — F32A Depression, unspecified: Secondary | ICD-10-CM

## 2022-04-10 DIAGNOSIS — R7303 Prediabetes: Secondary | ICD-10-CM

## 2022-04-10 LAB — POCT URINALYSIS DIPSTICK OB
Bilirubin, UA: NEGATIVE
Blood, UA: NEGATIVE
Glucose, UA: NEGATIVE
Leukocytes, UA: NEGATIVE
Nitrite, UA: NEGATIVE
Spec Grav, UA: 1.025 (ref 1.010–1.025)
Urobilinogen, UA: 0.2 E.U./dL
pH, UA: 6.5 (ref 5.0–8.0)

## 2022-04-10 MED ORDER — ONDANSETRON 4 MG PO TBDP: 4.0000 mg | ORAL_TABLET | Freq: Three times a day (TID) | ORAL | 2 refills | Status: DC | PRN

## 2022-04-10 NOTE — Patient Instructions (Signed)

## 2022-04-10 NOTE — Progress Notes (Signed)
ROB [redacted]w[redacted]d: She is doing well today. She was taking Bonjesta for nausea and vomiting and she had an allergic reaction to it. She still has been bothered with nausea and vomiting and would like another medication prescribed to her.

## 2022-04-10 NOTE — Progress Notes (Signed)
ROB: Notes nausea and vomiting, had allergic reaction to Bonjesta (itching, hives, and worsening of her nausea/vomting) so discontinued. Will prescribe Zofran.  Discussed need for Anesthesia referral, will place.  Is taking daily baby aspirin.  Declines  all genetics. Negative baseline PHQ-9 and GAD today, asymptomatic. RTC in 4 weeks. Referral to MFM for anatomy scan placed.

## 2022-04-11 ENCOUNTER — Encounter: Payer: Self-pay | Admitting: Obstetrics and Gynecology

## 2022-04-27 ENCOUNTER — Emergency Department: Payer: Managed Care, Other (non HMO)

## 2022-04-27 ENCOUNTER — Emergency Department
Admission: EM | Admit: 2022-04-27 | Discharge: 2022-04-27 | Disposition: A | Discharge status: Home / Self Care | Payer: Managed Care, Other (non HMO) | Attending: Emergency Medicine | Admitting: Emergency Medicine

## 2022-04-27 ENCOUNTER — Other Ambulatory Visit: Payer: Self-pay

## 2022-04-27 DIAGNOSIS — R112 Nausea with vomiting, unspecified: Secondary | ICD-10-CM

## 2022-04-27 DIAGNOSIS — Z3492 Encounter for supervision of normal pregnancy, unspecified, second trimester: Secondary | ICD-10-CM

## 2022-04-27 DIAGNOSIS — O26892 Other specified pregnancy related conditions, second trimester: Secondary | ICD-10-CM | POA: Diagnosis not present

## 2022-04-27 DIAGNOSIS — E119 Type 2 diabetes mellitus without complications: Secondary | ICD-10-CM | POA: Insufficient documentation

## 2022-04-27 DIAGNOSIS — R102 Pelvic and perineal pain: Secondary | ICD-10-CM | POA: Insufficient documentation

## 2022-04-27 DIAGNOSIS — O162 Unspecified maternal hypertension, second trimester: Secondary | ICD-10-CM | POA: Diagnosis not present

## 2022-04-27 DIAGNOSIS — O219 Vomiting of pregnancy, unspecified: Secondary | ICD-10-CM | POA: Insufficient documentation

## 2022-04-27 DIAGNOSIS — O99282 Endocrine, nutritional and metabolic diseases complicating pregnancy, second trimester: Secondary | ICD-10-CM | POA: Diagnosis not present

## 2022-04-27 DIAGNOSIS — Z1152 Encounter for screening for COVID-19: Secondary | ICD-10-CM | POA: Insufficient documentation

## 2022-04-27 DIAGNOSIS — R197 Diarrhea, unspecified: Secondary | ICD-10-CM | POA: Insufficient documentation

## 2022-04-27 DIAGNOSIS — Z3A16 16 weeks gestation of pregnancy: Secondary | ICD-10-CM | POA: Insufficient documentation

## 2022-04-27 LAB — CBC WITH DIFFERENTIAL/PLATELET
Abs Immature Granulocytes: 0.03 10*3/uL (ref 0.00–0.07)
Basophils Absolute: 0 10*3/uL (ref 0.0–0.1)
Basophils Relative: 0 %
Eosinophils Absolute: 0.2 10*3/uL (ref 0.0–0.5)
Eosinophils Relative: 2 %
HCT: 37.9 % (ref 36.0–46.0)
Hemoglobin: 12.1 g/dL (ref 12.0–15.0)
Immature Granulocytes: 0 %
Lymphocytes Relative: 16 %
Lymphs Abs: 1.3 10*3/uL (ref 0.7–4.0)
MCH: 27.4 pg (ref 26.0–34.0)
MCHC: 31.9 g/dL (ref 30.0–36.0)
MCV: 85.7 fL (ref 80.0–100.0)
Monocytes Absolute: 0.3 10*3/uL (ref 0.1–1.0)
Monocytes Relative: 4 %
Neutro Abs: 6.4 10*3/uL (ref 1.7–7.7)
Neutrophils Relative %: 78 %
Platelets: 277 10*3/uL (ref 150–400)
RBC: 4.42 MIL/uL (ref 3.87–5.11)
RDW: 14.6 % (ref 11.5–15.5)
WBC: 8.3 10*3/uL (ref 4.0–10.5)
nRBC: 0 % (ref 0.0–0.2)

## 2022-04-27 LAB — BASIC METABOLIC PANEL
Anion gap: 9 (ref 5–15)
BUN: 6 mg/dL (ref 6–20)
CO2: 22 mmol/L (ref 22–32)
Calcium: 9 mg/dL (ref 8.9–10.3)
Chloride: 106 mmol/L (ref 98–111)
Creatinine, Ser: 0.68 mg/dL (ref 0.44–1.00)
GFR, Estimated: >60 mL/min (ref >60)
Glucose, Bld: 81 mg/dL (ref 70–99)
Potassium: 3.9 mmol/L (ref 3.5–5.1)
Sodium: 137 mmol/L (ref 135–145)

## 2022-04-27 LAB — URINALYSIS, ROUTINE W REFLEX MICROSCOPIC
Bacteria, UA: NONE SEEN
Bilirubin Urine: NEGATIVE
Glucose, UA: NEGATIVE mg/dL
Hgb urine dipstick: NEGATIVE
Ketones, ur: 80 mg/dL — AB
Nitrite: NEGATIVE
Protein, ur: 30 mg/dL — AB
Specific Gravity, Urine: 1.029 (ref 1.005–1.030)
pH: 5 (ref 5.0–8.0)

## 2022-04-27 LAB — RESP PANEL BY RT-PCR (RSV, FLU A&B, COVID)  RVPGX2
Influenza A by PCR: NEGATIVE
Influenza B by PCR: NEGATIVE
Resp Syncytial Virus by PCR: NEGATIVE
SARS Coronavirus 2 by RT PCR: NEGATIVE

## 2022-04-27 LAB — HCG, QUANTITATIVE, PREGNANCY: hCG, Beta Chain, Quant, S: 24155 m[IU]/mL — ABNORMAL HIGH (ref <5)

## 2022-04-27 MED ORDER — LACTATED RINGERS IV BOLUS
1000.0000 mL | Freq: Once | INTRAVENOUS | Status: AC
  Administered 2022-04-27: 1000 mL via INTRAVENOUS

## 2022-04-27 MED ORDER — ONDANSETRON HCL 4 MG/2ML IJ SOLN
4.0000 mg | Freq: Once | INTRAMUSCULAR | Status: AC
  Administered 2022-04-27: 4 mg via INTRAVENOUS
  Filled 2022-04-27: qty 2

## 2022-04-27 MED ORDER — ONDANSETRON 4 MG PO TBDP: 4.0000 mg | ORAL_TABLET | Freq: Three times a day (TID) | ORAL | 0 refills | Status: DC | PRN

## 2022-04-27 NOTE — Discharge Instructions (Signed)
Follow-up with your OB/GYN if you continue to feel nauseated or are vomiting.  Return to the emergency department for symptoms that change or worsen if you are unable to schedule appointment.

## 2022-04-27 NOTE — ED Triage Notes (Addendum)
Pt presents to ED with concerns for dehydration. Pt states she has been vomiting and having diarrhea all day. Pt states she is [redacted] weeks pregnant at this time. NAD noted. VSS.    Pt states she took Zofran ODT at home and vomited back up.

## 2022-04-27 NOTE — ED Provider Notes (Signed)
[facility] Provider Note    Event Date/Time   First MD Initiated Contact with Patient 04/27/22 1912     (approximate)   History   Emesis During Pregnancy   HPI  Katherine Huynh is a 26 y.o. female with history of hypertension, diabetes, and as listed in the EMR presents to the emergency department for treatment and evaluation of nausea, vomiting, and diarrhea today. She is [redacted] weeks pregnant. She has had some pelvic cramping, but no bleeding or discharge. No fever.        Physical Exam   Triage Vital Signs: ED Triage Vitals [04/27/22 1824]  Enc Vitals Group     BP 130/82     Pulse Rate 86     Resp 18     Temp 98.2 F (36.8 C)     Temp Source Oral     SpO2 100 %     Weight      Height      Head Circumference      Peak Flow      Pain Score      Pain Loc      Pain Edu?      Excl. in GC?     Most recent vital signs: Vitals:   04/27/22 2119 04/27/22 2153  BP:  129/68  Pulse: 86 91  Resp: 19 17  Temp:    SpO2: 99% 98%     General: Awake, no distress.  CV:  Good peripheral perfusion.  Resp:  Normal effort.  Abd:  No distention.  Other:    ED Results / Procedures / Treatments   Labs (all labs ordered are listed, but only abnormal results are displayed) Labs Reviewed  URINALYSIS, ROUTINE W REFLEX MICROSCOPIC - Abnormal; Notable for the following components:      Result Value   Color, Urine AMBER (*)    APPearance CLOUDY (*)    Ketones, ur 80 (*)    Protein, ur 30 (*)    Leukocytes,Ua SMALL (*)    All other components within normal limits  HCG, QUANTITATIVE, PREGNANCY - Abnormal; Notable for the following components:   hCG, Beta Chain, Quant, S 24,155 (*)    All other components within normal limits  RESP PANEL BY RT-PCR (RSV, FLU A&B, COVID)  RVPGX2  BASIC METABOLIC PANEL  CBC WITH DIFFERENTIAL/PLATELET     EKG     RADIOLOGY  US shows single IUP 16 weeks 1 day; fetal heart rate 155.  PROCEDURES:  Critical Care  performed: No  Procedures   MEDICATIONS ORDERED IN ED: Medications  lactated ringers bolus 1,000 mL (0 mLs Intravenous Stopped 04/27/22 2116)  ondansetron (ZOFRAN) injection 4 mg (4 mg Intravenous Given 04/27/22 1958)     IMPRESSION / MDM / ASSESSMENT AND PLAN / ED COURSE  I reviewed the triage vital signs and the nursing notes.                              Differential diagnosis includes, but is not limited to, dehydration, COVID, influenza, RSV, gastroenteritis, pregnancy complication.  Patient's presentation is most consistent with acute complicated illness / injury requiring diagnostic workup.  26 year old female presenting to the emergency department for treatment and evaluation of symptoms as described in the HPI.  COVID, influenza, and RSV are negative.  BMP is normal.  CBC is normal.  Urine shows a small amount of leukocytes with 30 protein and 80 ketones.  Patient was given a liter of lactated Ringer's.  Ultrasound shows a single IUP with fetal heart rate of 155 and she is measuring 16 weeks 1 day.  Pelvic cramping has resolved.  Patient will be discharged home with a prescription for Zofran to be taken if the nausea or vomiting returns.  She is to call and schedule follow-up appointment with her OB/GYN.  She is safe for discharge at this time.     FINAL CLINICAL IMPRESSION(S) / ED DIAGNOSES   Final diagnoses:  Nausea vomiting and diarrhea  Second trimester pregnancy     Rx / DC Orders   ED Discharge Orders          Ordered    ondansetron (ZOFRAN-ODT) 4 MG disintegrating tablet  Every 8 hours PRN        04/27/22 2140             Note:  This document was prepared using Dragon voice recognition software and may include unintentional dictation errors.   Chinita Pester, FNP 04/27/22 Phineas Douglas    Jene Every, MD 04/30/22 2329

## 2022-04-30 ENCOUNTER — Encounter
Admission: RE | Admit: 2022-04-30 | Discharge: 2022-04-30 | Disposition: A | Discharge status: Home / Self Care | Payer: Managed Care, Other (non HMO) | Source: Ambulatory Visit | Attending: Anesthesiology | Admitting: Anesthesiology

## 2022-04-30 NOTE — Consult Note (Addendum)
[facility] Anesthesia Consultation  Katherine Huynh [id number] DOB: 03-27-1996 DOA: 04/30/2022 PCP: Patient, No Pcp Per   Requesting physician: Rubie Maid Date of consultation: 12.18.23 Reason for consultation: Obesity during pregnancy  CHIEF COMPLAINT:  Obesity during pregnancy  HISTORY OF PRESENT ILLNESS: Katherine Huynh  is a 26 y.o. female with a known history of asthma, morbid obesity  PAST MEDICAL HISTORY:   Past Medical History:  Diagnosis Date   Anemia    Anxiety    Bilateral swelling of feet    Constipation    Depression    Fam hx-ischem heart disease 09/15/2015   Heart murmur    Infertility, female    Morbid obesity ([place]) 02/17/2016   Multiple food allergies    Other fatigue    Polycystic disease, ovaries    Prediabetes    Shortness of breath on exertion    Vitamin D deficiency     PAST SURGICAL HISTORY:  Past Surgical History:  Procedure Laterality Date   DG HYSTEROGRAM (HSD)  01/13/2020       HYSTEROSCOPY WITH D & C N/A 11/07/2020   Procedure: DILATATION AND CURETTAGE [place];  Surgeon: Rubie Maid, MD;  Location: [facility] ORS;  Service: Gynecology;  Laterality: N/A;    SOCIAL HISTORY:  Social History   Tobacco Use   Smoking status: Former    Types: Cigarettes    Quit date: 09/28/2016    Years since quitting: 5.5   Smokeless tobacco: Never  Substance Use Topics   Alcohol use: Not Currently    Alcohol/week: 6.0 standard drinks of alcohol    Types: 3 Glasses of wine, 2 Cans of beer, 1 Shots of liquor per week    Comment: occas    FAMILY HISTORY:  Family History  Problem Relation Age of Onset   Obesity Mother    Polycystic ovary syndrome Mother    Hypertension Father    Hyperlipidemia Father    Sleep apnea Father    Obesity Father    Alcohol abuse Sister    ADD / ADHD Sister    Cancer Maternal Grandmother        breast   Fibroids Maternal Grandmother    Uterine cancer Maternal Grandmother 82    Heart disease Maternal Grandfather    Heart disease Paternal Grandfather     DRUG ALLERGIES:  Allergies  Allergen Reactions   Cinnamon Itching   Bonjesta [Doxylamine-Pyridoxine] Hives and Itching   Erythromycin Rash   Sulfa Antibiotics Rash    REVIEW OF SYSTEMS:   RESPIRATORY: No cough, shortness of breath, wheezing.  CARDIOVASCULAR: No chest pain, orthopnea, edema.  HEMATOLOGY: No anemia, easy bruising or bleeding SKIN: No rash or lesion. NEUROLOGIC: No tingling, numbness, weakness.  PSYCHIATRY: No anxiety or depression.   MEDICATIONS AT HOME:  Prior to Admission medications   Medication Sig Start Date End Date Taking? Authorizing Provider  aspirin EC 81 MG tablet Take 1 tablet (81 mg total) by mouth daily. Take after 12 weeks for prevention of preeclampssia later in pregnancy 03/14/22   Rubie Maid, MD  diphenhydrAMINE (BENADRYL) 25 mg capsule Take 1 capsule by mouth as needed.    [provider]  fluticasone (FLOVENT HFA) 220 MCG/ACT inhaler Inhale 1 puff into the lungs as needed. 03/28/21 03/28/22  [provider]  ondansetron (ZOFRAN-ODT) 4 MG disintegrating tablet Take 1 tablet (4 mg total) by mouth every 8 (eight) hours as needed for nausea or vomiting. 04/27/22   Triplett, Johnette Abraham B, FNP  oxyCODONE-acetaminophen (PERCOCET/ROXICET) 5-325  MG tablet Take 1-2 tablets by mouth every 4 (four) hours as needed. 04/09/22   [provider]  prenatal vitamin w/FE, FA (PRENATAL 1 + 1) 27-1 MG TABS tablet Take 1 tablet by mouth daily at 12 noon.    [provider]      PHYSICAL EXAMINATION:   VITAL SIGNS: Height 5\' 3"  (1.6 m), weight (!) 142.8 kg, last menstrual period 12/19/2021.  GENERAL:  26 y.o.-year-old patient no acute distress.  HEENT: Head atraumatic, normocephalic. Oropharynx and nasopharynx clear. MP 2, TM distance >3 cm, normal mouth opening. LUNGS: No use of accessory muscles of respiration.   EXTREMITIES: No pedal edema, cyanosis, or  clubbing.  NEUROLOGIC: normal gait PSYCHIATRIC: The patient is alert and oriented x 3.  SKIN: No obvious rash, lesion, or ulcer.    IMPRESSION AND PLAN:   Katherine Huynh  is a 26 y.o. female presenting with obesity during pregnancy. BMI is currently 55.78 at [redacted] weeks gestation.   We discussed analgesic options during labor including epidural analgesia. Discussed that in obesity there can be increased difficulty with epidural placement or even failure of successful epidural. We also discussed that even after successful epidural placement there is increased risk of catheter migration out of the epidural space that would require catheter replacement. Discussed use of epidural vs spinal vs GA if cesarean delivery is required. Discussed increased risk of difficult intubation during pregnancy should an emergency cesarean delivery be required.   This patient is appropriate for anesthetic care at [facility] in [place].  We discussed the limitations of a community hospital including but not limited to staffing, imaging, medications, and blood products.  The patient is aware of these limitations.  All questions answered and concerns addressed.    Recommend repeat visit at 32 weeks.   [place], MD Anesthesiology

## 2022-05-08 ENCOUNTER — Encounter: Payer: Managed Care, Other (non HMO) | Admitting: Obstetrics and Gynecology

## 2022-05-08 ENCOUNTER — Telehealth: Payer: Self-pay

## 2022-05-08 NOTE — Telephone Encounter (Signed)
Pt calling for proof of preg; adv it will be in her MyChart.

## 2022-05-11 ENCOUNTER — Encounter: Payer: Self-pay | Admitting: Obstetrics and Gynecology

## 2022-05-11 ENCOUNTER — Ambulatory Visit (INDEPENDENT_AMBULATORY_CARE_PROVIDER_SITE_OTHER): Payer: Managed Care, Other (non HMO) | Admitting: Obstetrics and Gynecology

## 2022-05-11 VITALS — BP 127/73 | HR 86 | Wt 312.3 lb

## 2022-05-11 DIAGNOSIS — O99210 Obesity complicating pregnancy, unspecified trimester: Secondary | ICD-10-CM

## 2022-05-11 DIAGNOSIS — O26892 Other specified pregnancy related conditions, second trimester: Secondary | ICD-10-CM

## 2022-05-11 DIAGNOSIS — O0990 Supervision of high risk pregnancy, unspecified, unspecified trimester: Secondary | ICD-10-CM

## 2022-05-11 DIAGNOSIS — O219 Vomiting of pregnancy, unspecified: Secondary | ICD-10-CM

## 2022-05-11 DIAGNOSIS — Z3A18 18 weeks gestation of pregnancy: Secondary | ICD-10-CM

## 2022-05-11 DIAGNOSIS — I10 Essential (primary) hypertension: Secondary | ICD-10-CM

## 2022-05-11 LAB — POCT URINALYSIS DIPSTICK OB
Bilirubin, UA: NEGATIVE
Blood, UA: NEGATIVE
Glucose, UA: NEGATIVE
Ketones, UA: NEGATIVE
Leukocytes, UA: NEGATIVE
Nitrite, UA: NEGATIVE
Spec Grav, UA: 1.025 (ref 1.010–1.025)
Urobilinogen, UA: 0.2 E.U./dL
pH, UA: 6 (ref 5.0–8.0)

## 2022-05-11 MED ORDER — ONDANSETRON 4 MG PO TBDP: 4.0000 mg | ORAL_TABLET | Freq: Three times a day (TID) | ORAL | 2 refills | Status: DC | PRN

## 2022-05-11 NOTE — Progress Notes (Signed)
ROB: Patient is a 26 y.o. G3P0020 at [redacted]w[redacted]d. She s/p recent visit to ER yesterday due to nausea and vomiting, and mild dehydration.  Patient reports she ran out of her nausea medication and did not have any refills. Will send refills of Zofran. Also c/o heartburn, not relieved by Tums. Discussed use of Pepicd or Zantac OTC.  RTC in 4 weeks, anatomy scan scheduled for next week.

## 2022-05-11 NOTE — Progress Notes (Signed)
ROB [redacted]w[redacted]d: She is doing well. She feels only flutters for movement. She has really bad heartburn.

## 2022-05-12 ENCOUNTER — Encounter: Payer: Self-pay | Admitting: Obstetrics and Gynecology

## 2022-05-12 DIAGNOSIS — O219 Vomiting of pregnancy, unspecified: Secondary | ICD-10-CM | POA: Insufficient documentation

## 2022-05-12 DIAGNOSIS — O26892 Other specified pregnancy related conditions, second trimester: Secondary | ICD-10-CM | POA: Insufficient documentation

## 2022-05-14 NOTE — L&D Delivery Note (Signed)
Delivery Summary for Katherine Huynh  Labor Events:   Preterm labor: No data found  Rupture date: 09/28/2022  Rupture time: 4:52 PM  Rupture type: Spontaneous  Fluid Color: Clear  Induction: No data found  Augmentation: No data found  Complications: No data found  Cervical ripening: No data found No data found   No data found     Delivery:   Episiotomy: No data found  Lacerations: No data found  Repair suture: No data found  Repair # of packets: No data found  Blood loss (ml): No data found   Information for the patient's newborn:  [name], [name] [[id number]]   Delivery 09/29/2022 9:51 AM by  C-Section, Vacuum Assisted Sex:  female Gestational Age: [redacted]w[redacted]d Delivery Clinician:   Living?:         APGARS  One minute Five minutes Ten minutes  Skin color:        Heart rate:        Grimace:        Muscle tone:        Breathing:        Totals: 8  9      Presentation/position:      Resuscitation:   Cord information:    Disposition of cord blood:     Blood gases sent?  Complications:   Placenta: Delivered:       appearance Newborn Measurements: Weight: 7 lb 1.2 oz (3210 g)  Height: 20.08"  Head circumference:    Chest circumference:    Other providers:    Additional  information: Forceps:   Vacuum:   Breech:   Observed anomalies       See Dr. Oretha Milch operative note for details of C-section procedure.    Hildred Laser, MD [facility]

## 2022-05-17 ENCOUNTER — Other Ambulatory Visit: Payer: Self-pay

## 2022-05-17 ENCOUNTER — Ambulatory Visit (HOSPITAL_BASED_OUTPATIENT_CLINIC_OR_DEPARTMENT_OTHER): Payer: Medicaid Other

## 2022-05-17 ENCOUNTER — Other Ambulatory Visit
Admission: RE | Admit: 2022-05-17 | Discharge: 2022-05-17 | Disposition: A | Discharge status: Home / Self Care | Payer: Medicaid Other | Source: Ambulatory Visit | Attending: Obstetrics | Admitting: Obstetrics

## 2022-05-17 ENCOUNTER — Ambulatory Visit (HOSPITAL_BASED_OUTPATIENT_CLINIC_OR_DEPARTMENT_OTHER): Payer: Medicaid Other | Admitting: Obstetrics

## 2022-05-17 DIAGNOSIS — O99212 Obesity complicating pregnancy, second trimester: Secondary | ICD-10-CM

## 2022-05-17 DIAGNOSIS — O0990 Supervision of high risk pregnancy, unspecified, unspecified trimester: Secondary | ICD-10-CM | POA: Diagnosis present

## 2022-05-17 DIAGNOSIS — Z6841 Body Mass Index (BMI) 40.0 and over, adult: Secondary | ICD-10-CM

## 2022-05-17 DIAGNOSIS — I10 Essential (primary) hypertension: Secondary | ICD-10-CM | POA: Diagnosis not present

## 2022-05-17 DIAGNOSIS — E669 Obesity, unspecified: Secondary | ICD-10-CM | POA: Diagnosis not present

## 2022-05-17 DIAGNOSIS — Z3A19 19 weeks gestation of pregnancy: Secondary | ICD-10-CM | POA: Diagnosis not present

## 2022-05-17 DIAGNOSIS — Z363 Encounter for antenatal screening for malformations: Secondary | ICD-10-CM | POA: Diagnosis not present

## 2022-05-17 DIAGNOSIS — E282 Polycystic ovarian syndrome: Secondary | ICD-10-CM

## 2022-05-17 DIAGNOSIS — R7303 Prediabetes: Secondary | ICD-10-CM | POA: Diagnosis not present

## 2022-05-17 NOTE — Progress Notes (Signed)
MFM Note  Katherine Huynh was seen for a detailed fetal anatomy scan due to maternal obesity with a BMI of 55.  She denies any significant past medical history and denies any problems in her current pregnancy.    She has declined all screening tests for fetal aneuploidy in her current pregnancy.  She was informed that the fetal growth and amniotic fluid level were appropriate for her gestational age.   There were no obvious fetal anomalies noted on today's ultrasound exam.  However, today's exam was limited due to the fetal position and maternal body habitus.  The patient was informed that anomalies may be missed due to technical limitations. If the fetus is in a suboptimal position or maternal habitus is increased, visualization of the fetus in the maternal uterus may be impaired.  The patient decided to have the cell free DNA test (Materni T21) drawn today following today's ultrasound exam.  Our genetic counselor will notify her regarding the results of this test.  The following were discussed during today's consultation:  Obesity in pregnancy   The increased risk of preeclampsia and gestational diabetes due to maternal obesity was discussed today.    She was advised to continue taking a daily baby aspirin for preeclampsia prophylaxis for the duration of her pregnancy.  The recommended total weight gain in pregnancy for obese women is between 10 to 20 pounds.  She should be screened for gestational diabetes at between 19 to 28 weeks.  We will continue to follow her with growth ultrasounds throughout her pregnancy.   Due to the increased risk of stillbirth in obese women with a BMI of greater than 40, we will start weekly fetal testing at 34 weeks.  As maternal obesity may present challenges associated with the management of anesthesia, an anesthesia consult should be obtained when she is admitted in labor.  A follow-up exam was scheduled in 4 weeks to assess the fetal growth and to  complete the views of the fetal anatomy.    The patient stated that all of her questions were answered today.  A total of 45 minutes was spent counseling and coordinating the care for this patient.  Greater than 50% of the time was spent in direct face-to-face contact.

## 2022-05-21 LAB — MATERNIT21 PLUS CORE+SCA
Fetal Fraction: 6
Monosomy X (Turner Syndrome): NOT DETECTED
Result (T21): NEGATIVE
Trisomy 13 (Patau syndrome): NEGATIVE
Trisomy 18 (Edwards syndrome): NEGATIVE
Trisomy 21 (Down syndrome): NEGATIVE
XXX (Triple X Syndrome): NOT DETECTED
XXY (Klinefelter Syndrome): NOT DETECTED
XYY (Jacobs Syndrome): NOT DETECTED

## 2022-05-22 ENCOUNTER — Telehealth: Payer: Self-pay | Admitting: Obstetrics and Gynecology

## 2022-05-22 NOTE — Telephone Encounter (Signed)
PC to patient, left VM to call back for Mat21 results  Wilburt Finlay, MS, CGC

## 2022-05-22 NOTE — Telephone Encounter (Signed)
The patient was informed of the results of her recent MaterniT21 testing which yielded NEGATIVE results.  The patient's specimen showed DNA consistent with two copies of chromosomes 21, 18 and 13.  The sensitivity for trisomy 39, trisomy 28 and trisomy 47 using this testing are reported as 99.1%, 99.9% and 91.7% respectively.  Thus, while the results of this testing are highly accurate, they are not considered diagnostic at this time.  Should more definitive information be desired, the patient may still consider amniocentesis.   As requested to know by the patient, sex chromosome analysis was included for this sample.  Results are consistent with a female fetus. This is predicted with >99% accuracy. This testing also screens for sex chromosome conditions with greater than 96% accuracy and was negative for those conditions.   A maternal serum AFP only should be considered if screening for neural tube defects is desired.  We may be reached at [phone] with any questions or concerns.  Wilburt Finlay, MS, CGC

## 2022-06-07 ENCOUNTER — Encounter: Payer: Self-pay | Admitting: Obstetrics

## 2022-06-08 ENCOUNTER — Encounter: Payer: Managed Care, Other (non HMO) | Admitting: Obstetrics

## 2022-06-13 ENCOUNTER — Other Ambulatory Visit: Payer: Self-pay

## 2022-06-13 DIAGNOSIS — O10912 Unspecified pre-existing hypertension complicating pregnancy, second trimester: Secondary | ICD-10-CM

## 2022-06-13 DIAGNOSIS — O99212 Obesity complicating pregnancy, second trimester: Secondary | ICD-10-CM

## 2022-06-13 DIAGNOSIS — O0990 Supervision of high risk pregnancy, unspecified, unspecified trimester: Secondary | ICD-10-CM

## 2022-06-14 ENCOUNTER — Other Ambulatory Visit: Payer: Self-pay

## 2022-06-14 ENCOUNTER — Ambulatory Visit: Payer: Medicaid Other | Attending: Maternal & Fetal Medicine

## 2022-06-14 DIAGNOSIS — Z3A23 23 weeks gestation of pregnancy: Secondary | ICD-10-CM

## 2022-06-14 DIAGNOSIS — O99212 Obesity complicating pregnancy, second trimester: Secondary | ICD-10-CM | POA: Insufficient documentation

## 2022-06-14 DIAGNOSIS — O10912 Unspecified pre-existing hypertension complicating pregnancy, second trimester: Secondary | ICD-10-CM

## 2022-06-14 DIAGNOSIS — Z362 Encounter for other antenatal screening follow-up: Secondary | ICD-10-CM

## 2022-06-14 DIAGNOSIS — E669 Obesity, unspecified: Secondary | ICD-10-CM | POA: Diagnosis not present

## 2022-06-14 DIAGNOSIS — Z87891 Personal history of nicotine dependence: Secondary | ICD-10-CM

## 2022-06-14 DIAGNOSIS — O10012 Pre-existing essential hypertension complicating pregnancy, second trimester: Secondary | ICD-10-CM | POA: Diagnosis not present

## 2022-06-14 DIAGNOSIS — O0990 Supervision of high risk pregnancy, unspecified, unspecified trimester: Secondary | ICD-10-CM

## 2022-06-14 DIAGNOSIS — O99891 Other specified diseases and conditions complicating pregnancy: Secondary | ICD-10-CM

## 2022-06-14 DIAGNOSIS — O0992 Supervision of high risk pregnancy, unspecified, second trimester: Secondary | ICD-10-CM | POA: Insufficient documentation

## 2022-06-24 ENCOUNTER — Other Ambulatory Visit: Payer: Self-pay | Admitting: Licensed Practical Nurse

## 2022-06-24 DIAGNOSIS — Z6841 Body Mass Index (BMI) 40.0 and over, adult: Secondary | ICD-10-CM

## 2022-06-24 DIAGNOSIS — O0990 Supervision of high risk pregnancy, unspecified, unspecified trimester: Secondary | ICD-10-CM

## 2022-06-24 NOTE — Progress Notes (Signed)
Pt with high BMI, anesthesia consult ordered Katherine Huynh, [facility]  06/24/22  2:30 PM

## 2022-07-10 ENCOUNTER — Other Ambulatory Visit: Payer: Self-pay

## 2022-07-10 DIAGNOSIS — O0990 Supervision of high risk pregnancy, unspecified, unspecified trimester: Secondary | ICD-10-CM

## 2022-07-10 DIAGNOSIS — E282 Polycystic ovarian syndrome: Secondary | ICD-10-CM

## 2022-07-10 DIAGNOSIS — O10912 Unspecified pre-existing hypertension complicating pregnancy, second trimester: Secondary | ICD-10-CM

## 2022-07-10 DIAGNOSIS — O99212 Obesity complicating pregnancy, second trimester: Secondary | ICD-10-CM

## 2022-07-12 ENCOUNTER — Other Ambulatory Visit: Payer: Self-pay

## 2022-07-12 ENCOUNTER — Ambulatory Visit: Payer: Medicaid Other | Attending: Obstetrics

## 2022-07-12 DIAGNOSIS — O10912 Unspecified pre-existing hypertension complicating pregnancy, second trimester: Secondary | ICD-10-CM

## 2022-07-12 DIAGNOSIS — O10012 Pre-existing essential hypertension complicating pregnancy, second trimester: Secondary | ICD-10-CM

## 2022-07-12 DIAGNOSIS — Z3A27 27 weeks gestation of pregnancy: Secondary | ICD-10-CM

## 2022-07-12 DIAGNOSIS — O0990 Supervision of high risk pregnancy, unspecified, unspecified trimester: Secondary | ICD-10-CM

## 2022-07-12 DIAGNOSIS — O99212 Obesity complicating pregnancy, second trimester: Secondary | ICD-10-CM

## 2022-07-12 DIAGNOSIS — E282 Polycystic ovarian syndrome: Secondary | ICD-10-CM

## 2022-07-15 ENCOUNTER — Other Ambulatory Visit: Payer: Self-pay

## 2022-07-15 ENCOUNTER — Observation Stay
Admission: EM | Admit: 2022-07-15 | Discharge: 2022-07-16 | Disposition: A | Discharge status: Home / Self Care | Payer: Medicaid Other | Attending: Licensed Practical Nurse | Admitting: Licensed Practical Nurse

## 2022-07-15 DIAGNOSIS — Z7982 Long term (current) use of aspirin: Secondary | ICD-10-CM | POA: Insufficient documentation

## 2022-07-15 DIAGNOSIS — Z87891 Personal history of nicotine dependence: Secondary | ICD-10-CM | POA: Insufficient documentation

## 2022-07-15 DIAGNOSIS — O23592 Infection of other part of genital tract in pregnancy, second trimester: Principal | ICD-10-CM | POA: Insufficient documentation

## 2022-07-15 DIAGNOSIS — B9689 Other specified bacterial agents as the cause of diseases classified elsewhere: Secondary | ICD-10-CM | POA: Diagnosis not present

## 2022-07-15 DIAGNOSIS — Z3A27 27 weeks gestation of pregnancy: Secondary | ICD-10-CM | POA: Diagnosis not present

## 2022-07-15 DIAGNOSIS — O26899 Other specified pregnancy related conditions, unspecified trimester: Secondary | ICD-10-CM | POA: Diagnosis present

## 2022-07-15 DIAGNOSIS — O26892 Other specified pregnancy related conditions, second trimester: Secondary | ICD-10-CM | POA: Diagnosis present

## 2022-07-15 LAB — URINALYSIS, COMPLETE (UACMP) WITH MICROSCOPIC
Bacteria, UA: NONE SEEN
Bilirubin Urine: NEGATIVE
Glucose, UA: NEGATIVE mg/dL
Hgb urine dipstick: NEGATIVE
Ketones, ur: NEGATIVE mg/dL
Nitrite: NEGATIVE
Protein, ur: NEGATIVE mg/dL
Specific Gravity, Urine: 1.016 (ref 1.005–1.030)
pH: 6 (ref 5.0–8.0)

## 2022-07-15 LAB — WET PREP, GENITAL
Sperm: NONE SEEN
Trich, Wet Prep: NONE SEEN
WBC, Wet Prep HPF POC: >=10 — AB (ref <10)
Yeast Wet Prep HPF POC: NONE SEEN

## 2022-07-15 MED ORDER — METRONIDAZOLE 500 MG PO TABS: 500.0000 mg | ORAL_TABLET | Freq: Two times a day (BID) | ORAL | 0 refills | Status: DC

## 2022-07-15 NOTE — Discharge Summary (Signed)
Physician Final Progress Note  Patient ID: Katherine Huynh MRN: [id number] DOB/AGE: [birth year] 27 y.o.  Admit date: 07/15/2022 Admitting provider: Harlin Heys, MD Discharge date: 07/15/2022   Admission Diagnoses:  1) intrauterine pregnancy at [redacted]w[redacted]d 2) cramping  Discharge Diagnoses:  Principal Problem:   Cramping affecting pregnancy, antepartum  Bacterial vaginosis   History of Present Illness:  Here with her mother.  The patient is a 27y.o. female G3P0020 at 259w3dho presents for cramping. The cramping started around 830 PM, it is in the lower abdomen, it feels like period like cramps. Denies low back pain or urinary urgency or frequency. Last had a BM this evening, pt states it was "normal", she has not had any issues with constipation. She last had IC weeks ago. Reports having an increase in vaginal discharge. Denies contractions or LOF/VB. She has not done much today but relax as she feels a cold has started she has had a cough, denies fevers or SOB. She has had 2 16 oz bottles of water and  some orange juice to drink today,.   Past Medical History:  Diagnosis Date   Anemia    Anxiety    Bilateral swelling of feet    Constipation    Depression    Fam hx-ischem heart disease 09/15/2015   Heart murmur    Infertility, female    [place]   Multiple food allergies    Other fatigue    Polycystic disease, ovaries    Prediabetes    Shortness of breath on exertion    Vitamin D deficiency     Past Surgical History:  Procedure Laterality Date   DG HYSTEROGRAM (HSD)  01/13/2020       HYSTEROSCOPY WITH D & C N/A 11/07/2020   Procedure: DILATATION AND CURETTAGE /HYSTEROSCOPY;  Surgeon: ChRubie MaidMD;  Location: [facility] ORS;  Service: Gynecology;  Laterality: N/A;   tooth implant Right 02/11/2022    No current facility-administered medications on file prior to encounter.   Current Outpatient Medications on File Prior to Encounter  Medication Sig  Dispense Refill   aspirin EC 81 MG tablet Take 1 tablet (81 mg total) by mouth daily. Take after 12 weeks for prevention of preeclampssia later in pregnancy 300 tablet 2   diphenhydrAMINE (BENADRYL) 25 mg capsule Take 1 capsule by mouth as needed.     prenatal vitamin w/FE, FA (PRENATAL 1 + 1) 27-1 MG TABS tablet Take 1 tablet by mouth daily at 12 noon.     fluticasone (FLOVENT HFA) 220 MCG/ACT inhaler Inhale 1 puff into the lungs as needed.     ondansetron (ZOFRAN-ODT) 4 MG disintegrating tablet Take 1 tablet (4 mg total) by mouth every 8 (eight) hours as needed for nausea or vomiting. 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  Tobacco Use   Smoking status: Former    Types: Cigarettes    Quit date: 09/28/2016    Years since quitting: 5.7   Smokeless tobacco: Never  Vaping Use  Vaping Use: Former   Quit date: 10/14/2021   Substances: Nicotine, Flavoring  Substance and Sexual Activity   Alcohol use: Not Currently    Alcohol/week: 6.0 standard drinks of alcohol    Types: 3 Glasses of wine, 2 Cans of beer, 1 Shots of liquor per week    Comment: occas   Drug use: Not Currently    Types: Marijuana    Comment: last used about a month ago   Sexual activity: Yes    Partners: Male    Birth control/protection: None  Other Topics Concern   Not on file  Social History Narrative   Lives with Pine, they live together, works full time.    Social Determinants of Health   Financial Resource Strain: Low Risk  (02/13/2022)   Overall Financial Resource Strain (CARDIA)     Difficulty of Paying Living Expenses: Not very hard  Food Insecurity: No Food Insecurity (02/13/2022)   Hunger Vital Sign    Worried About Running Out of Food in the Last Year: Never true    Ran Out of Food in the Last Year: Never true  Transportation Needs: No Transportation Needs (02/13/2022)   PRAPARE - Hydrologist (Medical): No    Lack of Transportation (Non-Medical): No  Physical Activity: Insufficiently Active (02/13/2022)   Exercise Vital Sign    Days of Exercise per Week: 2 days    Minutes of Exercise per Session: 20 min  Stress: No Stress Concern Present (02/13/2022)   [place]    Feeling of Stress : Not at all  Social Connections: Moderately Isolated (02/13/2022)   Social Connection and Isolation Panel [NHANES]    Frequency of Communication with Friends and Family: More than three times a week    Frequency of Social Gatherings with Friends and Family: Once a week    Attends Religious Services: Never    Marine scientist or Organizations: No    Attends Archivist Meetings: Never    Marital Status: Living with partner  Intimate Partner Violence: Not At Risk (02/13/2022)   Humiliation, Afraid, Rape, and Kick questionnaire    Fear of Current or Ex-Partner: No    Emotionally Abused: No    Physically Abused: No    Sexually Abused: No    Family History  Problem Relation Age of Onset   Obesity Mother    Polycystic ovary syndrome Mother    Hypertension Father    Hyperlipidemia Father    Sleep apnea Father    Obesity Father    Alcohol abuse Sister    ADD / ADHD Sister    Cancer Maternal Grandmother        breast   Fibroids Maternal Grandmother    Uterine cancer Maternal Grandmother 75   Heart disease Maternal Grandfather    Heart disease Paternal Grandfather      Review of Systems  Constitutional:  Negative for chills and fever.  HENT:  Positive for  congestion.   Respiratory:  Positive for cough. Negative for shortness of breath.   Cardiovascular: Negative.   Gastrointestinal:  Positive for abdominal pain. Negative for nausea and vomiting.  Genitourinary:  Negative for dysuria, flank pain, frequency and urgency.  Musculoskeletal:  Negative for back pain.     Physical Exam: BP 127/72 (BP Location: Right Arm)   Pulse (!) 114   Temp 98.1 F (36.7 C) (Oral)   Resp 19   Ht '5\' 3"'$  (  1.6 m)   LMP 12/19/2021 (Exact Date)   BMI 55.80 kg/m   Physical Exam Constitutional:      Appearance: Normal appearance.  Genitourinary:     Vulva normal.     Genitourinary Comments: SSE: cervix difficult to fully visual, appears closed. Moderate amount of creamy white discharge present.   Pulmonary:     Effort: Pulmonary effort is normal.  Abdominal:     Tenderness: There is no abdominal tenderness.     Comments: gravid  Musculoskeletal:     Right lower leg: No edema.     Left lower leg: No edema.  Skin:    General: Skin is warm.  Psychiatric:        Mood and Affect: Mood normal.    EFM: baseline 155, moderate variability, pos accel (10x10), neg decel TOCO: no contractions   Consults: None  Significant Findings/ Diagnostic Studies: wet prep shows WBC's and clue cells, gc/ct pending   Procedures: RNST  Hospital Course: The patient was admitted to Labor and Delivery Triage for observation. She had a RNST. Wet prep shows clue cells, will treat for BV, pt familiar with diagnosis.  Pt with BMI 56. Reports she had her anesthesia consult in December, will need repeat consult at 32 weeks.   Discharge Condition: good  Disposition: Discharge disposition: 01-Home or Self Care       Diet: Regular diet  Discharge Activity: Activity as tolerated   Allergies as of 07/15/2022       Reactions   Cinnamon Itching   Bonjesta [doxylamine-pyridoxine] Hives, Itching   Erythromycin Rash   Sulfa Antibiotics Rash        Medication List      TAKE these medications    aspirin EC 81 MG tablet Take 1 tablet (81 mg total) by mouth daily. Take after 12 weeks for prevention of preeclampssia later in pregnancy   diphenhydrAMINE 25 mg capsule Commonly known as: BENADRYL Take 1 capsule by mouth as needed.   fluticasone 220 MCG/ACT inhaler Commonly known as: FLOVENT HFA Inhale 1 puff into the lungs as needed.   metroNIDAZOLE 500 MG tablet Commonly known as: FLAGYL Take 1 tablet (500 mg total) by mouth 2 (two) times daily.   ondansetron 4 MG disintegrating tablet Commonly known as: ZOFRAN-ODT Take 1 tablet (4 mg total) by mouth every 8 (eight) hours as needed for nausea or vomiting.   prenatal vitamin w/FE, FA 27-1 MG Tabs tablet Take 1 tablet by mouth daily at 12 noon.         Total time spent taking care of this patient: 20 minutes  Signed: [place], CNM  07/15/2022, 11:36 PM

## 2022-07-16 LAB — CHLAMYDIA/NGC RT PCR (ARMC ONLY)
Chlamydia Tr: NOT DETECTED
N gonorrhoeae: NOT DETECTED

## 2022-07-17 ENCOUNTER — Encounter: Payer: Medicaid Other | Admitting: Obstetrics

## 2022-07-17 LAB — URINE CULTURE

## 2022-07-18 ENCOUNTER — Ambulatory Visit (INDEPENDENT_AMBULATORY_CARE_PROVIDER_SITE_OTHER): Payer: Medicaid Other | Admitting: Obstetrics & Gynecology

## 2022-07-18 VITALS — BP 114/77 | HR 101 | Wt 314.0 lb

## 2022-07-18 DIAGNOSIS — O99210 Obesity complicating pregnancy, unspecified trimester: Secondary | ICD-10-CM | POA: Insufficient documentation

## 2022-07-18 DIAGNOSIS — O0990 Supervision of high risk pregnancy, unspecified, unspecified trimester: Secondary | ICD-10-CM

## 2022-07-18 DIAGNOSIS — Z3A27 27 weeks gestation of pregnancy: Secondary | ICD-10-CM

## 2022-07-18 DIAGNOSIS — O99212 Obesity complicating pregnancy, second trimester: Secondary | ICD-10-CM

## 2022-07-18 DIAGNOSIS — E669 Obesity, unspecified: Secondary | ICD-10-CM

## 2022-07-18 LAB — POCT URINALYSIS DIPSTICK OB
Bilirubin, UA: NEGATIVE
Blood, UA: NEGATIVE
Glucose, UA: NEGATIVE
Ketones, UA: NEGATIVE
Nitrite, UA: NEGATIVE
POC,PROTEIN,UA: NEGATIVE
Spec Grav, UA: 1.01 (ref 1.010–1.025)
Urobilinogen, UA: 1 E.U./dL
pH, UA: 7 (ref 5.0–8.0)

## 2022-07-18 NOTE — Progress Notes (Signed)
   PRENATAL VISIT NOTE  Subjective:  Katherine Huynh is a 27 y.o. G3P0020 at 23w6dbeing seen today for ongoing prenatal care.  She is currently monitored for the following issues for this high-risk pregnancy and has Personal history of sulfonamide allergy; Onychomycosis; Fam hx-ischem heart disease; Athlete's foot, left; Depression, major, recurrent ([place] [employer]; Morbid obesity ([place]; Difficulty controlling anger; Polycystic disease, ovaries; Murmur, cardiac; Severe dysmenorrhea; Moderate episode of recurrent major depressive disorder ([place]; Diabetes mellitus ([place]; Primary hypertension; Pure hypercholesterolemia; Borderline personality disorder in adult ([facility]; Abnormal uterine bleeding; Supervision of high risk pregnancy, antepartum; Heartburn in pregnancy in second trimester; Nausea and vomiting in pregnancy; and Cramping affecting pregnancy, antepartum on their problem list.  Patient reports no complaints.  Contractions: Not present. Vag. Bleeding: None.  Movement: Present. Denies leaking of fluid.   The following portions of the patient's history were reviewed and updated as appropriate: allergies, current medications, past family history, past medical history, past social history, past surgical history and problem list.   Objective:   Vitals:   07/18/22 1544  BP: 114/77  Pulse: (!) 101  Weight: (!) 314 lb (142.4 kg)    Fetal Status:     Movement: Present     General:  Alert, oriented and cooperative. Patient is in no acute distress.  Skin: Skin is warm and dry. No rash noted.   Cardiovascular: Normal heart rate noted  Respiratory: Normal respiratory effort, no problems with respiration noted  Abdomen: Soft, gravid, appropriate for gestational age.  Pain/Pressure: Present     Pelvic: Cervical exam deferred        Extremities: Normal range of motion.     Mental Status: Normal mood and affect. Normal behavior. Normal judgment and thought content.   Assessment and Plan:  Pregnancy: [id number]  228w6d. Supervision of high risk pregnancy, antepartum  - POC Urinalysis Dipstick OB  2. Obesity in pregnancy -  - taking baby asa daily - no weight gain but growth ultrasounds are reassuring, 61% on recent scan   Preterm labor symptoms and general obstetric precautions including but not limited to vaginal bleeding, contractions, leaking of fluid and fetal movement were reviewed in detail with the patient. Please refer to After Visit Summary for other counseling recommendations.   Return in about 1 week (around 07/25/2022).  Future Appointments  Date Time Provider [place]3/27/2024  3:00 PM [facility]  08/29/2022  2:00 PM [facility]  09/05/2022  2:00 PM [facility]    MyEmily FilbertMD

## 2022-07-19 ENCOUNTER — Encounter: Payer: Self-pay | Admitting: Obstetrics & Gynecology

## 2022-07-25 ENCOUNTER — Other Ambulatory Visit: Payer: Medicaid Other

## 2022-07-26 ENCOUNTER — Other Ambulatory Visit: Payer: Self-pay

## 2022-07-26 ENCOUNTER — Other Ambulatory Visit: Payer: Medicaid Other

## 2022-07-26 DIAGNOSIS — O0990 Supervision of high risk pregnancy, unspecified, unspecified trimester: Secondary | ICD-10-CM

## 2022-07-26 DIAGNOSIS — Z3A27 27 weeks gestation of pregnancy: Secondary | ICD-10-CM

## 2022-07-27 LAB — GLUCOSE TOLERANCE, 1 HOUR: Glucose, 1Hr PP: 118 mg/dL (ref 70–199)

## 2022-07-29 ENCOUNTER — Observation Stay
Admission: EM | Admit: 2022-07-29 | Discharge: 2022-07-29 | Disposition: A | Discharge status: Home / Self Care | Payer: Medicaid Other | Attending: Obstetrics and Gynecology | Admitting: Obstetrics and Gynecology

## 2022-07-29 DIAGNOSIS — Z7982 Long term (current) use of aspirin: Secondary | ICD-10-CM | POA: Insufficient documentation

## 2022-07-29 DIAGNOSIS — Z3A29 29 weeks gestation of pregnancy: Secondary | ICD-10-CM

## 2022-07-29 DIAGNOSIS — O368190 Decreased fetal movements, unspecified trimester, not applicable or unspecified: Secondary | ICD-10-CM | POA: Diagnosis present

## 2022-07-29 DIAGNOSIS — Z79899 Other long term (current) drug therapy: Secondary | ICD-10-CM | POA: Insufficient documentation

## 2022-07-29 DIAGNOSIS — Z87891 Personal history of nicotine dependence: Secondary | ICD-10-CM | POA: Diagnosis not present

## 2022-07-29 DIAGNOSIS — O368130 Decreased fetal movements, third trimester, not applicable or unspecified: Principal | ICD-10-CM

## 2022-07-29 NOTE — OB Triage Note (Signed)
Pt is G3P0 at 29.3 weeks with c/o not feeling baby moving  since 5a. On monitor fetal movement heard with reative NST in progress. Pt states she doesn't fetal movement even when movement is dected and audible on fetal monitors

## 2022-07-29 NOTE — Final Progress Note (Signed)
OB/Triage Note  Patient ID: Katherine Huynh MRN: [id number] DOB/AGE: 08-03-1995 27 y.o.  Subjective  History of Present Illness: The patient is a 27 y.o. female G3P0020 at [redacted]w[redacted]d who presents for decreased fetal movement. States she has not felt her baby move since this morning. Denies contractions, VB, LOF, visual changes.    Past Medical History:  Diagnosis Date   Anemia    Anxiety    Bilateral swelling of feet    Constipation    Depression    Fam hx-ischem heart disease 09/15/2015   Heart murmur    Infertility, female    Morbid obesity ([place]) 02/17/2016   Multiple food allergies    Other fatigue    Polycystic disease, ovaries    Prediabetes    Shortness of breath on exertion    Vitamin D deficiency     Past Surgical History:  Procedure Laterality Date   DG HYSTEROGRAM (HSD)  01/13/2020       HYSTEROSCOPY WITH D & C N/A 11/07/2020   Procedure: DILATATION AND CURETTAGE /HYSTEROSCOPY;  Surgeon: Rubie Maid, MD;  Location: [facility];  Service: Gynecology;  Laterality: N/A;   tooth implant Right 02/11/2022    No current facility-administered medications on file prior to encounter.   Current Outpatient Medications on File Prior to Encounter  Medication Sig Dispense Refill   aspirin EC 81 MG tablet Take 1 tablet (81 mg total) by mouth daily. Take after 12 weeks for prevention of preeclampssia later in pregnancy 300 tablet 2   diphenhydrAMINE (BENADRYL) 25 mg capsule Take 1 capsule by mouth as needed.     fluticasone (FLOVENT HFA) 220 MCG/ACT inhaler Inhale 1 puff into the lungs as needed.     metroNIDAZOLE (FLAGYL) 500 MG tablet Take 1 tablet (500 mg total) by mouth 2 (two) times daily. 14 tablet 0   ondansetron (ZOFRAN-ODT) 4 MG disintegrating tablet Take 1 tablet (4 mg total) by mouth every 8 (eight) hours as needed for nausea or vomiting. (Patient not taking: Reported on 05/17/2022) 20 tablet 2   prenatal vitamin w/FE, FA (PRENATAL 1 + 1) 27-1 MG TABS tablet Take 1 tablet by  mouth daily at 12 noon.      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  Tobacco Use   Smoking status: Former    Types: Cigarettes    Quit date: 09/28/2016    Years since quitting: 5.8   Smokeless tobacco: Never  Vaping Use   Vaping Use: Former   Quit date: 10/14/2021   Substances: Nicotine, Flavoring  Substance and Sexual Activity   Alcohol use: Not Currently    Alcohol/week: 6.0 standard drinks of alcohol    Types: 3 Glasses of wine, 2 Cans of beer, 1 Shots of liquor per week    Comment: occas   Drug use: Not Currently    Types: Marijuana    Comment: last used about a month ago   Sexual activity: Yes    Partners: Male    Birth control/protection: None  Other Topics Concern   Not on file  Social History Narrative   Lives  with fiancee, they live together, works full time.    Social Determinants of Health   Financial Resource Strain: Low Risk  (02/13/2022)   Overall Financial Resource Strain (CARDIA)    Difficulty of Paying Living Expenses: Not very hard  Food Insecurity: No Food Insecurity (02/13/2022)   Hunger Vital Sign    Worried About Running Out of Food in the Last Year: Never true    Ran Out of Food in the Last Year: Never true  Transportation Needs: No Transportation Needs (02/13/2022)   PRAPARE - Hydrologist (Medical): No    Lack of Transportation (Non-Medical): No  Physical Activity: Insufficiently Active (02/13/2022)   Exercise Vital Sign    Days of Exercise per Week: 2 days    Minutes of Exercise per Session: 20 min  Stress: No Stress Concern Present (02/13/2022)    [place]    Feeling of Stress : Not at all  Social Connections: Moderately Isolated (02/13/2022)   Social Connection and Isolation Panel [NHANES]    Frequency of Communication with Friends and Family: More than three times a week    Frequency of Social Gatherings with Friends and Family: Once a week    Attends Religious Services: Never    Marine scientist or Organizations: No    Attends Archivist Meetings: Never    Marital Status: Living with partner  Intimate Partner Violence: Not At Risk (02/13/2022)   Humiliation, Afraid, Rape, and Kick questionnaire    Fear of Current or Ex-Partner: No    Emotionally Abused: No    Physically Abused: No    Sexually Abused: No    Family History  Problem Relation Age of Onset   Obesity Mother    Polycystic ovary syndrome Mother    Hypertension Father    Hyperlipidemia Father    Sleep apnea Father    Obesity Father    Alcohol abuse Sister    ADD / ADHD Sister    Cancer Maternal Grandmother        breast   Fibroids Maternal Grandmother    Uterine cancer Maternal Grandmother 75   Heart disease Maternal Grandfather    Heart disease Paternal Grandfather      ROS    Objective  Physical Exam: BP 120/74   Pulse (!) 102   Temp 98.9 F (37.2 C) (Oral)   Resp 16   LMP 12/19/2021 (Exact Date)   OBGyn Exam  FHT 150, mod variability, pos accels, no decels Toco no contractions On the monitor was able to audibly hear the baby moving numerous times    Hospital Course: The patient was admitted to [facility] Triage for observation. While patient still did not feel her baby move it was evident on the monitor, also had a reactive NST. She felt reassured. Has an ROB in two days, will f/u then.  Assessment: 27 y.o. female G3P0020 at [redacted]w[redacted]d  RNST Audible fetal movement on monitor  Plan: Discharge home Follow up Tuesday at [place]: discussed also placing hands on  abdomen or watch her belly for movement when she can't feel it otherwise  Discharge Instructions     Discharge activity:  No Restrictions   Complete by: As directed    Discharge diet:  No restrictions   Complete by: As directed    Fetal Kick Count:  Lie on our left side for one hour after a meal, and count the number of times  your baby kicks.  If it is less than 5 times, get up, move around and drink some juice.  Repeat the test 30 minutes later.  If it is still less than 5 kicks in an hour, notify your doctor.   Complete by: As directed    No sexual activity restrictions   Complete by: As directed       Allergies as of 07/29/2022       Reactions   Cinnamon Itching   Bonjesta [doxylamine-pyridoxine] Hives, Itching   Erythromycin Rash   Sulfa Antibiotics Rash        Medication List     TAKE these medications    aspirin EC 81 MG tablet Take 1 tablet (81 mg total) by mouth daily. Take after 12 weeks for prevention of preeclampssia later in pregnancy   diphenhydrAMINE 25 mg capsule Commonly known as: BENADRYL Take 1 capsule by mouth as needed.   fluticasone 220 MCG/ACT inhaler Commonly known as: FLOVENT HFA Inhale 1 puff into the lungs as needed.   metroNIDAZOLE 500 MG tablet Commonly known as: FLAGYL Take 1 tablet (500 mg total) by mouth 2 (two) times daily.   ondansetron 4 MG disintegrating tablet Commonly known as: ZOFRAN-ODT Take 1 tablet (4 mg total) by mouth every 8 (eight) hours as needed for nausea or vomiting.   prenatal vitamin w/FE, FA 27-1 MG Tabs tablet Take 1 tablet by mouth daily at 12 noon.         Total time spent taking care of this patient: 45 minutes  Signed: Maggie Font CNM, FNP 07/29/2022, 8:43 PM

## 2022-07-29 NOTE — Progress Notes (Signed)
Pt feels very reassured

## 2022-08-01 ENCOUNTER — Other Ambulatory Visit: Payer: Self-pay

## 2022-08-01 MED ORDER — DOXYLAMINE-PYRIDOXINE 10-10 MG PO TBEC: 1.0000 | DELAYED_RELEASE_TABLET | Freq: Every day | ORAL | 1 refills | Status: DC

## 2022-08-02 ENCOUNTER — Ambulatory Visit (INDEPENDENT_AMBULATORY_CARE_PROVIDER_SITE_OTHER): Payer: Medicaid Other | Admitting: Obstetrics

## 2022-08-02 VITALS — BP 109/74 | HR 98 | Wt 321.0 lb

## 2022-08-02 DIAGNOSIS — O0990 Supervision of high risk pregnancy, unspecified, unspecified trimester: Secondary | ICD-10-CM

## 2022-08-02 DIAGNOSIS — Z3A30 30 weeks gestation of pregnancy: Secondary | ICD-10-CM

## 2022-08-02 DIAGNOSIS — Z23 Encounter for immunization: Secondary | ICD-10-CM | POA: Diagnosis not present

## 2022-08-02 DIAGNOSIS — O26893 Other specified pregnancy related conditions, third trimester: Secondary | ICD-10-CM

## 2022-08-02 DIAGNOSIS — Z113 Encounter for screening for infections with a predominantly sexual mode of transmission: Secondary | ICD-10-CM

## 2022-08-02 DIAGNOSIS — M25559 Pain in unspecified hip: Secondary | ICD-10-CM

## 2022-08-02 LAB — POCT URINALYSIS DIPSTICK OB
Bilirubin, UA: NEGATIVE
Blood, UA: NEGATIVE
Glucose, UA: NEGATIVE
Ketones, UA: NEGATIVE
Leukocytes, UA: NEGATIVE
Nitrite, UA: NEGATIVE
POC,PROTEIN,UA: NEGATIVE
Spec Grav, UA: 1.02 (ref 1.010–1.025)
Urobilinogen, UA: 0.2 E.U./dL
pH, UA: 5 (ref 5.0–8.0)

## 2022-08-02 NOTE — Addendum Note (Signed)
Addended by: Landis Gandy on: 08/02/2022 11:04 AM   Modules accepted: Orders

## 2022-08-02 NOTE — Progress Notes (Signed)
ROB at [redacted]w[redacted]d. Active baby. Katherine Huynh was recently seen in triage for decreased fetal movement. She has had good movement since. Denies ctx, LOF, and vaginal bleeding. She reports some hip/low back pain. Discussed stretches and comfort measures. Referral to chiropractor placed. Anesthesia consult for 32 weeks. F/u with MFM already scheduled. Weekly testing starting at 34 weeks. CBC and RPR collected today. RTC in 2 weeks.

## 2022-08-03 LAB — CBC
Hematocrit: 32.4 % — ABNORMAL LOW (ref 34.0–46.6)
Hemoglobin: 10.5 g/dL — ABNORMAL LOW (ref 11.1–15.9)
MCH: 27.1 pg (ref 26.6–33.0)
MCHC: 32.4 g/dL (ref 31.5–35.7)
MCV: 84 fL (ref 79–97)
Platelets: 302 10*3/uL (ref 150–450)
RBC: 3.88 x10E6/uL (ref 3.77–5.28)
RDW: 13.1 % (ref 11.7–15.4)
WBC: 7.7 10*3/uL (ref 3.4–10.8)

## 2022-08-03 LAB — RPR: RPR Ser Ql: NONREACTIVE

## 2022-08-04 ENCOUNTER — Encounter: Payer: Self-pay | Admitting: Obstetrics and Gynecology

## 2022-08-04 ENCOUNTER — Observation Stay
Admission: EM | Admit: 2022-08-04 | Discharge: 2022-08-04 | Disposition: A | Discharge status: Home / Self Care | Payer: Medicaid Other | Attending: Certified Nurse Midwife | Admitting: Certified Nurse Midwife

## 2022-08-04 DIAGNOSIS — N898 Other specified noninflammatory disorders of vagina: Secondary | ICD-10-CM | POA: Diagnosis not present

## 2022-08-04 DIAGNOSIS — Z3A30 30 weeks gestation of pregnancy: Secondary | ICD-10-CM | POA: Diagnosis not present

## 2022-08-04 DIAGNOSIS — O23593 Infection of other part of genital tract in pregnancy, third trimester: Secondary | ICD-10-CM | POA: Diagnosis present

## 2022-08-04 DIAGNOSIS — O26893 Other specified pregnancy related conditions, third trimester: Secondary | ICD-10-CM | POA: Diagnosis not present

## 2022-08-04 NOTE — OB Triage Note (Signed)
OB Triage Nursing Note  Katherine Huynh is a 27 y.o. female, G3P0020, at [redacted]w[redacted]d gestation who presents to the [facility] triage with complaints of loss of mucus plug around 8pm today. Patient reports active fetal movement and no symptoms consistent with LOF or vaginal bleeding. Patient notified of visitation policy with partner at side. Pt placed on EFM and Toco to non tender area of abdomen. Pt medical history reviewed and consents for treatment signed. Will notify provider on call of patient arrival.   Alycia Cooperwood L Legacie Dillingham 08/04/2022 10:52 PM

## 2022-08-04 NOTE — OB Triage Note (Signed)
   L&D OB Triage Note  SUBJECTIVE Katherine Huynh is a 27 y.o. G3P0020 female at [redacted]w[redacted]d, EDD Estimated Date of Delivery: 10/11/22 who presented to triage with complaints of mucus discharge x 1 .  She denies loss of fluid , vaginal bleeding, and contractions. She is feeling good movement.   OB History  Gravida Para Term Preterm AB Living  3 0 0 0 2 0  SAB IAB Ectopic Multiple Live Births  2 0 0 0 0    # Outcome Date GA Lbr Len/2nd Weight Sex Delivery Anes PTL Lv  3 Current           2 SAB 2019          1 SAB 2017            Medications Prior to Admission  Medication Sig Dispense Refill Last Dose   aspirin EC 81 MG tablet Take 1 tablet (81 mg total) by mouth daily. Take after 12 weeks for prevention of preeclampssia later in pregnancy (Patient not taking: Reported on 08/02/2022) 300 tablet 2    diphenhydrAMINE (BENADRYL) 25 mg capsule Take 1 capsule by mouth as needed.      Doxylamine-Pyridoxine 10-10 MG TBEC Take 1 tablet by mouth daily. 60 tablet 1    fluticasone (FLOVENT HFA) 220 MCG/ACT inhaler Inhale 1 puff into the lungs as needed.      metroNIDAZOLE (FLAGYL) 500 MG tablet Take 1 tablet (500 mg total) by mouth 2 (two) times daily. (Patient not taking: Reported on 08/02/2022) 14 tablet 0    ondansetron (ZOFRAN-ODT) 4 MG disintegrating tablet Take 1 tablet (4 mg total) by mouth every 8 (eight) hours as needed for nausea or vomiting. 20 tablet 2    prenatal vitamin w/FE, FA (PRENATAL 1 + 1) 27-1 MG TABS tablet Take 1 tablet by mouth daily at 12 noon.        OBJECTIVE  Nursing Evaluation:   LMP 12/19/2021 (Exact Date)    Findings:        Reactive ,normal vaginal mucus.       NST was performed and has been reviewed by me.  NST INTERPRETATION: Category I  Baseline 145 Accelerations present Decelerations absent Moderate variability  Toco: ctx absent      ASSESSMENT Impression:  1.  Pregnancy:  G3P0020 at [redacted]w[redacted]d , EDD Estimated Date of Delivery: 10/11/22 2.  Reassuring fetal  and maternal status 3.  Normal mucus discharge   PLAN 1. Current condition and above findings reviewed.  Reassuring fetal and maternal condition. 2. Discharge home with standard labor precautions given to return to L&D or call the office for problems. 3. Continue routine prenatal care.  Philip Aspen, CNM

## 2022-08-05 DIAGNOSIS — O23593 Infection of other part of genital tract in pregnancy, third trimester: Secondary | ICD-10-CM | POA: Diagnosis not present

## 2022-08-06 ENCOUNTER — Other Ambulatory Visit: Payer: Self-pay

## 2022-08-06 ENCOUNTER — Telehealth: Payer: Self-pay | Admitting: Medical

## 2022-08-06 DIAGNOSIS — O0990 Supervision of high risk pregnancy, unspecified, unspecified trimester: Secondary | ICD-10-CM

## 2022-08-06 DIAGNOSIS — O99210 Obesity complicating pregnancy, unspecified trimester: Secondary | ICD-10-CM

## 2022-08-06 DIAGNOSIS — O99213 Obesity complicating pregnancy, third trimester: Secondary | ICD-10-CM

## 2022-08-06 NOTE — Telephone Encounter (Signed)
Contacted pt to schedule ROB appt.  Pt was offered 08/15/22 with LMD.  Pt was not available on this day.  Called pt on 08/06/2022 to schedule ROB on 08/17/22 with Kerry Hough.  Pt could not do April 5th.  PT states that she needs PM appointment.

## 2022-08-07 ENCOUNTER — Encounter: Payer: Self-pay | Admitting: Obstetrics

## 2022-08-08 ENCOUNTER — Other Ambulatory Visit: Payer: Self-pay

## 2022-08-08 ENCOUNTER — Ambulatory Visit: Payer: Medicaid Other | Attending: Obstetrics and Gynecology

## 2022-08-08 ENCOUNTER — Ambulatory Visit: Payer: Medicaid Other

## 2022-08-08 DIAGNOSIS — O10013 Pre-existing essential hypertension complicating pregnancy, third trimester: Secondary | ICD-10-CM | POA: Diagnosis not present

## 2022-08-08 DIAGNOSIS — O99213 Obesity complicating pregnancy, third trimester: Secondary | ICD-10-CM | POA: Diagnosis not present

## 2022-08-08 DIAGNOSIS — Z3A30 30 weeks gestation of pregnancy: Secondary | ICD-10-CM | POA: Diagnosis not present

## 2022-08-08 DIAGNOSIS — O0990 Supervision of high risk pregnancy, unspecified, unspecified trimester: Secondary | ICD-10-CM

## 2022-08-08 DIAGNOSIS — O0993 Supervision of high risk pregnancy, unspecified, third trimester: Secondary | ICD-10-CM | POA: Insufficient documentation

## 2022-08-09 ENCOUNTER — Ambulatory Visit: Payer: Self-pay

## 2022-08-14 ENCOUNTER — Telehealth: Payer: Self-pay | Admitting: Obstetrics

## 2022-08-14 NOTE — Telephone Encounter (Signed)
I contacted the patient via phone. I left message for the patient to call back to scheduled. It looks like the patient is only available PM only. Current two opening AM on for MMF for 4/3 and 4/4.

## 2022-08-14 NOTE — Telephone Encounter (Signed)
Left message for patient to call office back to make appt for her ROB. Offered her 08/16/22 in the AM with MMF.

## 2022-08-15 NOTE — Telephone Encounter (Signed)
I contacted the patient via phone. I left message advising the patient of opening this Friday, 4/5 with Philip Aspen for PM only appointment per the patient request.

## 2022-08-21 ENCOUNTER — Ambulatory Visit (INDEPENDENT_AMBULATORY_CARE_PROVIDER_SITE_OTHER): Payer: Medicaid Other | Admitting: Licensed Practical Nurse

## 2022-08-21 ENCOUNTER — Encounter: Payer: Self-pay | Admitting: Licensed Practical Nurse

## 2022-08-21 VITALS — BP 109/73 | HR 96 | Wt 321.0 lb

## 2022-08-21 DIAGNOSIS — Z3A32 32 weeks gestation of pregnancy: Secondary | ICD-10-CM

## 2022-08-21 DIAGNOSIS — O0990 Supervision of high risk pregnancy, unspecified, unspecified trimester: Secondary | ICD-10-CM

## 2022-08-21 DIAGNOSIS — O219 Vomiting of pregnancy, unspecified: Secondary | ICD-10-CM

## 2022-08-21 MED ORDER — ONDANSETRON 4 MG PO TBDP: 4.0000 mg | ORAL_TABLET | Freq: Three times a day (TID) | ORAL | 2 refills | Status: DC | PRN

## 2022-08-21 NOTE — Progress Notes (Signed)
Routine Prenatal Care Visit  Subjective  Katherine Huynh is a 27 y.o. G3P0020 at [redacted]w[redacted]d being seen today for ongoing prenatal care.  She is currently monitored for the following issues for this high-risk pregnancy and has Onychomycosis; Fam hx-ischem heart disease; Athlete's foot, left; Depression, major, recurrent; Morbid obesity; Difficulty controlling anger; Polycystic disease, ovaries; Murmur, cardiac; Severe dysmenorrhea; Moderate episode of recurrent major depressive disorder; Diabetes mellitus; Primary hypertension; Pure hypercholesterolemia; Borderline personality disorder in adult; Supervision of high risk pregnancy, antepartum; Heartburn in pregnancy in second trimester; Nausea and vomiting in pregnancy; Cramping affecting pregnancy, antepartum; Obesity in pregnancy, antepartum; Decreased fetal movement; and Labor and delivery, indication for care on their problem list.  ----------------------------------------------------------------------------------- Patient reports Here with Katherine Huynh. Having normal third trimester discomforts, but managing. Has not been ready about childbirth, does not seem too interested-prefers to see how it goes without setting any expectations. Encouraged pt to read a little about labor.   Unsure about breastfeeding, has read a little, does not know many people that have breastfeed. Encouraged pt to read and or take a class and have an open mind.  -pt did ask when do people typically go into labor, reviewed most people labor around their due date.  For her she can anticipate an induction around that time for high BMI, if labor does not happen.   Contractions: Not present. Vag. Bleeding: None.  Movement: Present. Leaking Fluid denies.  ----------------------------------------------------------------------------------- The following portions of the patient's history were reviewed and updated as appropriate: allergies, current medications, past family history, past medical  history, past social history, past surgical history and problem list. Problem list updated.  Objective  Blood pressure 109/73, pulse 96, weight (!) 321 lb (145.6 kg), last menstrual period 12/19/2021. Pregravid weight 321 lb (145.6 kg) Total Weight Gain 0 lb (0 kg) Urinalysis: Urine Protein    Urine Glucose    Fetal Status: Fetal Heart Rate (bpm): 143 Fundal Height: 38 cm Movement: Present     General:  Alert, oriented and cooperative. Patient is in no acute distress.  Skin: Skin is warm and dry. No rash noted.   Cardiovascular: Normal heart rate noted  Respiratory: Normal respiratory effort, no problems with respiration noted  Abdomen: Soft, gravid, appropriate for gestational age. Pain/Pressure: Absent     Pelvic:  Cervical exam deferred        Extremities: Normal range of motion.  Edema: None  Mental Status: Normal mood and affect. Normal behavior. Normal judgment and thought content.   Assessment   27 y.o. G3P0020 at [redacted]w[redacted]d by  10/11/2022, by Ultrasound presenting for routine prenatal visit  Plan   g3 Problems (from 02/02/22 to present)     Problem Noted Resolved   Supervision of high risk pregnancy, antepartum 02/13/2022 by Loran Senters, CMA No   Overview Addendum 08/02/2022 10:33 AM by Loney Laurence, CMA     Clinical Staff Provider  Office Location  [facility] Dating  09/25/2022, by Last Menstrual Period  Language  English Anatomy US    Flu Vaccine  Declined Genetic Screen  NIPS: Declined  TDaP vaccine  08/02/22 Hgb A1C or  GTT Early : Third trimester :   Covid declined   LAB RESULTS   Rhogam  O/Positive/-- (10/17 1451)  Blood Type O/Positive/-- (10/17 1451)   Feeding Plan breast Antibody Negative (10/17 1451)  Contraception undecided Rubella 1.04 (10/17 1451)  Circumcision undecided RPR Non Reactive (10/17 1451)   Pediatrician  [facility] HBsAg Negative (10/17 1451)   Support  Person Katherine Huynh HIV Non Reactive (10/17 1451)  Prenatal Classes no Varicella 135  Immune (10/17)    GBS  (For PCN allergy, check sensitivities)   BTL Consent  Hep C Non Reactive (10/17 1451)   VBAC Consent  Pap Diagnosis  Date Value Ref Range Status  11/15/2020   Final   - Negative for intraepithelial lesion or malignancy (NILM)      Hgb Electro      CF      SMA                   Preterm labor symptoms and general obstetric precautions including but not limited to vaginal bleeding, contractions, leaking of fluid and fetal movement were reviewed in detail with the patient. Please refer to After Visit Summary for other counseling recommendations.   Return in about 2 weeks (around 09/04/2022) for ROB.  Has next [place] 4/17  Carie Caddy, CNM   [facility]  08/21/22  4:54 PM

## 2022-08-22 NOTE — Telephone Encounter (Signed)
Pt was seen by LMD  08/21/22 at 2:55.

## 2022-08-23 ENCOUNTER — Encounter
Admission: RE | Admit: 2022-08-23 | Discharge: 2022-08-23 | Disposition: A | Discharge status: Home / Self Care | Payer: Medicaid Other | Source: Ambulatory Visit

## 2022-08-23 NOTE — Consult Note (Signed)
[facility] Anesthesia Consultation  Tanequa Scherschel WNI:[id number] DOB: [birth year] DOA: 08/23/2022 PCP: Patient, No Pcp Per   Requesting provider: Carie Caddy, CNM  Date of consultation: 4/11/233 Reason for consultation: Obesity during pregnancy  CHIEF COMPLAINT:  Obesity during pregnancy  HISTORY OF PRESENT ILLNESS: Shawnell Glew  is a 27 y.o. female with a known history of BMI 57  PAST MEDICAL HISTORY:   Past Medical History:  Diagnosis Date   Anemia    Anxiety    Bilateral swelling of feet    Constipation    Depression    Fam hx-ischem heart disease 09/15/2015   Heart murmur    Infertility, female    Morbid obesity 02/17/2016   Multiple food allergies    Other fatigue    Polycystic disease, ovaries    Prediabetes    Shortness of breath on exertion    Vitamin D deficiency     PAST SURGICAL HISTORY:  Past Surgical History:  Procedure Laterality Date   DG HYSTEROGRAM (HSD)  01/13/2020       HYSTEROSCOPY WITH D & C N/A 11/07/2020   Procedure: DILATATION AND CURETTAGE Melton Krebs;  Surgeon: Hildred Laser, MD;  Location: [facility] ORS;  Service: Gynecology;  Laterality: N/A;   tooth implant Right 02/11/2022    SOCIAL HISTORY:  Social History   Tobacco Use   Smoking status: Former    Types: Cigarettes    Quit date: 09/28/2016    Years since quitting: 5.9   Smokeless tobacco: Never  Substance Use Topics   Alcohol use: Not Currently    Alcohol/week: 6.0 standard drinks of alcohol    Types: 3 Glasses of wine, 2 Cans of beer, 1 Shots of liquor per week    Comment: occas    FAMILY HISTORY:  Family History  Problem Relation Age of Onset   Obesity Mother    Polycystic ovary syndrome Mother    Hypertension Father    Hyperlipidemia Father    Sleep apnea Father    Obesity Father    Alcohol abuse Sister    ADD / ADHD Sister    Cancer Maternal Grandmother        breast   Fibroids Maternal Grandmother    Uterine cancer Maternal  Grandmother 75   Heart disease Maternal Grandfather    Heart disease Paternal Grandfather     DRUG ALLERGIES:  Allergies  Allergen Reactions   Cinnamon Itching   Bonjesta [Doxylamine-Pyridoxine] Hives and Itching   Erythromycin Rash   Sulfa Antibiotics Rash    REVIEW OF SYSTEMS:   RESPIRATORY: No cough, shortness of breath, wheezing.  CARDIOVASCULAR: No chest pain, orthopnea, edema.  HEMATOLOGY: No anemia, easy bruising or bleeding SKIN: No rash or lesion. NEUROLOGIC: No tingling, numbness, weakness.  PSYCHIATRY: No anxiety or depression.   MEDICATIONS AT HOME:  Prior to Admission medications   Medication Sig Start Date End Date Taking? Authorizing Provider  aspirin EC 81 MG tablet Take 1 tablet (81 mg total) by mouth daily. Take after 12 weeks for prevention of preeclampssia later in pregnancy 03/14/22   Hildred Laser, MD  diphenhydrAMINE (BENADRYL) 25 mg capsule Take 1 capsule by mouth as needed.    [provider]  Doxylamine-Pyridoxine 10-10 MG TBEC Take 1 tablet by mouth daily. 08/01/22   Hildred Laser, MD  fluticasone (FLOVENT HFA) 220 MCG/ACT inhaler Inhale 1 puff into the lungs as needed. 03/28/21 03/28/22  [provider]  ondansetron (ZOFRAN-ODT) 4 MG disintegrating tablet Take 1 tablet (4 mg total)  by mouth every 8 (eight) hours as needed for nausea or vomiting. 08/21/22   Dominic, Courtney Heys, CNM  prenatal vitamin w/FE, FA (PRENATAL 1 + 1) 27-1 MG TABS tablet Take 1 tablet by mouth daily at 12 noon.    [provider]      PHYSICAL EXAMINATION:   VITAL SIGNS: Last menstrual period 12/19/2021.  GENERAL:  27 y.o.-year-old patient no acute distress.  HEENT: Head atraumatic, normocephalic. Oropharynx and nasopharynx clear. MP 2, TM distance >3 cm, normal mouth opening. LUNGS: No use of accessory muscles of respiration.   EXTREMITIES: No pedal edema, cyanosis, or clubbing.  NEUROLOGIC: normal gait PSYCHIATRIC: The patient is alert and  oriented x 3.  SKIN: No obvious rash, lesion, or ulcer.    IMPRESSION AND PLAN:   Tanner Rothman  is a 27 y.o. female presenting with obesity during pregnancy. BMI is currently 57 at [redacted] weeks gestation.   We discussed analgesic options during labor including epidural analgesia. Discussed that in obesity there can be increased difficulty with epidural placement or even failure of successful epidural. We also discussed that even after successful epidural placement there is increased risk of catheter migration out of the epidural space that would require catheter replacement. Discussed use of epidural vs spinal vs GA if cesarean delivery is required. Discussed increased risk of difficult intubation during pregnancy should an emergency cesarean delivery be required.   This patient is appropriate for anesthetic care at [facility] in [place].  We discussed the limitations of a community hospital including but not limited to staffing, imaging, medications, and blood products.  The patient is aware of these limitations.  All questions answered and concerns addressed.   Dorcas Carrow, MD  Anesthesiology

## 2022-08-27 ENCOUNTER — Other Ambulatory Visit: Payer: Self-pay

## 2022-08-27 DIAGNOSIS — O99213 Obesity complicating pregnancy, third trimester: Secondary | ICD-10-CM

## 2022-08-27 DIAGNOSIS — Z8679 Personal history of other diseases of the circulatory system: Secondary | ICD-10-CM

## 2022-08-27 DIAGNOSIS — E282 Polycystic ovarian syndrome: Secondary | ICD-10-CM

## 2022-08-27 DIAGNOSIS — O0990 Supervision of high risk pregnancy, unspecified, unspecified trimester: Secondary | ICD-10-CM

## 2022-08-29 ENCOUNTER — Encounter: Payer: Self-pay | Admitting: Obstetrics

## 2022-08-29 ENCOUNTER — Ambulatory Visit: Payer: Medicaid Other | Attending: Obstetrics

## 2022-08-29 ENCOUNTER — Ambulatory Visit: Payer: Medicaid Other

## 2022-08-29 ENCOUNTER — Other Ambulatory Visit: Payer: Self-pay | Admitting: Obstetrics

## 2022-08-29 ENCOUNTER — Other Ambulatory Visit: Payer: Self-pay

## 2022-08-29 DIAGNOSIS — O26893 Other specified pregnancy related conditions, third trimester: Secondary | ICD-10-CM | POA: Insufficient documentation

## 2022-08-29 DIAGNOSIS — O0990 Supervision of high risk pregnancy, unspecified, unspecified trimester: Secondary | ICD-10-CM

## 2022-08-29 DIAGNOSIS — Z8679 Personal history of other diseases of the circulatory system: Secondary | ICD-10-CM

## 2022-08-29 DIAGNOSIS — O10013 Pre-existing essential hypertension complicating pregnancy, third trimester: Secondary | ICD-10-CM | POA: Diagnosis not present

## 2022-08-29 DIAGNOSIS — E282 Polycystic ovarian syndrome: Secondary | ICD-10-CM | POA: Diagnosis not present

## 2022-08-29 DIAGNOSIS — O99213 Obesity complicating pregnancy, third trimester: Secondary | ICD-10-CM | POA: Diagnosis not present

## 2022-08-29 DIAGNOSIS — Z3A33 33 weeks gestation of pregnancy: Secondary | ICD-10-CM | POA: Insufficient documentation

## 2022-08-29 DIAGNOSIS — O0993 Supervision of high risk pregnancy, unspecified, third trimester: Secondary | ICD-10-CM | POA: Diagnosis not present

## 2022-08-29 NOTE — Procedures (Signed)
Katherine Huynh June 21, 1995 [redacted]w[redacted]d  Fetus A Non-Stress Test Interpretation for 08/29/22  Indication:  Obesity, H/O HTN,   Fetal Heart Rate A Mode: External Baseline Rate (A): 145 bpm Variability: Moderate Accelerations: 15 x 15 Decelerations: None Multiple birth?: No     Interpretation (Fetal Testing) Nonstress Test Interpretation: Reactive (Dr. Parke Poisson interpreted NST at the bedside)

## 2022-09-03 ENCOUNTER — Other Ambulatory Visit: Payer: Self-pay

## 2022-09-03 DIAGNOSIS — O99213 Obesity complicating pregnancy, third trimester: Secondary | ICD-10-CM

## 2022-09-03 DIAGNOSIS — O10913 Unspecified pre-existing hypertension complicating pregnancy, third trimester: Secondary | ICD-10-CM

## 2022-09-05 ENCOUNTER — Other Ambulatory Visit: Payer: Self-pay

## 2022-09-05 ENCOUNTER — Other Ambulatory Visit: Payer: Self-pay | Admitting: Maternal & Fetal Medicine

## 2022-09-05 ENCOUNTER — Ambulatory Visit: Payer: Medicaid Other | Attending: Maternal & Fetal Medicine

## 2022-09-05 VITALS — BP 128/84 | HR 108 | Temp 97.9°F | Ht 63.0 in | Wt 330.0 lb

## 2022-09-05 DIAGNOSIS — Z3A34 34 weeks gestation of pregnancy: Secondary | ICD-10-CM | POA: Insufficient documentation

## 2022-09-05 DIAGNOSIS — O99213 Obesity complicating pregnancy, third trimester: Secondary | ICD-10-CM

## 2022-09-05 DIAGNOSIS — O0990 Supervision of high risk pregnancy, unspecified, unspecified trimester: Secondary | ICD-10-CM

## 2022-09-05 DIAGNOSIS — O10913 Unspecified pre-existing hypertension complicating pregnancy, third trimester: Secondary | ICD-10-CM | POA: Insufficient documentation

## 2022-09-05 DIAGNOSIS — O10013 Pre-existing essential hypertension complicating pregnancy, third trimester: Secondary | ICD-10-CM

## 2022-09-06 ENCOUNTER — Ambulatory Visit (INDEPENDENT_AMBULATORY_CARE_PROVIDER_SITE_OTHER): Payer: Medicaid Other | Admitting: Obstetrics

## 2022-09-06 VITALS — BP 124/78 | HR 95 | Wt 328.0 lb

## 2022-09-06 DIAGNOSIS — Z3A35 35 weeks gestation of pregnancy: Secondary | ICD-10-CM

## 2022-09-06 DIAGNOSIS — O0990 Supervision of high risk pregnancy, unspecified, unspecified trimester: Secondary | ICD-10-CM

## 2022-09-06 DIAGNOSIS — Z113 Encounter for screening for infections with a predominantly sexual mode of transmission: Secondary | ICD-10-CM

## 2022-09-06 DIAGNOSIS — O0993 Supervision of high risk pregnancy, unspecified, third trimester: Secondary | ICD-10-CM

## 2022-09-06 NOTE — Progress Notes (Signed)
Routine Prenatal Care Visit  Subjective  Katherine Huynh is a 27 y.o. G3P0020 at [redacted]w[redacted]d being seen today for ongoing prenatal care.  She is currently monitored for the following issues for this high-risk pregnancy and has Onychomycosis; Fam hx-ischem heart disease; Athlete's foot, left; Depression, major, recurrent; Morbid obesity; Difficulty controlling anger; Polycystic disease, ovaries; Murmur, cardiac; Severe dysmenorrhea; Moderate episode of recurrent major depressive disorder; Diabetes mellitus; Primary hypertension; Pure hypercholesterolemia; Borderline personality disorder in adult; Supervision of high risk pregnancy, antepartum; Heartburn in pregnancy in second trimester; Nausea and vomiting in pregnancy; Cramping affecting pregnancy, antepartum; Obesity in pregnancy, antepartum; Decreased fetal movement; and Labor and delivery, indication for care on their problem list.  ----------------------------------------------------------------------------------- Patient reports no complaints.   Contractions: Regular. Vag. Bleeding: None.  Movement: Present. Leaking Fluid denies.  ----------------------------------------------------------------------------------- The following portions of the patient's history were reviewed and updated as appropriate: allergies, current medications, past family history, past medical history, past social history, past surgical history and problem list. Problem list updated.  Objective  Blood pressure 124/78, pulse 95, weight (!) 328 lb (148.8 kg), last menstrual period 12/19/2021. Pregravid weight 321 lb (145.6 kg) Total Weight Gain 7 lb (3.175 kg) Urinalysis: Urine Protein    Urine Glucose    Fetal Status:     Movement: Present     General:  Alert, oriented and cooperative. Patient is in no acute distress.  Skin: Skin is warm and dry. No rash noted.   Cardiovascular: Normal heart rate noted  Respiratory: Normal respiratory effort, no problems with respiration noted   Abdomen: Soft, gravid, appropriate for gestational age. Pain/Pressure: Present     Pelvic:   Cultures retrieved, no cervical check today         Extremities: Normal range of motion.  Edema: None  Mental Status: Normal mood and affect. Normal behavior. Normal judgment and thought content.   Assessment   27 y.o. G3P0020 at [redacted]w[redacted]d by  10/11/2022, by Ultrasound presenting for routine prenatal visit  Plan   g3 Problems (from 02/02/22 to present)     Problem Noted Resolved   Supervision of high risk pregnancy, antepartum 02/13/2022 by Loran Senters, CMA No   Overview Addendum 08/02/2022 10:33 AM by Loney Laurence, CMA     Clinical Staff Provider  Office Location  [facility] Dating  09/25/2022, by Last Menstrual Period  Language  English Anatomy US    Flu Vaccine  Declined Genetic Screen  NIPS: Declined  TDaP vaccine  08/02/22 Hgb A1C or  GTT Early : Third trimester :   Covid declined   LAB RESULTS   Rhogam  O/Positive/-- (10/17 1451)  Blood Type O/Positive/-- (10/17 1451)   Feeding Plan breast Antibody Negative (10/17 1451)  Contraception undecided Rubella 1.04 (10/17 1451)  Circumcision undecided RPR Non Reactive (10/17 1451)   Pediatrician  [facility] HBsAg Negative (10/17 1451)   Support Person [name] HIV Non Reactive (10/17 1451)  Prenatal Classes no Varicella 135 Immune (10/17)    GBS  (For PCN allergy, check sensitivities)   BTL Consent  Hep C Non Reactive (10/17 1451)   VBAC Consent  Pap Diagnosis  Date Value Ref Range Status  11/15/2020   Final   - Negative for intraepithelial lesion or malignancy (NILM)      Hgb Electro      CF      SMA                   Preterm labor symptoms and general  obstetric precautions including but not limited to vaginal bleeding, contractions, leaking of fluid and fetal movement were reviewed in detail with the patient. Please refer to After Visit Summary for other counseling recommendations.   GBS and cultures retrieved  today. Encouraged to walk daily, 5x weekly and get exercise ball to sit on to stretch and tone ligaments for birth.  Return in about 1 week (around 09/13/2022) for return OB.  Mirna Mires, CNM  09/06/2022 1:58 PM

## 2022-09-09 LAB — CULTURE, BETA STREP (GROUP B ONLY): Strep Gp B Culture: POSITIVE — AB

## 2022-09-10 ENCOUNTER — Encounter: Payer: Self-pay | Admitting: Obstetrics and Gynecology

## 2022-09-10 ENCOUNTER — Other Ambulatory Visit: Payer: Self-pay

## 2022-09-10 DIAGNOSIS — Z8679 Personal history of other diseases of the circulatory system: Secondary | ICD-10-CM

## 2022-09-10 DIAGNOSIS — O10913 Unspecified pre-existing hypertension complicating pregnancy, third trimester: Secondary | ICD-10-CM

## 2022-09-10 DIAGNOSIS — E282 Polycystic ovarian syndrome: Secondary | ICD-10-CM

## 2022-09-10 DIAGNOSIS — O99213 Obesity complicating pregnancy, third trimester: Secondary | ICD-10-CM

## 2022-09-10 DIAGNOSIS — K8020 Calculus of gallbladder without cholecystitis without obstruction: Secondary | ICD-10-CM

## 2022-09-10 LAB — GC/CHLAMYDIA PROBE AMP
Chlamydia trachomatis, NAA: NEGATIVE
Neisseria Gonorrhoeae by PCR: NEGATIVE

## 2022-09-11 ENCOUNTER — Other Ambulatory Visit: Payer: Medicaid Other

## 2022-09-11 DIAGNOSIS — K8020 Calculus of gallbladder without cholecystitis without obstruction: Secondary | ICD-10-CM

## 2022-09-12 ENCOUNTER — Other Ambulatory Visit: Payer: Medicaid Other

## 2022-09-13 LAB — BILE ACIDS, TOTAL: Bile Acids Total: 4.4 umol/L (ref 0.0–10.0)

## 2022-09-14 ENCOUNTER — Encounter: Payer: Self-pay | Admitting: Obstetrics and Gynecology

## 2022-09-14 ENCOUNTER — Ambulatory Visit (INDEPENDENT_AMBULATORY_CARE_PROVIDER_SITE_OTHER): Payer: Medicaid Other | Admitting: Obstetrics and Gynecology

## 2022-09-14 VITALS — BP 111/72 | HR 118 | Wt 337.0 lb

## 2022-09-14 DIAGNOSIS — E669 Obesity, unspecified: Secondary | ICD-10-CM

## 2022-09-14 DIAGNOSIS — Z3A36 36 weeks gestation of pregnancy: Secondary | ICD-10-CM

## 2022-09-14 DIAGNOSIS — Z8679 Personal history of other diseases of the circulatory system: Secondary | ICD-10-CM

## 2022-09-14 DIAGNOSIS — O0990 Supervision of high risk pregnancy, unspecified, unspecified trimester: Secondary | ICD-10-CM

## 2022-09-14 DIAGNOSIS — O99213 Obesity complicating pregnancy, third trimester: Secondary | ICD-10-CM

## 2022-09-14 DIAGNOSIS — O99820 Streptococcus B carrier state complicating pregnancy: Secondary | ICD-10-CM | POA: Insufficient documentation

## 2022-09-14 DIAGNOSIS — O99210 Obesity complicating pregnancy, unspecified trimester: Secondary | ICD-10-CM

## 2022-09-14 NOTE — Progress Notes (Signed)
ROB [redacted]w[redacted]d: She is doing well. She has some lower abdominal cramping that she describes as period cramps, they occur more frequently and mostly at night. She reports good fetal movement.

## 2022-09-14 NOTE — Progress Notes (Signed)
ROB: Patient is a 27 y.o. G3P0020 at [redacted]w[redacted]d who presents for OB care.  Pregnancy is complicated by  Pregnancy is complicated by morbid obesity, h/o PCOS . Patient has complaints of pelvic cramping, mostly in the evenings.  Denies vaginal bleeding.  Third trimester cultures performed last visit, GBS positive, will need antibiotics during labor.  Discussed IOL for increased BMI in 39th week.  Patient ok to be scheduled. Will select date at next visit.  Discussed current BMI of 59, [facility] cutoff is 60. Encouraged to maintain current weight. If BMI exceeds 60, will require transfer to tertiary center at time of delivery.  Has antenatal testing with MFM, next due next week on 5/8. Has completed both Anesthesia consults.  RTC in 1 week.

## 2022-09-17 ENCOUNTER — Other Ambulatory Visit: Payer: Self-pay

## 2022-09-17 DIAGNOSIS — O10913 Unspecified pre-existing hypertension complicating pregnancy, third trimester: Secondary | ICD-10-CM

## 2022-09-17 DIAGNOSIS — O99213 Obesity complicating pregnancy, third trimester: Secondary | ICD-10-CM

## 2022-09-17 DIAGNOSIS — E282 Polycystic ovarian syndrome: Secondary | ICD-10-CM

## 2022-09-19 ENCOUNTER — Other Ambulatory Visit: Payer: Self-pay

## 2022-09-19 ENCOUNTER — Ambulatory Visit: Payer: Medicaid Other | Attending: Maternal & Fetal Medicine

## 2022-09-19 VITALS — BP 139/91 | HR 103 | Temp 98.5°F | Ht 63.0 in | Wt 337.5 lb

## 2022-09-19 DIAGNOSIS — O10013 Pre-existing essential hypertension complicating pregnancy, third trimester: Secondary | ICD-10-CM | POA: Diagnosis not present

## 2022-09-19 DIAGNOSIS — Z3A36 36 weeks gestation of pregnancy: Secondary | ICD-10-CM

## 2022-09-19 DIAGNOSIS — O99213 Obesity complicating pregnancy, third trimester: Secondary | ICD-10-CM | POA: Diagnosis not present

## 2022-09-19 DIAGNOSIS — O0990 Supervision of high risk pregnancy, unspecified, unspecified trimester: Secondary | ICD-10-CM

## 2022-09-19 DIAGNOSIS — O10913 Unspecified pre-existing hypertension complicating pregnancy, third trimester: Secondary | ICD-10-CM

## 2022-09-19 DIAGNOSIS — E282 Polycystic ovarian syndrome: Secondary | ICD-10-CM

## 2022-09-20 ENCOUNTER — Encounter: Payer: Self-pay | Admitting: Obstetrics and Gynecology

## 2022-09-20 ENCOUNTER — Ambulatory Visit (INDEPENDENT_AMBULATORY_CARE_PROVIDER_SITE_OTHER): Payer: Medicaid Other | Admitting: Obstetrics and Gynecology

## 2022-09-20 VITALS — BP 113/64 | HR 116 | Wt 340.0 lb

## 2022-09-20 DIAGNOSIS — O0990 Supervision of high risk pregnancy, unspecified, unspecified trimester: Secondary | ICD-10-CM

## 2022-09-20 DIAGNOSIS — Z3A37 37 weeks gestation of pregnancy: Secondary | ICD-10-CM

## 2022-09-20 DIAGNOSIS — Z8679 Personal history of other diseases of the circulatory system: Secondary | ICD-10-CM

## 2022-09-20 DIAGNOSIS — O99213 Obesity complicating pregnancy, third trimester: Secondary | ICD-10-CM

## 2022-09-20 DIAGNOSIS — O99210 Obesity complicating pregnancy, unspecified trimester: Secondary | ICD-10-CM

## 2022-09-20 NOTE — Progress Notes (Signed)
ROB: Patient is a 27 y.o. G3P0020 at G3P0020 who presents for OB care.  Pregnancy is complicated by morbid obesity, h/o PCOS.  Had BPP performed by MFM yesterday, score 8/8.  Patient had an elevated BP at that visit of 138/91, with normal repeat.  On review of chart patient has remote history of hypertension however notes that she has never had to be on medications in the past.  3 pound weight gain noted between yesterday's visit and today.  Revisited discussion of IOL, currently to be scheduled at 39 weeks due to obesity however could consider in 30-week due to patient likely developing some hypertension towards the end of the pregnancy.  Patient unsure at this time, hesitant about an earlier induction.  I did discuss repeating a blood pressure check over the weekend at local pharmacy or fire station.  Today's BP was normal.  Patient unable to leave urine sample today so cannot assess for proteinuria.  Patient now with BMI of 60.  Reiterated [facility] cutoff is 60. Encouraged to maintain current weight. If BMI exceeds 60, will require transfer to tertiary center at time of delivery.  Discussed methods of small amounts of weight loss including walking daily for at least 20 minutes after dinner, decreasing consumption of sodas if she currently consumes.  RTC in 1 week.  Reiterated labor precautions.

## 2022-09-24 ENCOUNTER — Other Ambulatory Visit: Payer: Self-pay | Admitting: Licensed Practical Nurse

## 2022-09-24 ENCOUNTER — Other Ambulatory Visit: Payer: Self-pay | Admitting: Obstetrics and Gynecology

## 2022-09-24 DIAGNOSIS — O219 Vomiting of pregnancy, unspecified: Secondary | ICD-10-CM

## 2022-09-24 DIAGNOSIS — Z3490 Encounter for supervision of normal pregnancy, unspecified, unspecified trimester: Secondary | ICD-10-CM

## 2022-09-25 ENCOUNTER — Other Ambulatory Visit: Payer: Self-pay

## 2022-09-25 DIAGNOSIS — E282 Polycystic ovarian syndrome: Secondary | ICD-10-CM

## 2022-09-25 DIAGNOSIS — O99213 Obesity complicating pregnancy, third trimester: Secondary | ICD-10-CM

## 2022-09-25 DIAGNOSIS — Z8679 Personal history of other diseases of the circulatory system: Secondary | ICD-10-CM

## 2022-09-26 ENCOUNTER — Other Ambulatory Visit: Payer: Self-pay

## 2022-09-26 ENCOUNTER — Ambulatory Visit (INDEPENDENT_AMBULATORY_CARE_PROVIDER_SITE_OTHER): Payer: Medicaid Other | Admitting: Obstetrics and Gynecology

## 2022-09-26 ENCOUNTER — Ambulatory Visit: Payer: Medicaid Other | Attending: Obstetrics

## 2022-09-26 ENCOUNTER — Encounter: Payer: Self-pay | Admitting: Obstetrics and Gynecology

## 2022-09-26 VITALS — BP 134/74 | HR 103 | Temp 98.7°F | Ht 63.0 in | Wt 346.5 lb

## 2022-09-26 VITALS — BP 116/71 | HR 108 | Wt 346.6 lb

## 2022-09-26 DIAGNOSIS — Z8679 Personal history of other diseases of the circulatory system: Secondary | ICD-10-CM

## 2022-09-26 DIAGNOSIS — O99213 Obesity complicating pregnancy, third trimester: Secondary | ICD-10-CM | POA: Diagnosis not present

## 2022-09-26 DIAGNOSIS — O10013 Pre-existing essential hypertension complicating pregnancy, third trimester: Secondary | ICD-10-CM | POA: Diagnosis not present

## 2022-09-26 DIAGNOSIS — O0990 Supervision of high risk pregnancy, unspecified, unspecified trimester: Secondary | ICD-10-CM

## 2022-09-26 DIAGNOSIS — Z3A37 37 weeks gestation of pregnancy: Secondary | ICD-10-CM

## 2022-09-26 DIAGNOSIS — E282 Polycystic ovarian syndrome: Secondary | ICD-10-CM

## 2022-09-26 DIAGNOSIS — Z3483 Encounter for supervision of other normal pregnancy, third trimester: Secondary | ICD-10-CM

## 2022-09-26 NOTE — Progress Notes (Signed)
ROB: Patient is a 27 y.o. G3P0020 at G3P0020 who presents for high risk OB care.  Pregnancy is complicated by morbid obesity, h/o PCOS, h/o chronic HTN (no meds).  Had BPP performed by MFM today, score 8/8, normal AFI.  Patient unable to leave urine sample again today so cannot assess for proteinuria.  Patient now with BMI of > 60.  Reiterated [facility] cutoff is 60. Will likely require transfer to tertiary center at time of delivery, however unable to coordinate in short time frame for patient to be scheduled at different facility so may have to be hospital transfer once she arrives for scheduled IOL .  Discussed methods of small amounts of weight loss.  IOL scheduled for 5/17. Reiterated labor precautions.

## 2022-09-26 NOTE — Progress Notes (Signed)
ROB [redacted]w[redacted]d: She is feeling tired today. She has been having some contractions on and off. She reports good fetal movement. She was unable to leave a urine sample again today.

## 2022-09-27 ENCOUNTER — Other Ambulatory Visit: Payer: Self-pay | Admitting: Obstetrics

## 2022-09-28 ENCOUNTER — Inpatient Hospital Stay: Payer: Medicaid Other | Admitting: Anesthesiology

## 2022-09-28 ENCOUNTER — Other Ambulatory Visit: Payer: Self-pay

## 2022-09-28 ENCOUNTER — Encounter: Payer: Self-pay | Admitting: Obstetrics and Gynecology

## 2022-09-28 ENCOUNTER — Inpatient Hospital Stay
Admission: EM | Admit: 2022-09-28 | Discharge: 2022-10-01 | DRG: 786 | Disposition: A | Discharge status: Home / Self Care | Payer: Medicaid Other | Attending: Obstetrics and Gynecology | Admitting: Obstetrics and Gynecology

## 2022-09-28 DIAGNOSIS — I952 Hypotension due to drugs: Secondary | ICD-10-CM | POA: Diagnosis not present

## 2022-09-28 DIAGNOSIS — T413X5A Adverse effect of local anesthetics, initial encounter: Secondary | ICD-10-CM | POA: Diagnosis not present

## 2022-09-28 DIAGNOSIS — O10913 Unspecified pre-existing hypertension complicating pregnancy, third trimester: Secondary | ICD-10-CM

## 2022-09-28 DIAGNOSIS — O99214 Obesity complicating childbirth: Secondary | ICD-10-CM | POA: Diagnosis present

## 2022-09-28 DIAGNOSIS — O411230 Chorioamnionitis, third trimester, not applicable or unspecified: Secondary | ICD-10-CM | POA: Diagnosis present

## 2022-09-28 DIAGNOSIS — O9903 Anemia complicating the puerperium: Secondary | ICD-10-CM | POA: Diagnosis not present

## 2022-09-28 DIAGNOSIS — O99284 Endocrine, nutritional and metabolic diseases complicating childbirth: Secondary | ICD-10-CM | POA: Diagnosis present

## 2022-09-28 DIAGNOSIS — E282 Polycystic ovarian syndrome: Secondary | ICD-10-CM | POA: Diagnosis present

## 2022-09-28 DIAGNOSIS — O114 Pre-existing hypertension with pre-eclampsia, complicating childbirth: Secondary | ICD-10-CM | POA: Diagnosis present

## 2022-09-28 DIAGNOSIS — O99824 Streptococcus B carrier state complicating childbirth: Secondary | ICD-10-CM | POA: Diagnosis present

## 2022-09-28 DIAGNOSIS — O99820 Streptococcus B carrier state complicating pregnancy: Secondary | ICD-10-CM | POA: Diagnosis not present

## 2022-09-28 DIAGNOSIS — Z3490 Encounter for supervision of normal pregnancy, unspecified, unspecified trimester: Secondary | ICD-10-CM

## 2022-09-28 DIAGNOSIS — O9902 Anemia complicating childbirth: Secondary | ICD-10-CM | POA: Diagnosis present

## 2022-09-28 DIAGNOSIS — Z3A38 38 weeks gestation of pregnancy: Secondary | ICD-10-CM | POA: Diagnosis not present

## 2022-09-28 DIAGNOSIS — O119 Pre-existing hypertension with pre-eclampsia, unspecified trimester: Secondary | ICD-10-CM | POA: Diagnosis present

## 2022-09-28 DIAGNOSIS — O0990 Supervision of high risk pregnancy, unspecified, unspecified trimester: Principal | ICD-10-CM

## 2022-09-28 DIAGNOSIS — D62 Acute posthemorrhagic anemia: Secondary | ICD-10-CM | POA: Diagnosis not present

## 2022-09-28 DIAGNOSIS — O1092 Unspecified pre-existing hypertension complicating childbirth: Secondary | ICD-10-CM | POA: Diagnosis present

## 2022-09-28 HISTORY — DX: Unspecified pre-existing hypertension complicating pregnancy, third trimester: O10.913

## 2022-09-28 LAB — CBC
HCT: 34.2 % — ABNORMAL LOW (ref 36.0–46.0)
HCT: 33.8 % — ABNORMAL LOW (ref 36.0–46.0)
Hemoglobin: 10.9 g/dL — ABNORMAL LOW (ref 12.0–15.0)
Hemoglobin: 11 g/dL — ABNORMAL LOW (ref 12.0–15.0)
MCH: 25.6 pg — ABNORMAL LOW (ref 26.0–34.0)
MCH: 25.9 pg — ABNORMAL LOW (ref 26.0–34.0)
MCHC: 31.9 g/dL (ref 30.0–36.0)
MCHC: 32.5 g/dL (ref 30.0–36.0)
MCV: 80.3 fL (ref 80.0–100.0)
MCV: 79.5 fL — ABNORMAL LOW (ref 80.0–100.0)
Platelets: 278 10*3/uL (ref 150–400)
Platelets: 257 10*3/uL (ref 150–400)
RBC: 4.26 MIL/uL (ref 3.87–5.11)
RBC: 4.25 MIL/uL (ref 3.87–5.11)
RDW: 13.8 % (ref 11.5–15.5)
RDW: 13.9 % (ref 11.5–15.5)
WBC: 7.2 10*3/uL (ref 4.0–10.5)
WBC: 8.7 10*3/uL (ref 4.0–10.5)
nRBC: 0 % (ref 0.0–0.2)
nRBC: 0 % (ref 0.0–0.2)

## 2022-09-28 LAB — PROTEIN / CREATININE RATIO, URINE
Creatinine, Urine: 228 mg/dL
Protein Creatinine Ratio: 0.43 mg/mg{Cre} — ABNORMAL HIGH (ref 0.00–0.15)
Total Protein, Urine: 98 mg/dL

## 2022-09-28 LAB — COMPREHENSIVE METABOLIC PANEL
ALT: 13 U/L (ref 0–44)
AST: 20 U/L (ref 15–41)
Albumin: 3.1 g/dL — ABNORMAL LOW (ref 3.5–5.0)
Alkaline Phosphatase: 122 U/L (ref 38–126)
Anion gap: 8 (ref 5–15)
BUN: 9 mg/dL (ref 6–20)
CO2: 21 mmol/L — ABNORMAL LOW (ref 22–32)
Calcium: 8.7 mg/dL — ABNORMAL LOW (ref 8.9–10.3)
Chloride: 107 mmol/L (ref 98–111)
Creatinine, Ser: 0.65 mg/dL (ref 0.44–1.00)
GFR, Estimated: >60 mL/min (ref >60)
Glucose, Bld: 94 mg/dL (ref 70–99)
Potassium: 3.8 mmol/L (ref 3.5–5.1)
Sodium: 136 mmol/L (ref 135–145)
Total Bilirubin: 0.7 mg/dL (ref 0.3–1.2)
Total Protein: 7.4 g/dL (ref 6.5–8.1)

## 2022-09-28 LAB — TYPE AND SCREEN
ABO/RH(D): O POS
Antibody Screen: NEGATIVE

## 2022-09-28 MED ORDER — MISOPROSTOL 200 MCG PO TABS
ORAL_TABLET | ORAL | Status: AC
  Filled 2022-09-28: qty 4

## 2022-09-28 MED ORDER — ACETAMINOPHEN 325 MG PO TABS: 650.0000 mg | ORAL_TABLET | ORAL | Status: DC | PRN

## 2022-09-28 MED ORDER — OXYTOCIN-SODIUM CHLORIDE 30-0.9 UT/500ML-% IV SOLN
INTRAVENOUS | Status: AC
  Filled 2022-09-28: qty 500

## 2022-09-28 MED ORDER — GENTAMICIN SULFATE 40 MG/ML IJ SOLN
2.0000 mg/kg | INTRAVENOUS | Status: DC
  Administered 2022-09-29 – 2022-09-30 (×2): 310 mg via INTRAVENOUS
  Filled 2022-09-28 (×2): qty 7.75

## 2022-09-28 MED ORDER — FENTANYL-BUPIVACAINE-NACL 0.5-0.125-0.9 MG/250ML-% EP SOLN
EPIDURAL | Status: AC
  Filled 2022-09-28: qty 250

## 2022-09-28 MED ORDER — EPHEDRINE 5 MG/ML INJ
10.0000 mg | INTRAVENOUS | Status: AC | PRN
  Administered 2022-09-28 (×2): 10 mg via INTRAVENOUS

## 2022-09-28 MED ORDER — OXYTOCIN-SODIUM CHLORIDE 30-0.9 UT/500ML-% IV SOLN
1.0000 m[IU]/min | INTRAVENOUS | Status: DC
  Administered 2022-09-28: 2 m[IU]/min via INTRAVENOUS

## 2022-09-28 MED ORDER — LACTATED RINGERS IV SOLN
500.0000 mL | Freq: Once | INTRAVENOUS | Status: AC
  Administered 2022-09-28: 500 mL via INTRAVENOUS

## 2022-09-28 MED ORDER — SOD CITRATE-CITRIC ACID 500-334 MG/5ML PO SOLN: 30.0000 mL | ORAL | Status: DC | PRN

## 2022-09-28 MED ORDER — ONDANSETRON HCL 4 MG/2ML IJ SOLN: 4.0000 mg | Freq: Four times a day (QID) | INTRAMUSCULAR | Status: DC | PRN

## 2022-09-28 MED ORDER — MEPERIDINE HCL 25 MG/ML IJ SOLN
50.0000 mg | INTRAMUSCULAR | Status: DC | PRN
  Filled 2022-09-28: qty 4

## 2022-09-28 MED ORDER — OXYCODONE-ACETAMINOPHEN 5-325 MG PO TABS: 1.0000 | ORAL_TABLET | ORAL | Status: DC | PRN

## 2022-09-28 MED ORDER — OXYTOCIN 10 UNIT/ML IJ SOLN
INTRAMUSCULAR | Status: AC
  Filled 2022-09-28: qty 2

## 2022-09-28 MED ORDER — NALBUPHINE HCL 10 MG/ML IJ SOLN: 10.0000 mg | INTRAMUSCULAR | Status: DC | PRN

## 2022-09-28 MED ORDER — EPHEDRINE 5 MG/ML INJ
10.0000 mg | INTRAVENOUS | Status: DC | PRN
  Administered 2022-09-28: 10 mg via INTRAVENOUS
  Filled 2022-09-28 (×2): qty 5

## 2022-09-28 MED ORDER — AMMONIA AROMATIC IN INHA
RESPIRATORY_TRACT | Status: AC
  Filled 2022-09-28: qty 10

## 2022-09-28 MED ORDER — LACTATED RINGERS IV BOLUS: 1000.0000 mL | Freq: Once | INTRAVENOUS | Status: DC

## 2022-09-28 MED ORDER — TERBUTALINE SULFATE 1 MG/ML IJ SOLN
0.2500 mg | Freq: Once | INTRAMUSCULAR | Status: AC | PRN
  Administered 2022-09-28: 0.25 mg via SUBCUTANEOUS
  Filled 2022-09-28: qty 1

## 2022-09-28 MED ORDER — LACTATED RINGERS IV SOLN: INTRAVENOUS | Status: DC

## 2022-09-28 MED ORDER — FENTANYL-BUPIVACAINE-NACL 0.5-0.125-0.9 MG/250ML-% EP SOLN
12.0000 mL/h | EPIDURAL | Status: DC | PRN
  Administered 2022-09-28: 12 mL/h via EPIDURAL

## 2022-09-28 MED ORDER — MISOPROSTOL 50MCG HALF TABLET
50.0000 ug | ORAL_TABLET | Freq: Once | ORAL | Status: DC
  Filled 2022-09-28: qty 1

## 2022-09-28 MED ORDER — PHENYLEPHRINE 80 MCG/ML (10ML) SYRINGE FOR IV PUSH (FOR BLOOD PRESSURE SUPPORT): 80.0000 ug | PREFILLED_SYRINGE | INTRAVENOUS | Status: DC | PRN

## 2022-09-28 MED ORDER — LIDOCAINE-EPINEPHRINE (PF) 1.5 %-1:200000 IJ SOLN
INTRAMUSCULAR | Status: DC | PRN
  Administered 2022-09-28: 3 mL via EPIDURAL

## 2022-09-28 MED ORDER — LIDOCAINE HCL (PF) 1 % IJ SOLN: 30.0000 mL | INTRAMUSCULAR | Status: DC | PRN

## 2022-09-28 MED ORDER — FENTANYL CITRATE (PF) 100 MCG/2ML IJ SOLN
50.0000 ug | INTRAMUSCULAR | Status: DC | PRN
  Administered 2022-09-28: 100 ug via INTRAVENOUS
  Filled 2022-09-28: qty 2

## 2022-09-28 MED ORDER — MISOPROSTOL 50MCG HALF TABLET
50.0000 ug | ORAL_TABLET | ORAL | Status: DC | PRN
  Administered 2022-09-28 (×2): 50 ug via VAGINAL
  Filled 2022-09-28 (×2): qty 1

## 2022-09-28 MED ORDER — OXYTOCIN BOLUS FROM INFUSION: 333.0000 mL | Freq: Once | INTRAVENOUS | Status: DC

## 2022-09-28 MED ORDER — LIDOCAINE HCL (PF) 1 % IJ SOLN
INTRAMUSCULAR | Status: AC
  Filled 2022-09-28: qty 30

## 2022-09-28 MED ORDER — MEPERIDINE HCL 25 MG/ML IJ SOLN
INTRAMUSCULAR | Status: AC
  Administered 2022-09-28: 100 mg via INTRAVENOUS
  Filled 2022-09-28: qty 4

## 2022-09-28 MED ORDER — OXYCODONE-ACETAMINOPHEN 5-325 MG PO TABS: 2.0000 | ORAL_TABLET | ORAL | Status: DC | PRN

## 2022-09-28 MED ORDER — LACTATED RINGERS IV SOLN
500.0000 mL | INTRAVENOUS | Status: DC | PRN
  Administered 2022-09-28: 500 mL via INTRAVENOUS

## 2022-09-28 MED ORDER — LIDOCAINE HCL (PF) 1 % IJ SOLN
INTRAMUSCULAR | Status: DC | PRN
  Administered 2022-09-28: 1 mL

## 2022-09-28 MED ORDER — MISOPROSTOL 50MCG HALF TABLET
50.0000 ug | ORAL_TABLET | Freq: Once | ORAL | Status: AC
  Administered 2022-09-28: 50 ug via ORAL
  Filled 2022-09-28: qty 1

## 2022-09-28 MED ORDER — SODIUM CHLORIDE 0.9 % IV SOLN
2.0000 g | INTRAVENOUS | Status: DC
  Administered 2022-09-29 (×2): 2 g via INTRAVENOUS
  Filled 2022-09-28 (×2): qty 2000

## 2022-09-28 MED ORDER — OXYTOCIN-SODIUM CHLORIDE 30-0.9 UT/500ML-% IV SOLN
2.5000 [IU]/h | INTRAVENOUS | Status: DC
  Administered 2022-09-29 (×2): 2.5 [IU]/h via INTRAVENOUS
  Filled 2022-09-28: qty 500

## 2022-09-28 MED ORDER — PENICILLIN G POT IN DEXTROSE 60000 UNIT/ML IV SOLN: 3.0000 10*6.[IU] | INTRAVENOUS | Status: DC

## 2022-09-28 MED ORDER — SODIUM CHLORIDE 0.9 % IV SOLN
5.0000 10*6.[IU] | Freq: Once | INTRAVENOUS | Status: AC
  Administered 2022-09-28: 5 10*6.[IU] via INTRAVENOUS
  Filled 2022-09-28: qty 5

## 2022-09-28 NOTE — H&P (Signed)
History and Physical   HPI  Katherine Huynh is a 27 y.o. G3P0020 at [redacted]w[redacted]d Estimated Date of Delivery: 10/11/22 by 7wk5d u/s, who is being admitted for induction of labor for elevated BMI and hx of CHTN. Reports good fetal movement. Denies VB, LOF, HA, visual changes or RUQ pain.   [name] started prenatal care at 6 weeks. Had consistent and routine care complicated by [facility], PCOS, prediabetes (Hgb A1c 5.6 at NOB, 1hr GTT 118), GBS positive, hx of depression/anxiety, dx and elevated BMI (pregravid BMI was 56, current BMI 61.31). Patient states she was never told that she had hypertension. Chart review shows problem list item of primary hypertension being added by Dr. Dalbert Garnet on 06/23/2020 although the note from that visit showed a BP of 129/85. At her 19 week visit she had her first elevated BP in pregnancy at 137/92. Had second elevated pressure of 139/91 at 36wk6d but all pressures have been normal since. Her hx is more consistent with dx of gestation hypertension. Last growth scan was on 09/05/22 and showed an EFW of 2857g, or 6lb 5oz which was 82%.   OB History  OB History  Gravida Para Term Preterm AB Living  3 0 0 0 2 0  SAB IAB Ectopic Multiple Live Births  2 0 0 0 0    # Outcome Date GA Lbr Len/2nd Weight Sex Delivery Anes PTL Lv  3 Current           2 SAB 2019          1 SAB 2017            PROBLEM LIST  Pregnancy complications or risks: Patient Active Problem List   Diagnosis Date Noted   Chronic hypertension with exacerbation during pregnancy in third trimester 09/28/2022   Group B Streptococcus carrier, +RV culture, currently pregnant 09/14/2022   Labor and delivery, indication for care 08/04/2022   Decreased fetal movement 07/29/2022   Obesity in pregnancy, antepartum 07/18/2022   Cramping affecting pregnancy, antepartum 07/15/2022   Heartburn in pregnancy in second trimester 05/12/2022   Nausea and vomiting in pregnancy 05/12/2022   Supervision of high risk  pregnancy, antepartum 02/13/2022   Diabetes mellitus (HCC) 06/23/2020   Primary hypertension 06/23/2020   Pure hypercholesterolemia 06/23/2020   Severe dysmenorrhea 05/30/2018   Moderate episode of recurrent major depressive disorder (HCC) 05/30/2018   Borderline personality disorder in adult [facility]) 01/15/2018   Murmur, cardiac 10/12/2016   Polycystic disease, ovaries 10/02/2016   Depression, major, recurrent (HCC) 02/17/2016   Morbid obesity (HCC) 02/17/2016   Difficulty controlling anger 02/17/2016   Athlete's foot, left 10/10/2015   Onychomycosis 09/15/2015   Fam hx-ischem heart disease 09/15/2015    Prenatal labs and studies: ABO, Rh: --/--/O POS (05/17 1006) Antibody: NEG (05/17 1006) Rubella: 1.04 (10/17 1451) RPR: Non Reactive (03/21 1104)  HBsAg: Negative (10/17 1451)  HIV: Non Reactive (10/17 1451)  MVH:QIONGEXB/-- (04/25 1502) 1hr GTT 118 H&H:  Hemoglobin  Date Value Ref Range Status  09/28/2022 10.9 (L) 12.0 - 15.0 g/dL Final  28/41/3244 01.0 (L) 11.1 - 15.9 g/dL Final  27/25/3664 40.3 12.0 - 15.0 g/dL Final  47/42/5956 38.7 11.1 - 15.9 g/dL Final  56/43/3295 18.8 (L) 12.0 - 15.0 g/dL Final  41/66/0630 16.0 12.0 - 15.0 g/dL Final  10/93/2355 73.2 11.1 - 15.9 g/dL Final  20/25/4270 62.3 11.1 - 15.9 g/dL Final   & hematocrit  GC/CT: neg/neg   Past Medical History:  Diagnosis Date  Anemia    Anxiety    Bilateral swelling of feet    Constipation    Depression    Fam hx-ischem heart disease 09/15/2015   Heart murmur    Infertility, female    Morbid obesity (HCC) 02/17/2016   Multiple food allergies    Other fatigue    Polycystic disease, ovaries    Prediabetes    Shortness of breath on exertion    Vitamin D deficiency      Past Surgical History:  Procedure Laterality Date   DG HYSTEROGRAM (HSD)  01/13/2020       HYSTEROSCOPY WITH D & C N/A 11/07/2020   Procedure: DILATATION AND CURETTAGE /HYSTEROSCOPY;  Surgeon: Hildred Laser, MD;  Location:  [facility] ORS;  Service: Gynecology;  Laterality: N/A;   tooth implant Right 02/11/2022     Medications    Current Discharge Medication List     CONTINUE these medications which have NOT CHANGED   Details  aspirin EC 81 MG tablet Take 1 tablet (81 mg total) by mouth daily. Take after 12 weeks for prevention of preeclampssia later in pregnancy Qty: 300 tablet, Refills: 2    diphenhydrAMINE (BENADRYL) 25 mg capsule Take 1 capsule by mouth as needed.    ondansetron (ZOFRAN-ODT) 4 MG disintegrating tablet DISSOLVE 1 TABLET IN MOUTH EVERY 8 HOURS AS NEEDED FOR NAUSEA FOR VOMITING Qty: 20 tablet, Refills: 0   Associated Diagnoses: Nausea and vomiting in pregnancy    prenatal vitamin w/FE, FA (PRENATAL 1 + 1) 27-1 MG TABS tablet Take 1 tablet by mouth daily at 12 noon.    Doxylamine-Pyridoxine 10-10 MG TBEC Take 1 tablet by mouth daily. Qty: 60 tablet, Refills: 1    fluticasone (FLOVENT HFA) 220 MCG/ACT inhaler Inhale 1 puff into the lungs as needed.         Allergies  Cinnamon, Orange (diagnostic), Bonjesta [doxylamine-pyridoxine], Erythromycin, and Sulfa antibiotics  Review of Systems  Pertinent items are noted in HPI.  Physical Exam  Temp 99 F (37.2 C) (Oral)   Resp 18   Ht 5\' 3"  (1.6 m)   Wt (!) 157 kg   LMP 12/19/2021 (Exact Date)   BMI 61.31 kg/m   Lungs:  CTA B Cardio: S1S2, RRR Abd: Soft, gravid, NT Presentation: cephalic, confirmed by u/s DTRs: 2+ B SVE: FT/thick/-2, anterior  FHR 150, mod variability, pos accels, no decels Toco no contractions detected   Test Results  Results for orders placed or performed during the hospital encounter of 09/28/22 (from the past 24 hour(s))  CBC     Status: Abnormal   Collection Time: 09/28/22 10:06 AM  Result Value Ref Range   WBC 7.2 4.0 - 10.5 K/uL   RBC 4.26 3.87 - 5.11 MIL/uL   Hemoglobin 10.9 (L) 12.0 - 15.0 g/dL   HCT 40.9 (L) 81.1 - 91.4 %   MCV 80.3 80.0 - 100.0 fL   MCH 25.6 (L) 26.0 - 34.0 pg   MCHC  31.9 30.0 - 36.0 g/dL   RDW 78.2 95.6 - 21.3 %   Platelets 278 150 - 400 K/uL   nRBC 0.0 0.0 - 0.2 %  Type and screen     Status: None   Collection Time: 09/28/22 10:06 AM  Result Value Ref Range   ABO/RH(D) O POS    Antibody Screen NEG    Sample Expiration      10/01/2022,2359 Performed at [facility], [address], [place], [place] [address]    Group B Strep  positive  Assessment  G3P0020 at [redacted]w[redacted]d Estimated Date of Delivery: 10/11/22  RNST IOL for elevated BMI/GHTN GBS pos  Patient Active Problem List   Diagnosis Date Noted   Chronic hypertension with exacerbation during pregnancy in third trimester 09/28/2022   Group B Streptococcus carrier, +RV culture, currently pregnant 09/14/2022   Labor and delivery, indication for care 08/04/2022   Decreased fetal movement 07/29/2022   Obesity in pregnancy, antepartum 07/18/2022   Cramping affecting pregnancy, antepartum 07/15/2022   Heartburn in pregnancy in second trimester 05/12/2022   Nausea and vomiting in pregnancy 05/12/2022   Supervision of high risk pregnancy, antepartum 02/13/2022   Diabetes mellitus (HCC) 06/23/2020   Primary hypertension 06/23/2020   Pure hypercholesterolemia 06/23/2020   Severe dysmenorrhea 05/30/2018   Moderate episode of recurrent major depressive disorder (HCC) 05/30/2018   Borderline personality disorder in adult [facility]) 01/15/2018   Murmur, cardiac 10/12/2016   Polycystic disease, ovaries 10/02/2016   Depression, major, recurrent (HCC) 02/17/2016   Morbid obesity (HCC) 02/17/2016   Difficulty controlling anger 02/17/2016   Athlete's foot, left 10/10/2015   Onychomycosis 09/15/2015   Fam hx-ischem heart disease 09/15/2015    Plan  1. Admit to L&D   2. EFM per unit policy 3. Labs : T&S, CBC, RPR, CMP, PC ratio 4. Spoke with Dr. Valentino Saxon regarding patient and plan of care, Dr. Valentino Saxon will be primarily managing this patient due to BMI 5. IOL started with misoprostol PO,  PV 6. PCN for GBS prophylaxis once in active labor  Raeford Razor, CNM, FNP 09/28/2022 11:30 AM

## 2022-09-28 NOTE — Anesthesia Procedure Notes (Signed)
Epidural Patient location during procedure: OB Start time: 09/28/2022 9:02 PM End time: 09/28/2022 9:20 PM  Staffing Anesthesiologist: Foye Deer, MD Performed: anesthesiologist   Preanesthetic Checklist Completed: patient identified, IV checked, site marked, risks and benefits discussed, surgical consent, monitors and equipment checked, pre-op evaluation and timeout performed  Epidural Patient position: sitting Prep: ChloraPrep Patient monitoring: heart rate, continuous pulse ox and blood pressure Approach: midline Location: L3-L4 Injection technique: LOR saline  Needle:  Needle type: Tuohy  Needle gauge: 18 G Needle length: 9 cm Needle insertion depth: 8 cm Catheter type: closed end Catheter size: 20 Guage Catheter at skin depth: 13 cm Test dose: negative and 1.5% lidocaine with Epi 1:200 K  Assessment Events: blood not aspirated, no cerebrospinal fluid, injection not painful, no injection resistance and no paresthesia  Additional Notes Reason for block:procedure for pain

## 2022-09-28 NOTE — Anesthesia Preprocedure Evaluation (Signed)
Anesthesia Evaluation  Patient identified by MRN, date of birth, ID band Patient awake    Reviewed: Allergy & Precautions, H&P , NPO status , Patient's Chart, lab work & pertinent test results  Airway Mallampati: III  TM Distance: >3 FB Neck ROM: full    Dental no notable dental hx.    Pulmonary neg pulmonary ROS, former smoker   Pulmonary exam normal        Cardiovascular Exercise Tolerance: Good hypertension (gestational hypertension. now pre-eclampsia), Normal cardiovascular exam     Neuro/Psych  PSYCHIATRIC DISORDERS Anxiety        GI/Hepatic negative GI ROS,,,  Endo/Other    Morbid obesity  Renal/GU   negative genitourinary   Musculoskeletal   Abdominal  (+) + obese  Peds  Hematology  (+) Blood dyscrasia, anemia   Anesthesia Other Findings Past Medical History: No date: Anemia No date: Anxiety No date: Bilateral swelling of feet No date: Constipation No date: Depression 09/15/2015: Fam hx-ischem heart disease No date: Heart murmur No date: Infertility, female 02/17/2016: Morbid obesity (HCC) No date: Multiple food allergies No date: Other fatigue No date: Polycystic disease, ovaries No date: Prediabetes No date: Shortness of breath on exertion No date: Vitamin D deficiency  Past Surgical History: 01/13/2020: DG HYSTEROGRAM (HSD)     Comment:    11/07/2020: HYSTEROSCOPY WITH D & C; N/A     Comment:  Procedure: DILATATION AND CURETTAGE /HYSTEROSCOPY;                Surgeon: Hildred Laser, MD;  Location: [facility] ORS;                Service: Gynecology;  Laterality: N/A; 02/11/2022: tooth implant; Right  BMI    Body Mass Index: 61.31 kg/m      Reproductive/Obstetrics (+) Pregnancy                              Anesthesia Physical Anesthesia Plan  ASA: 3  Anesthesia Plan: Epidural   Post-op Pain Management:    Induction: Intravenous  PONV Risk Score and Plan:    Airway Management Planned:   Additional Equipment:   Intra-op Plan:   Post-operative Plan:   Informed Consent: I have reviewed the patients History and Physical, chart, labs and discussed the procedure including the risks, benefits and alternatives for the proposed anesthesia with the patient or authorized representative who has indicated his/her understanding and acceptance.       Plan Discussed with: Anesthesiologist and CRNA  Anesthesia Plan Comments:          Anesthesia Quick Evaluation

## 2022-09-28 NOTE — Progress Notes (Signed)
.  Intrapartum Progress Note  S: Patient complaining of pain with contractions. Thinks her water may have broken.   O: Blood pressure (!) 152/95, pulse 98, temperature 98.5 F (36.9 C), temperature source Oral, resp. rate 18, height 5\' 3"  (1.6 m), weight (!) 157 kg, last menstrual period 12/19/2021. Gen App: NAD, uncomfortable with contractions Abdomen: soft, gravid FHT: baseline 150 bpm.  Accels present.  Decels present - 1 variable deceleration, patient sitting straight up . moderate in degree variability.   Tocometer: contractions q 1-2 minutes Cervix: 1/30-50/-3 with funneling. Mucoid discharge, but no obvious signs of rupture.  Extremities: Nontender, no edema.  Pitocin: None  Labs:  Results for orders placed or performed during the hospital encounter of 09/28/22  CBC  Result Value Ref Range   WBC 7.2 4.0 - 10.5 K/uL   RBC 4.26 3.87 - 5.11 MIL/uL   Hemoglobin 10.9 (L) 12.0 - 15.0 g/dL   HCT 16.1 (L) 09.6 - 04.5 %   MCV 80.3 80.0 - 100.0 fL   MCH 25.6 (L) 26.0 - 34.0 pg   MCHC 31.9 30.0 - 36.0 g/dL   RDW 40.9 81.1 - 91.4 %   Platelets 278 150 - 400 K/uL   nRBC 0.0 0.0 - 0.2 %  OB RESULTS CONSOLE Varicella zoster antibody, IgG  Result Value Ref Range   Varicella Immune   Comprehensive metabolic panel  Result Value Ref Range   Sodium 136 135 - 145 mmol/L   Potassium 3.8 3.5 - 5.1 mmol/L   Chloride 107 98 - 111 mmol/L   CO2 21 (L) 22 - 32 mmol/L   Glucose, Bld 94 70 - 99 mg/dL   BUN 9 6 - 20 mg/dL   Creatinine, Ser 7.82 0.44 - 1.00 mg/dL   Calcium 8.7 (L) 8.9 - 10.3 mg/dL   Total Protein 7.4 6.5 - 8.1 g/dL   Albumin 3.1 (L) 3.5 - 5.0 g/dL   AST 20 15 - 41 U/L   ALT 13 0 - 44 U/L   Alkaline Phosphatase 122 38 - 126 U/L   Total Bilirubin 0.7 0.3 - 1.2 mg/dL   GFR, Estimated >95 >62 mL/min   Anion gap 8 5 - 15  Protein / creatinine ratio, urine  Result Value Ref Range   Creatinine, Urine 228 mg/dL   Total Protein, Urine 98 mg/dL   Protein Creatinine Ratio 0.43 (H)  0.00 - 0.15 mg/mg[Cre]  Type and screen  Result Value Ref Range   ABO/RH(D) O POS    Antibody Screen NEG    Sample Expiration      10/01/2022,2359 Performed at [facility], [address] [place], [place] [address]      Assessment:  1: SIUP at [redacted]w[redacted]d 2. cHTN? With new onset pre-eclampsia.  3. Morbid obesity (BMI >60)  Plan:  1. Continue with IOL. Is s/p second dose of Cytotec at 1608.   2. Pain medication as desired for pain. Will desire epidural  3. Continue to monitor BPs. New onset of pre-eclampsia, without severe features. Will treat severe range BPs if they occur.    Hildred Laser, MD 09/28/2022 6:57 PM

## 2022-09-28 NOTE — Progress Notes (Signed)
Patient repetitive late and variable decelerations also fetal tachycardia to 180s.  Pitocin at 2 mIU, held. IUPC and FSE placed. Left lateral and right lateral positions attempted. Also placed in mild trendelenberg position. Amnioinfusion started.  Temp rechecked as patient's external temp felt warm.  Temp 99.1.  Suspicious for progressing to chorioamnionitis.  Received 1st dose of PCN for GBS prophylaxis, will changed antibiotics to Ampicillin and will add dose of Gentamicin.  Dose of terbutaline given as contractions q 1-1.5 minute. If tracing does not improve, may need to proceed to Cesarean delivery. Cervix stretchy to 4 /90/-2.

## 2022-09-28 NOTE — Progress Notes (Signed)
.  Intrapartum Progress Note  S: Patient comfortable after epidural. Has SROM'd.   O: Blood pressure 128/60, pulse 79, temperature 98.7 F (37.1 C), temperature source Oral, resp. rate 20, height 5\' 3"  (1.6 m), weight (!) 157 kg, last menstrual period 12/19/2021, SpO2 99 %. Gen App: NAD, uncomfortable with contractions Abdomen: soft, gravid FHT: baseline 180 bpm.  Accels present.  Decels present - late decelerations after epidural placement. Moderate in degree variability.   Tocometer: contractions q 1-2 minutes Cervix: 1/30-50/-3 with funneling. Mucoid discharge, but no obvious signs of rupture.  Extremities: Nontender, no edema.  Pitocin: None  Labs:  Lab Results  Component Value Date   WBC 8.7 09/28/2022   HGB 11.0 (L) 09/28/2022   HCT 33.8 (L) 09/28/2022   MCV 79.5 (L) 09/28/2022   PLT 257 09/28/2022     Assessment:  1: SIUP at [redacted]w[redacted]d 2. Category II fetal tracing. 3. Morbid obesity (BMI >60) 4. cHTN? With new onset pre-eclampsia Plan:  1.  Hypotension after epidural, BPs treated with ephedrine and IVF bolus.  2. Continue IOL once fetal tracing more reassuring with Pitocin.  3. Continue to monitor BPs. New onset of pre-eclampsia, without severe features. Will treat severe range BPs if they occur.    Hildred Laser, MD 09/28/2022 10:44 PM

## 2022-09-29 ENCOUNTER — Encounter
Admission: EM | Disposition: A | Discharge status: Home / Self Care | Payer: Self-pay | Source: Home / Self Care | Attending: Obstetrics and Gynecology

## 2022-09-29 ENCOUNTER — Encounter: Payer: Self-pay | Admitting: Obstetrics and Gynecology

## 2022-09-29 DIAGNOSIS — O99214 Obesity complicating childbirth: Principal | ICD-10-CM

## 2022-09-29 DIAGNOSIS — D62 Acute posthemorrhagic anemia: Secondary | ICD-10-CM

## 2022-09-29 DIAGNOSIS — O411230 Chorioamnionitis, third trimester, not applicable or unspecified: Secondary | ICD-10-CM

## 2022-09-29 DIAGNOSIS — Z3A38 38 weeks gestation of pregnancy: Secondary | ICD-10-CM

## 2022-09-29 DIAGNOSIS — O114 Pre-existing hypertension with pre-eclampsia, complicating childbirth: Secondary | ICD-10-CM

## 2022-09-29 DIAGNOSIS — O9903 Anemia complicating the puerperium: Secondary | ICD-10-CM

## 2022-09-29 DIAGNOSIS — O99820 Streptococcus B carrier state complicating pregnancy: Secondary | ICD-10-CM

## 2022-09-29 LAB — CBC
HCT: 30.5 % — ABNORMAL LOW (ref 36.0–46.0)
Hemoglobin: 10 g/dL — ABNORMAL LOW (ref 12.0–15.0)
MCH: 25.8 pg — ABNORMAL LOW (ref 26.0–34.0)
MCHC: 32.8 g/dL (ref 30.0–36.0)
MCV: 78.8 fL — ABNORMAL LOW (ref 80.0–100.0)
Platelets: 254 10*3/uL (ref 150–400)
RBC: 3.87 MIL/uL (ref 3.87–5.11)
RDW: 13.9 % (ref 11.5–15.5)
WBC: 9.5 10*3/uL (ref 4.0–10.5)
nRBC: 0 % (ref 0.0–0.2)

## 2022-09-29 LAB — RPR: RPR Ser Ql: NONREACTIVE

## 2022-09-29 SURGERY — Surgical Case
Anesthesia: Epidural

## 2022-09-29 MED ORDER — LIDOCAINE HCL (PF) 1 % IJ SOLN
INTRAMUSCULAR | Status: DC | PRN
  Administered 2022-09-29: 30 mL

## 2022-09-29 MED ORDER — IBUPROFEN 600 MG PO TABS
600.0000 mg | ORAL_TABLET | Freq: Four times a day (QID) | ORAL | Status: DC
  Administered 2022-09-30 – 2022-10-01 (×4): 600 mg via ORAL
  Filled 2022-09-29 (×4): qty 1

## 2022-09-29 MED ORDER — CEFAZOLIN IN SODIUM CHLORIDE 3-0.9 GM/100ML-% IV SOLN
3.0000 g | INTRAVENOUS | Status: AC
  Administered 2022-09-29: 3 g via INTRAVENOUS
  Filled 2022-09-29: qty 100

## 2022-09-29 MED ORDER — ONDANSETRON HCL 4 MG/2ML IJ SOLN
INTRAMUSCULAR | Status: AC
  Filled 2022-09-29: qty 2

## 2022-09-29 MED ORDER — BUPIVACAINE LIPOSOME 1.3 % IJ SUSP
INTRAMUSCULAR | Status: AC
  Filled 2022-09-29: qty 20

## 2022-09-29 MED ORDER — SODIUM CHLORIDE 0.9 % IV SOLN
INTRAVENOUS | Status: AC
  Filled 2022-09-29: qty 5

## 2022-09-29 MED ORDER — NIFEDIPINE 10 MG PO CAPS
ORAL_CAPSULE | ORAL | Status: AC
  Filled 2022-09-29: qty 1

## 2022-09-29 MED ORDER — PHENYLEPHRINE HCL-NACL 20-0.9 MG/250ML-% IV SOLN
INTRAVENOUS | Status: AC
  Filled 2022-09-29: qty 250

## 2022-09-29 MED ORDER — NIFEDIPINE ER OSMOTIC RELEASE 30 MG PO TB24
30.0000 mg | ORAL_TABLET | Freq: Every day | ORAL | Status: DC
  Administered 2022-09-29 – 2022-10-01 (×3): 30 mg via ORAL
  Filled 2022-09-29 (×3): qty 1

## 2022-09-29 MED ORDER — OXYTOCIN-SODIUM CHLORIDE 30-0.9 UT/500ML-% IV SOLN: 2.5000 [IU]/h | INTRAVENOUS | Status: AC

## 2022-09-29 MED ORDER — COCONUT OIL OIL
1.0000 | TOPICAL_OIL | Status: DC | PRN
  Filled 2022-09-29: qty 15

## 2022-09-29 MED ORDER — FENTANYL CITRATE (PF) 100 MCG/2ML IJ SOLN
INTRAMUSCULAR | Status: DC | PRN
  Administered 2022-09-29: 15 ug via INTRATHECAL

## 2022-09-29 MED ORDER — SENNOSIDES-DOCUSATE SODIUM 8.6-50 MG PO TABS
2.0000 | ORAL_TABLET | Freq: Every day | ORAL | Status: DC
  Administered 2022-09-30 – 2022-10-01 (×2): 2 via ORAL
  Filled 2022-09-29 (×2): qty 2

## 2022-09-29 MED ORDER — ONDANSETRON HCL 4 MG/2ML IJ SOLN
INTRAMUSCULAR | Status: DC | PRN
  Administered 2022-09-29: 4 mg via INTRAVENOUS

## 2022-09-29 MED ORDER — SIMETHICONE 80 MG PO CHEW: 80.0000 mg | CHEWABLE_TABLET | ORAL | Status: DC | PRN

## 2022-09-29 MED ORDER — MENTHOL 3 MG MT LOZG: 1.0000 | LOZENGE | OROMUCOSAL | Status: DC | PRN

## 2022-09-29 MED ORDER — ENOXAPARIN SODIUM 40 MG/0.4ML IJ SOSY
40.0000 mg | PREFILLED_SYRINGE | INTRAMUSCULAR | Status: DC
  Administered 2022-09-30 – 2022-10-01 (×2): 40 mg via SUBCUTANEOUS
  Filled 2022-09-29 (×3): qty 0.4

## 2022-09-29 MED ORDER — LIDOCAINE HCL (PF) 2 % IJ SOLN
INTRAMUSCULAR | Status: AC
  Filled 2022-09-29: qty 5

## 2022-09-29 MED ORDER — TERBUTALINE SULFATE 1 MG/ML IJ SOLN
0.2500 mg | Freq: Once | INTRAMUSCULAR | Status: AC
  Administered 2022-09-29: 0.25 mg via SUBCUTANEOUS

## 2022-09-29 MED ORDER — LIDOCAINE HCL (PF) 2 % IJ SOLN
INTRAMUSCULAR | Status: AC
  Filled 2022-09-29: qty 10

## 2022-09-29 MED ORDER — GABAPENTIN 100 MG PO CAPS
100.0000 mg | ORAL_CAPSULE | Freq: Three times a day (TID) | ORAL | Status: DC
  Administered 2022-09-29 – 2022-10-01 (×6): 100 mg via ORAL
  Filled 2022-09-29 (×7): qty 1

## 2022-09-29 MED ORDER — PRENATAL MULTIVITAMIN CH
1.0000 | ORAL_TABLET | Freq: Every day | ORAL | Status: DC
  Administered 2022-09-30 – 2022-10-01 (×2): 1 via ORAL
  Filled 2022-09-29 (×2): qty 1

## 2022-09-29 MED ORDER — CHLORHEXIDINE GLUCONATE 0.12 % MT SOLN
OROMUCOSAL | Status: AC
  Administered 2022-09-29: 15 mL
  Filled 2022-09-29: qty 15

## 2022-09-29 MED ORDER — OXYTOCIN-SODIUM CHLORIDE 30-0.9 UT/500ML-% IV SOLN
INTRAVENOUS | Status: AC
  Filled 2022-09-29: qty 500

## 2022-09-29 MED ORDER — WITCH HAZEL-GLYCERIN EX PADS: 1.0000 | MEDICATED_PAD | CUTANEOUS | Status: DC | PRN

## 2022-09-29 MED ORDER — TERBUTALINE SULFATE 1 MG/ML IJ SOLN
INTRAMUSCULAR | Status: AC
  Filled 2022-09-29: qty 1

## 2022-09-29 MED ORDER — FERROUS SULFATE 325 (65 FE) MG PO TABS
325.0000 mg | ORAL_TABLET | ORAL | Status: DC
  Administered 2022-10-01: 325 mg via ORAL
  Filled 2022-09-29: qty 1

## 2022-09-29 MED ORDER — DIPHENHYDRAMINE HCL 25 MG PO CAPS: 25.0000 mg | ORAL_CAPSULE | Freq: Four times a day (QID) | ORAL | Status: DC | PRN

## 2022-09-29 MED ORDER — SODIUM CHLORIDE 0.9 % IV SOLN: 500.0000 mg | INTRAVENOUS | Status: DC

## 2022-09-29 MED ORDER — BUPIVACAINE LIPOSOME 1.3 % IJ SUSP: Freq: Once | INTRAMUSCULAR | Status: DC

## 2022-09-29 MED ORDER — ACETAMINOPHEN 500 MG PO TABS
1000.0000 mg | ORAL_TABLET | Freq: Four times a day (QID) | ORAL | Status: DC | PRN
  Administered 2022-09-29: 1000 mg via ORAL
  Filled 2022-09-29: qty 2

## 2022-09-29 MED ORDER — FENTANYL CITRATE (PF) 100 MCG/2ML IJ SOLN
INTRAMUSCULAR | Status: AC
  Filled 2022-09-29: qty 2

## 2022-09-29 MED ORDER — BUPIVACAINE IN DEXTROSE 0.75-8.25 % IT SOLN
INTRATHECAL | Status: DC | PRN
  Administered 2022-09-29: 1.5 mL via INTRATHECAL

## 2022-09-29 MED ORDER — SOD CITRATE-CITRIC ACID 500-334 MG/5ML PO SOLN
30.0000 mL | ORAL | Status: AC
  Administered 2022-09-29: 30 mL via ORAL

## 2022-09-29 MED ORDER — DEXMEDETOMIDINE HCL IN NACL 200 MCG/50ML IV SOLN
INTRAVENOUS | Status: DC | PRN
  Administered 2022-09-29: 8 ug via INTRAVENOUS

## 2022-09-29 MED ORDER — ZOLPIDEM TARTRATE 5 MG PO TABS: 5.0000 mg | ORAL_TABLET | Freq: Every evening | ORAL | Status: DC | PRN

## 2022-09-29 MED ORDER — SIMETHICONE 80 MG PO CHEW
80.0000 mg | CHEWABLE_TABLET | Freq: Three times a day (TID) | ORAL | Status: DC
  Administered 2022-09-29 – 2022-10-01 (×5): 80 mg via ORAL
  Filled 2022-09-29 (×5): qty 1

## 2022-09-29 MED ORDER — ONDANSETRON HCL 4 MG/2ML IJ SOLN
INTRAMUSCULAR | Status: DC | PRN
  Administered 2022-09-29: 4 mg via INTRAMUSCULAR

## 2022-09-29 MED ORDER — NYSTATIN 100000 UNIT/GM EX POWD
CUTANEOUS | Status: DC | PRN
  Filled 2022-09-29: qty 15

## 2022-09-29 MED ORDER — OXYTOCIN-SODIUM CHLORIDE 30-0.9 UT/500ML-% IV SOLN
INTRAVENOUS | Status: DC | PRN
  Administered 2022-09-29: 300 mL via INTRAVENOUS

## 2022-09-29 MED ORDER — FENTANYL CITRATE (PF) 100 MCG/2ML IJ SOLN
INTRAMUSCULAR | Status: DC | PRN
  Administered 2022-09-29: 50 ug via INTRAVENOUS

## 2022-09-29 MED ORDER — LIDOCAINE 5 % EX PTCH
MEDICATED_PATCH | CUTANEOUS | Status: AC
  Filled 2022-09-29: qty 1

## 2022-09-29 MED ORDER — CLINDAMYCIN PHOSPHATE 900 MG/50ML IV SOLN
900.0000 mg | Freq: Three times a day (TID) | INTRAVENOUS | Status: DC
  Administered 2022-09-29 – 2022-09-30 (×3): 900 mg via INTRAVENOUS
  Filled 2022-09-29 (×4): qty 50

## 2022-09-29 MED ORDER — SOD CITRATE-CITRIC ACID 500-334 MG/5ML PO SOLN
ORAL | Status: AC
  Filled 2022-09-29: qty 15

## 2022-09-29 MED ORDER — BUPIVACAINE LIPOSOME 1.3 % IJ SUSP
INTRAMUSCULAR | Status: DC | PRN
  Administered 2022-09-29: 20 mL

## 2022-09-29 MED ORDER — DIBUCAINE (PERIANAL) 1 % EX OINT: 1.0000 | TOPICAL_OINTMENT | CUTANEOUS | Status: DC | PRN

## 2022-09-29 MED ORDER — NIFEDIPINE 10 MG PO CAPS
10.0000 mg | ORAL_CAPSULE | Freq: Three times a day (TID) | ORAL | Status: DC
  Administered 2022-09-29: 10 mg via ORAL

## 2022-09-29 MED ORDER — KETOROLAC TROMETHAMINE 30 MG/ML IJ SOLN
30.0000 mg | Freq: Four times a day (QID) | INTRAMUSCULAR | Status: AC
  Administered 2022-09-29 – 2022-09-30 (×4): 30 mg via INTRAVENOUS
  Filled 2022-09-29 (×4): qty 1

## 2022-09-29 MED ORDER — TRAMADOL HCL 50 MG PO TABS: 50.0000 mg | ORAL_TABLET | Freq: Four times a day (QID) | ORAL | Status: DC | PRN

## 2022-09-29 MED ORDER — OXYCODONE-ACETAMINOPHEN 5-325 MG PO TABS
2.0000 | ORAL_TABLET | ORAL | Status: DC | PRN
  Administered 2022-09-30 (×3): 2 via ORAL
  Filled 2022-09-29 (×3): qty 2

## 2022-09-29 SURGICAL SUPPLY — 28 items
BAG COUNTER SPONGE SURGICOUNT (BAG) ×1 IMPLANT
CHLORAPREP W/TINT 26 (MISCELLANEOUS) ×2 IMPLANT
DRSG TELFA 3X8 NADH STRL (GAUZE/BANDAGES/DRESSINGS) ×1 IMPLANT
ELECT REM PT RETURN 9FT ADLT (ELECTROSURGICAL) ×1
ELECTRODE REM PT RTRN 9FT ADLT (ELECTROSURGICAL) ×1 IMPLANT
EXTRT SYSTEM ALEXIS 17CM (MISCELLANEOUS) ×1
GAUZE SPONGE 4X4 12PLY STRL (GAUZE/BANDAGES/DRESSINGS) ×1 IMPLANT
GLOVE BIO SURGEON STRL SZ 6.5 (GLOVE) ×1 IMPLANT
GLOVE INDICATOR 7.0 STRL GRN (GLOVE) ×1 IMPLANT
GOWN STRL REUS W/ TWL LRG LVL3 (GOWN DISPOSABLE) ×2 IMPLANT
GOWN STRL REUS W/TWL LRG LVL3 (GOWN DISPOSABLE) ×2
KIT TURNOVER KIT A (KITS) ×1 IMPLANT
MANIFOLD NEPTUNE II (INSTRUMENTS) ×1 IMPLANT
MAT PREVALON FULL STRYKER (MISCELLANEOUS) ×1 IMPLANT
NS IRRIG 1000ML POUR BTL (IV SOLUTION) ×1 IMPLANT
PACK C SECTION AR (MISCELLANEOUS) ×1 IMPLANT
PAD OB MATERNITY 4.3X12.25 (PERSONAL CARE ITEMS) ×1 IMPLANT
PAD PREP OB/GYN DISP 24X41 (PERSONAL CARE ITEMS) ×1 IMPLANT
RETRACTOR TRAXI PANNICULUS (MISCELLANEOUS) IMPLANT
SCRUB CHG 4% DYNA-HEX 4OZ (MISCELLANEOUS) ×1 IMPLANT
SUT MNCRL AB 4-0 PS2 18 (SUTURE) ×1 IMPLANT
SUT PLAIN 2 0 XLH (SUTURE) IMPLANT
SUT VIC AB 0 CT1 36 (SUTURE) ×4 IMPLANT
SUT VIC AB 3-0 SH 27 (SUTURE) ×1
SUT VIC AB 3-0 SH 27X BRD (SUTURE) ×1 IMPLANT
SYSTEM CONTND EXTRCTN KII BLLN (MISCELLANEOUS) IMPLANT
TRAP FLUID SMOKE EVACUATOR (MISCELLANEOUS) ×1 IMPLANT
WATER STERILE IRR 500ML POUR (IV SOLUTION) ×1 IMPLANT

## 2022-09-29 NOTE — Transfer of Care (Signed)
  Anesthesia Post-op Note  Patient: Katherine Huynh  Procedure(s) Performed: Procedure(s): CESAREAN SECTION  Patient Location: Mother/Baby  Anesthesia Type:Spinal  Level of Consciousness: awake, alert  and oriented  Airway and Oxygen Therapy: Patient Spontanous Breathing  Post-op Pain: none  Post-op Assessment: Post-op Vital signs reviewed  Post-op Vital Signs: Reviewed and stable  Last Vitals:  Vitals:   09/29/22 0735 09/29/22 1046  BP:  129/84  Pulse:  80  Resp:  18  Temp:    SpO2: 95% 97%    Complications: No apparent anesthesia complications

## 2022-09-29 NOTE — Lactation Note (Addendum)
This note was copied from a baby's chart. Lactation Consultation Note  Patient Name: Katherine Huynh [id number] Date: 09/29/2022 Age:27 hours Reason for consult: Follow-up assessment;Primapara   Maternal Data Has patient been taught Hand Expression?: Yes Does the patient have breastfeeding experience prior to this delivery?: No  Feeding Mother's Current Feeding Choice: Breast Milk Assisted mom with latching baby to left breast in football hold after hand expression of drops of colostrum, baby able to latch with breast support and sandwiching of breast tissue, nursed 20 min with some swallowing  LATCH Score Latch: Grasps breast easily, tongue down, lips flanged, rhythmical sucking.  Audible Swallowing: A few with stimulation  Type of Nipple: Everted at rest and after stimulation (sl flat nipples that are compressible)  Comfort (Breast/Nipple): Soft / non-tender  Hold (Positioning): Assistance needed to correctly position infant at breast and maintain latch.  LATCH Score: 8   Lactation Tools Discussed/Used   LC name and no written on white board Interventions Interventions: Assisted with latch;Skin to skin;Hand express;Breast compression;Adjust position;Support pillows;Education  Discharge Pump: Declined (contact [facility] for pump loan if needed) WIC Program: Yes  Consult Status Consult Status: PRN    Dyann Kief 09/29/2022, 3:38 PM

## 2022-09-29 NOTE — Op Note (Signed)
Cesarean Section Procedure Note  Indications: chorioamnionitis and non-reassuring fetal status  Pre-operative Diagnosis: 38 week 2 day pregnancy, pre-eclampsia without severe features, non-reassuring fetal tracing, chorioamnionitis, morbid obesity (BMI 61).  Post-operative Diagnosis: Same  Surgeon: Hildred Laser, MD  Assistants:  Hartley Barefoot, CNM  Procedure: Primary low transverse Cesarean Section  Anesthesia: Spinal anesthesia  Findings: Female infant, cephalic presentation, 3210 grams, with Apgar scores of 8 at one minute and 9 at five minutes. Intact placenta with 3 vessel cord.  Moderate meconium stained amniotic fluid at amniotomy The uterine outline, tubes and ovaries appeared normal.   Procedure Details: The patient was seen in the Holding Room. The risks, benefits, complications, treatment options, and expected outcomes were discussed with the patient.  The patient concurred with the proposed plan, giving informed consent.  The site of surgery properly noted/marked. The patient was taken to the Operating Room, identified as Catalena Remlinger and the procedure verified as C-Section Delivery. A Time Out was held and the above information confirmed.  After induction of anesthesia, the patient was draped and prepped in the usual sterile manner. Anesthesia was tested and noted to be adequate. A Pfannenstiel incision was made and carried down through the subcutaneous tissue to the fascia. Fascial incision was made and extended transversely. The fascia was separated from the underlying rectus tissue superiorly and inferiorly. The peritoneum was identified and entered. Peritoneal incision was extended longitudinally. The surgical assist was able to provide retraction to allow for clear visualization of surgical site. An Alexis retractor was placed in the abdomen for additional retraction. The utero-vesical peritoneal reflection was incised transversely and the bladder flap was bluntly freed  from the lower uterine segment. A low transverse uterine incision was made. Delivered from cephalic presentation was a 3210 gram Female with Apgar scores of 8 at one minute and 9 at five minutes.  The assistant was able to apply adequate fundal pressure to allow for successful delivery of the fetus. After the umbilical cord was clamped and cut, cord blood was obtained for evaluation. Delayed cord clamping was observed. The placenta was removed intact and appeared normal. The uterus was unable to be exteriorized due to patient discomfort, but was cleared of all clots and debris. The uterine outline, tubes and ovaries were able to be visualized intracorporally and appeared normal.  The uterine incision was closed with running locked sutures of 0-Vicryl.  A second suture of 0-Vicryl was used in an imbricating layer.  Hemostasis was observed. The uterus was then returned to the abdomen. The pericolic gutters were cleared of all clots and debris.  The fascia was then grasped with Zannie Cove and Tresa Endo clamps, and injected with a total of 50 ml of 1.3% Exparel solution (20 ml of bupivicaine liposomal mixed with 20 ml of1% Lidocaine and diluted in 30 ml of normal saline).  The fascia was then reapproximated with a running suture of 0-Vicryl. The subcutaneous fat layer was reapproximated with 2-0 Vicryl. The skin was reapproximated with 4-0 Monocryl. The skin and subcutaneous tissues were then injected with an additional 20 ml of the Exparel solution. The incision was covered with steri-strips and a pressure dressing.   Instrument, sponge, and needle counts were correct prior the abdominal closure and at the conclusion of the case.    An experienced assistant was required given the standard of surgical care given the complexity of the case.  This assistant was needed for exposure, dissection, suctioning, retraction, instrument exchange, and for overall help during the procedure.  Estimated Blood Loss:  750 ml       Drains: foley catheter to gravity drainage, 200 ml of clear urine at end of the procedure         Total IV Fluids:  600 ml  Specimens: Cord blood sent for evaluation         Implants: None         Complications:  None; patient tolerated the procedure well.         Disposition: PACU - hemodynamically stable.         Condition: stable   Hildred Laser, MD [facility] at [facility]

## 2022-09-29 NOTE — Anesthesia Procedure Notes (Addendum)
Spinal  Patient location during procedure: OR Start time: 09/29/2022 8:54 AM End time: 09/29/2022 9:12 AM Reason for block: surgical anesthesia Staffing Performed: anesthesiologist  Anesthesiologist: Stephanie Coup, MD Resident/CRNA: Karoline Caldwell, CRNA Performed by: Karoline Caldwell, CRNA Authorized by: Stephanie Coup, MD   Preanesthetic Checklist Completed: patient identified, IV checked, site marked, risks and benefits discussed, surgical consent, monitors and equipment checked, pre-op evaluation and timeout performed Spinal Block Patient position: sitting Prep: ChloraPrep Patient monitoring: heart rate, continuous pulse ox, blood pressure and cardiac monitor Approach: midline Location: L3-4 Injection technique: single-shot Needle Needle type: Whitacre and Introducer  Needle gauge: 22 G Needle length: 9 cm Assessment Sensory level: T4 Events: CSF return Additional Notes Sterile aseptic technique used throughout the procedure.  Negative paresthesia. Negative blood return. Positive free-flowing CSF. Expiration date of kit checked and confirmed. Patient tolerated procedure well, without complications.

## 2022-09-29 NOTE — Lactation Note (Signed)
This note was copied from a baby's chart. Lactation Consultation Note  Patient Name: Katherine Huynh [id number] Date: 09/29/2022 Age:27 hours Reason for consult: L&D Initial assessment;Primapara;1st time breastfeeding   Maternal Data Does the patient have breastfeeding experience prior to this delivery?: No  Feeding Mother's Current Feeding Choice: Breast Milk BAby nursing on right breast in football hold when room entered, baby actively sucking, no stimulation needed to continue to nurse, mom reports she has been leaking colostrum since 7 mths      LATCH Score Latch: Grasps breast easily, tongue down, lips flanged, rhythmical sucking.  Audible Swallowing: None  Type of Nipple: Everted at rest and after stimulation  Comfort (Breast/Nipple): Soft / non-tender  Hold (Positioning): No assistance needed to correctly position infant at breast.  LATCH Score: 8   Lactation Tools Discussed/Used    Interventions Interventions: Breast feeding basics reviewed;Assisted with latch;Adjust position;Breast compression;Support pillows;Position options;Education  Discharge    Consult Status Consult Status: Follow-up from L&D    Dyann Kief 09/29/2022, 2:02 PM

## 2022-09-29 NOTE — Progress Notes (Signed)
.  Intrapartum Progress Note  S: Patient without major complaints.   O: Blood pressure 129/66, pulse (!) 102, temperature 99 F (37.2 C), temperature source Oral, resp. rate 18, height 5\' 3"  (1.6 m), weight (!) 157 kg, last menstrual period 12/19/2021, SpO2 95 %. Gen App: NAD, comfortable Abdomen: soft, gravid FHT: baseline 160 bpm.  Accels present.  Decels present - late and variable decelerations after cervical check.  Moderate variability with periods of minimal variability.    Tocometer: contractions q 1-2 minutes Cervix: 3.5-4/90/-2. Moderate meconium fluid noted with exam.  Extremities: Nontender, no edema.  Pitocin: None  Labs:  Lab Results  Component Value Date   WBC 8.7 09/28/2022   HGB 11.0 (L) 09/28/2022   HCT 33.8 (L) 09/28/2022   MCV 79.5 (L) 09/28/2022   PLT 257 09/28/2022     Assessment:  1: SIUP at [redacted]w[redacted]d 2. Category II fetal tracing. 3. Morbid obesity (BMI >60) 4. cHTN? With new onset pre-eclampsia without severe features  Plan:  1.  Currently receiving amnioinfusion.  Category II tracing again, now with meconium.  Discussed inability to continue to move forward with induction at this time. Would recommend C-section at this time.  The risks of surgery were discussed with the patient including but were not limited to: bleeding which may require transfusion or reoperation; infection which may require antibiotics; injury to bowel, bladder, ureters or other surrounding organs; injury to the fetus; need for additional procedures including hysterectomy in the event of a life-threatening hemorrhage; formation of adhesions; placental abnormalities with subsequent pregnancies; incisional problems; thromboembolic phenomenon and other postoperative/anesthesia complications.  The patient concurred with the proposed plan, giving informed written consent for the procedure.   Patient has been NPO except clears since ~ 8 PM yesterday, she will remain NPO for procedure. Anesthesia and  OR aware. Preoperative prophylactic antibiotics and SCDs ordered on call to the OR.  To OR when ready. 2. Continue to monitor BPs. New onset of pre-eclampsia, without severe features. No severe range BPs at this time.    Hildred Laser, MD 09/29/2022 7:44 AM

## 2022-09-30 ENCOUNTER — Encounter: Payer: Self-pay | Admitting: Obstetrics and Gynecology

## 2022-09-30 DIAGNOSIS — O119 Pre-existing hypertension with pre-eclampsia, unspecified trimester: Secondary | ICD-10-CM | POA: Diagnosis present

## 2022-09-30 HISTORY — DX: Pre-existing hypertension with pre-eclampsia, unspecified trimester: O11.9

## 2022-09-30 LAB — CBC
HCT: 23.5 % — ABNORMAL LOW (ref 36.0–46.0)
Hemoglobin: 7.6 g/dL — ABNORMAL LOW (ref 12.0–15.0)
MCH: 26.2 pg (ref 26.0–34.0)
MCHC: 32.3 g/dL (ref 30.0–36.0)
MCV: 81 fL (ref 80.0–100.0)
Platelets: 204 10*3/uL (ref 150–400)
RBC: 2.9 MIL/uL — ABNORMAL LOW (ref 3.87–5.11)
RDW: 14 % (ref 11.5–15.5)
WBC: 9.8 10*3/uL (ref 4.0–10.5)
nRBC: 0 % (ref 0.0–0.2)

## 2022-09-30 MED ORDER — SODIUM CHLORIDE 0.9 % IV SOLN
250.0000 mg | Freq: Once | INTRAVENOUS | Status: AC
  Administered 2022-09-30: 250 mg via INTRAVENOUS
  Filled 2022-09-30: qty 20

## 2022-09-30 MED ORDER — SODIUM CHLORIDE 0.9 % IV SOLN: INTRAVENOUS | Status: DC | PRN

## 2022-09-30 NOTE — Anesthesia Post-op Follow-up Note (Signed)
  Anesthesia Pain Follow-up Note  Patient: Gretchan Gara  Day #: 1  Date of Follow-up: 09/30/2022 Time: 3:21 PM  Last Vitals:  Vitals:   09/30/22 0743 09/30/22 1256  BP: 136/89 (!) 135/96  Pulse: 96 (!) 115  Resp: 16 18  Temp: 37.1 C 37.1 C  SpO2:  99%    Level of Consciousness: alert  Pain: mild   Side Effects:None  Catheter Site Exam:clean, dry  Anti-Coag Meds (From admission, onward)    Start     Dose/Rate Route Frequency Ordered Stop   09/30/22 0800  enoxaparin (LOVENOX) injection 40 mg        40 mg Subcutaneous Every 24 hours 09/29/22 1141          Plan: D/C from anesthesia care at surgeon's request  Lenard Simmer

## 2022-09-30 NOTE — Progress Notes (Signed)
Katherine Huynh   CMW notified of increased B/P   No new orders received  No complaints of pt having headache or blurred vision

## 2022-09-30 NOTE — Progress Notes (Signed)
Postpartum Day # 1: Cesarean Delivery.  Chorioamnionitis. Pre-eclampsia without severe features.   Subjective: Patient reports tolerating PO, + flatus, and no problems voiding.  Is ambulating some. Working on breastfeeding. Notes bleeding is normal.   Objective: Vital signs in last 24 hours: Temp:  [98 F (36.7 C)-99.1 F (37.3 C)] 98.8 F (37.1 C) (05/19 0743) Pulse Rate:  [76-97] 96 (05/19 0743) Resp:  [16-29] 16 (05/19 0743) BP: (122-156)/(67-96) 136/89 (05/19 0743) SpO2:  [90 %-100 %] 97 % (05/19 0300)  Physical Exam:  General: alert, cooperative, and no distress Lungs: clear to auscultation bilaterally Breasts: normal appearance, no masses or tenderness Heart: regular rate and rhythm, S1, S2 normal, no murmur, click, rub or gallop Abdomen: soft, non-tender; bowel sounds normal; no masses,  no organomegaly Pelvis: Lochia appropriate, Uterine Fundus firm, Incision: healing well, bandage clean/dry/intact Extremities: DVT Evaluation: No evidence of DVT seen on physical exam. Negative Homan's sign. No cords or calf tenderness. No significant calf/ankle edema.  Recent Labs    09/29/22 0745 09/30/22 0543  HGB 10.0* 7.6*  HCT 30.5* 23.5*    Assessment/Plan: Status post Cesarean section. Doing well postoperatively.  Breastfeeding and Lactation consult Regular diet as tolerated Continue PO pain management Discussed iron infusion vs oral supplementation for asymptomatic anemia (anemia of pregnancy worsened with surgical blood loss). Patient ok with iron infusion.  Initiated on Procardia XL 30 mg daily postpartum for elevated BPs.  Can discontinue antibiotics this morning.  Continue current care. Plan for discharge tomorrow.   Hildred Laser, MD [facility] at [facility]

## 2022-09-30 NOTE — TOC Initial Note (Signed)
Transition of Care [facility]) - Initial/Assessment Note    Patient Details  Name: Katherine Huynh MRN: [id number] Date of Birth: 27-Aug-1995  Transition of Care [facility]) CM/SW Contact:    Carmina Miller, LCSWA Phone Number: 09/30/2022, 4:08 PM  Clinical Narrative:                  CSW attempted to reach out to pt concerning refusal of UDS. CSW attempted to reach pt via phone, no answer. CSW spoke with pt's RN, she states pt is in the shower. Weekday CSW will follow up with pt tomorrow prior to dc to discuss cord being sent off and if it comes back positive then a CPS report will be made. CSW unable to make a report at this time due to pt's UDS being negative and no concerns by staff of pt not being able to properly care for the baby.         Patient Goals and CMS Choice            Expected Discharge Plan and Services                                              Prior Living Arrangements/Services                       Activities of Daily Living Home Assistive Devices/Equipment: Eyeglasses ADL Screening (condition at time of admission) Patient's cognitive ability adequate to safely complete daily activities?: Yes Is the patient deaf or have difficulty hearing?: No Does the patient have difficulty seeing, even when wearing glasses/contacts?: No Does the patient have difficulty concentrating, remembering, or making decisions?: No Patient able to express need for assistance with ADLs?: Yes Does the patient have difficulty dressing or bathing?: No Independently performs ADLs?: Yes (appropriate for developmental age) Does the patient have difficulty walking or climbing stairs?: No Weakness of Legs: None Weakness of Arms/Hands: None  Permission Sought/Granted                  Emotional Assessment              Admission diagnosis:  Chronic hypertension with exacerbation during pregnancy in third trimester [O10.913] Patient Active Problem List    Diagnosis Date Noted   Chronic hypertension with superimposed pre-eclampsia 09/30/2022   Chronic hypertension with exacerbation during pregnancy in third trimester 09/28/2022   Group B Streptococcus carrier, +RV culture, currently pregnant 09/14/2022   Obesity in pregnancy, antepartum 07/18/2022   Heartburn in pregnancy in second trimester 05/12/2022   Supervision of high risk pregnancy, antepartum 02/13/2022   Primary hypertension 06/23/2020   Pure hypercholesterolemia 06/23/2020   Severe dysmenorrhea 05/30/2018   Moderate episode of recurrent major depressive disorder (HCC) 05/30/2018   Borderline personality disorder in adult (HCC) 01/15/2018   Murmur, cardiac 10/12/2016   Polycystic disease, ovaries 10/02/2016   Depression, major, recurrent (HCC) 02/17/2016   Morbid obesity with BMI of 60.0-69.9, adult (HCC) 02/17/2016   Difficulty controlling anger 02/17/2016   Athlete's foot, left 10/10/2015   Onychomycosis 09/15/2015   Fam hx-ischem heart disease 09/15/2015   PCP:  Patient, No Pcp Per Pharmacy:   [facility] [address]), [place] - [address] [place]) [place] [address] Phone: [phone] Fax: [phone]  [facility] - [place], [place] - [address]  [address] [place] [address] Phone: [phone] Fax: [phone]     Social Determinants of Health (SDOH) Social History: SDOH Screenings   Food Insecurity: No Food Insecurity (09/28/2022)  Housing: Low Risk  (09/28/2022)  Transportation Needs: No Transportation Needs (09/28/2022)  Utilities: Not At Risk (09/28/2022)  Alcohol Screen: Low Risk  (02/11/2019)  Depression (PHQ2-9): Low Risk  (05/17/2022)  Financial Resource Strain: Low Risk  (02/13/2022)  Physical Activity: Insufficiently Active (02/13/2022)  Social Connections: Moderately Isolated (02/13/2022)  Stress: No Stress Concern Present (02/13/2022)  Tobacco Use:  Medium Risk (09/30/2022)   SDOH Interventions:     Readmission Risk Interventions     No data to display

## 2022-09-30 NOTE — Discharge Summary (Signed)
Postpartum Discharge Summary  Date of Service updated 10/01/2022      Patient Name: Katherine Huynh DOB: 12/05/1995 MRN: [id number]  Date of admission: 09/28/2022 Delivery date:09/29/2022  Delivering provider: Hildred Laser  Date of discharge: 10/01/2022  Admitting diagnosis: Chronic hypertension with exacerbation during pregnancy in third trimester [O10.913] Intrauterine pregnancy: [redacted]w[redacted]d     Secondary diagnosis:  Principal Problem:   Chronic hypertension with exacerbation during pregnancy in third trimester Active Problems:   Morbid obesity with BMI of 60.0-69.9, adult (HCC)   Group B Streptococcus carrier, +RV culture, currently pregnant   Chronic hypertension with superimposed pre-eclampsia  Additional problems: N/A    Discharge diagnosis: Term Pregnancy Delivered and CHTN with superimposed preeclampsia                                              Post partum procedures: Iron infusion Augmentation: Pitocin and Cytotec Complications: Intrauterine Inflammation or infection (Chorioamniotis)  Hospital course: Induction of Labor With Cesarean Section   27 y.o. yo [id number] at [redacted]w[redacted]d was admitted to the hospital 09/28/2022 for induction of labor. Patient had a labor course significant for development of chorioamnionitis and non-reassuring fetal heart tracing with repetitive late and variable decelerations after SROM. The patient went for cesarean section due to Non-Reassuring FHR with chorioamnionitis. Delivery details are as follows: Membrane Rupture Time/Date: 4:52 PM ,09/28/2022   Delivery Method:C-Section, Low Transverse  Details of operation can be found in separate operative Note.  Patient had a postpartum course that was uncomplicated. She is ambulating, tolerating a regular diet, passing flatus, and urinating well.  Patient is discharged home in stable condition on 10/01/22.      Newborn Data: Birth date:09/29/2022  Birth time:9:51 AM  Gender:Female  Living status:Living  Apgars:8  ,9  Weight:3210 g                                Magnesium Sulfate received: No BMZ received: No Rhophylac:No MMR:No T-DaP:Given prenatally Flu: No Transfusion:No  Physical exam  Vitals:   10/01/22 0438 10/01/22 0802 10/01/22 0806 10/01/22 1127  BP: 127/82 (!) 154/102 120/86 (!) 138/93  Pulse: 83 95 87 91  Resp: 20 20  20   Temp: 98.1 F (36.7 C) 98 F (36.7 C)  98.4 F (36.9 C)  TempSrc: Oral Oral  Oral  SpO2: 99% 99%  100%  Weight:      Height:       General: alert, cooperative, and no distress Lochia: appropriate Uterine Fundus: firm Incision: Healing well with no significant drainage, No significant erythema, Dressing is clean, dry, and intact DVT Evaluation: No evidence of DVT seen on physical exam. Negative Homan's sign. No cords or calf tenderness. No significant calf/ankle edema.   Labs: Lab Results  Component Value Date   WBC 9.8 09/30/2022   HGB 7.6 (L) 09/30/2022   HCT 23.5 (L) 09/30/2022   MCV 81.0 09/30/2022   PLT 204 09/30/2022      Latest Ref Rng & Units 09/28/2022   10:06 AM  CMP  Glucose 70 - 99 mg/dL 94   BUN 6 - 20 mg/dL 9   Creatinine 3.08 - 6.57 mg/dL 8.46   Sodium 962 - 952 mmol/L 136   Potassium 3.5 - 5.1 mmol/L 3.8   Chloride 98 - 111 mmol/L 107  CO2 22 - 32 mmol/L 21   Calcium 8.9 - 10.3 mg/dL 8.7   Total Protein 6.5 - 8.1 g/dL 7.4   Total Bilirubin 0.3 - 1.2 mg/dL 0.7   Alkaline Phos 38 - 126 U/L 122   AST 15 - 41 U/L 20   ALT 0 - 44 U/L 13    Edinburgh Score:    09/29/2022   11:57 PM  Edinburgh Postnatal Depression Scale Screening Tool  I have been able to laugh and see the funny side of things. 0  I have looked forward with enjoyment to things. 0  I have blamed myself unnecessarily when things went wrong. 1  I have been anxious or worried for no good reason. 0  I have felt scared or panicky for no good reason. 0  Things have been getting on top of me. 1  I have been so unhappy that I have had difficulty sleeping. 0  I  have felt sad or miserable. 0  I have been so unhappy that I have been crying. 0  The thought of harming myself has occurred to me. 0  Edinburgh Postnatal Depression Scale Total 2      After visit meds:  Allergies as of 10/01/2022       Reactions   Cinnamon Itching   Orange (diagnostic) Hives   Bonjesta [doxylamine-pyridoxine] Hives, Itching   Erythromycin Rash   Sulfa Antibiotics Rash        Medication List     STOP taking these medications    aspirin EC 81 MG tablet   Doxylamine-Pyridoxine 10-10 MG Tbec   ondansetron 4 MG disintegrating tablet Commonly known as: ZOFRAN-ODT       TAKE these medications    diphenhydrAMINE 25 mg capsule Commonly known as: BENADRYL Take 1 capsule by mouth as needed.   fluticasone 220 MCG/ACT inhaler Commonly known as: FLOVENT HFA Inhale 1 puff into the lungs as needed.   ibuprofen 600 MG tablet Commonly known as: ADVIL Take 1 tablet (600 mg total) by mouth every 6 (six) hours.   NIFEdipine 30 MG 24 hr tablet Commonly known as: ADALAT CC Take 1 tablet (30 mg total) by mouth daily. Start taking on: Oct 02, 2022   oxyCODONE-acetaminophen 5-325 MG tablet Commonly known as: PERCOCET/ROXICET Take 2 tablets by mouth every 4 (four) hours as needed for severe pain ((when tolerating fluids)).   prenatal vitamin w/FE, FA 27-1 MG Tabs tablet Take 1 tablet by mouth daily at 12 noon.         Discharge home in stable condition Infant Feeding: Breast Infant Disposition:home with mother Discharge instruction: per After Visit Summary and Postpartum booklet. Activity: Advance as tolerated. Pelvic rest for 6 weeks.  Diet: routine diet Anticipated Birth Control: Unsure Postpartum Appointment:6 weeks Additional Postpartum F/U: Postpartum Depression checkup and Incision check 1 week and BP check Future Appointments: Future Appointments  Date Time Provider Department Center  10/10/2022  4:15 PM Mirna Mires, CNM [facility] None    Follow up Visit:  Follow-up Information     [facility] Follow up.   Specialty: Obstetrics and Gynecology Why: 10-14 day incision check and pp mood check in office with Dr. Valentino Saxon.  6 week postpartum visit Contact information: [address] [place] [address] [phone]                    10/01/2022 Doreene Burke, CNM

## 2022-09-30 NOTE — Anesthesia Postprocedure Evaluation (Signed)
Anesthesia Post Note  Patient: Katherine Huynh  Procedure(s) Performed: CESAREAN SECTION  Patient location during evaluation: Mother Baby Anesthesia Type: Epidural and Spinal Level of consciousness: oriented and awake and alert Pain management: pain level controlled Vital Signs Assessment: post-procedure vital signs reviewed and stable Respiratory status: spontaneous breathing and respiratory function stable Cardiovascular status: blood pressure returned to baseline and stable Postop Assessment: no headache, no backache, no apparent nausea or vomiting and able to ambulate Anesthetic complications: no   No notable events documented.   Last Vitals:  Vitals:   09/30/22 0743 09/30/22 1256  BP: 136/89 (!) 135/96  Pulse: 96 (!) 115  Resp: 16 18  Temp: 37.1 C 37.1 C  SpO2:  99%    Last Pain:  Vitals:   09/30/22 1340  TempSrc:   PainSc: Asleep                 Lenard Simmer

## 2022-10-01 MED ORDER — OXYCODONE-ACETAMINOPHEN 5-325 MG PO TABS: 2.0000 | ORAL_TABLET | ORAL | 0 refills | Status: DC | PRN

## 2022-10-01 MED ORDER — NIFEDIPINE ER 30 MG PO TB24: 30.0000 mg | ORAL_TABLET | Freq: Every day | ORAL | 5 refills | Status: DC

## 2022-10-01 MED ORDER — IBUPROFEN 600 MG PO TABS: 600.0000 mg | ORAL_TABLET | Freq: Four times a day (QID) | ORAL | 0 refills | Status: AC

## 2022-10-01 NOTE — Lactation Note (Signed)
This note was copied from a baby's chart. Lactation Consultation Note  Patient Name: Katherine Huynh [id number] Date: 10/01/2022 Age:27 hours Reason for consult: Follow-up assessment;Early term 37-38.6wks;Maternal discharge   Maternal Data Has patient been taught Hand Expression?: Yes Does the patient have breastfeeding experience prior to this delivery?: No  Feeding Mother's Current Feeding Choice: Breast Milk and Formula Mom had recently breastfed baby on right breast, she states that baby latches and nurses well on right but not so on left, she states baby will latch to left but pushes off after a few sucks, she is also supplementing baby with formula  LATCH Score                    Lactation Tools Discussed/Used  Recommended mom to call LC at next feeding for assistance with latching baby to left breast, may need to try another position for latching, recommended pre pump with Harmony pump as right breast is producing more and pre pumping may help give baby reward faster on left so that baby will continue to nurse.  Demonstration of Harmony pump done on left breast with colostrum easily obtained with just few  minutes of pumping., LC phone no is on white board for mom to call.   Interventions Interventions: Hand pump (Harmony pump given with instruction, mom has ordered an electric pump that she will receive by Wednesday this wk)  Discharge Discharge Education: Engorgement and breast care Pump: Manual WIC Program: Yes  Consult Status Consult Status: PRN    Dyann Kief 10/01/2022, 11:32 AM

## 2022-10-03 ENCOUNTER — Other Ambulatory Visit: Payer: Medicaid Other

## 2022-10-04 ENCOUNTER — Encounter: Payer: Self-pay | Admitting: Obstetrics and Gynecology

## 2022-10-04 ENCOUNTER — Ambulatory Visit (INDEPENDENT_AMBULATORY_CARE_PROVIDER_SITE_OTHER): Payer: Medicaid Other | Admitting: Obstetrics and Gynecology

## 2022-10-04 VITALS — BP 130/82 | HR 97 | Ht 63.0 in | Wt 320.6 lb

## 2022-10-04 DIAGNOSIS — O119 Pre-existing hypertension with pre-eclampsia, unspecified trimester: Secondary | ICD-10-CM

## 2022-10-04 DIAGNOSIS — Z98891 History of uterine scar from previous surgery: Secondary | ICD-10-CM

## 2022-10-04 DIAGNOSIS — Z4889 Encounter for other specified surgical aftercare: Secondary | ICD-10-CM

## 2022-10-04 NOTE — Progress Notes (Signed)
    OBSTETRICS/GYNECOLOGY POST-OPERATIVE CLINIC VISIT  Subjective:     Katherine Huynh is a 27 y.o. female who presents to the clinic 1 weeks status post  primary low-transverse C-section  for  non-reassuring fetal tracing.  Pregnancy was complicated by morbid obesity, and mild pre-eclampsia at term . Taking Porcardia 30 mg daily as prescribed. Eating a regular diet without difficulty. Bowel movements are normal. Pain is controlled with current analgesics. Medications being used: prescription NSAID's including ibuprofen (Motrin) and narcotic analgesics including oxycodone/acetaminophen (Percocet, Tylox). But uses pain medications sparingly.  Is doing well with breastfeeding. Bleeding is light, manageable. Notes her mood is well, has lots of family support.    The following portions of the patient's history were reviewed and updated as appropriate: allergies, current medications, past family history, past medical history, past social history, past surgical history, and problem list.  Review of Systems Pertinent items noted in HPI and remainder of comprehensive ROS otherwise negative.   Objective:   BP 130/82   Pulse 97   Ht 5\' 3"  (1.6 m)   Wt (!) 320 lb 9.6 oz (145.4 kg)   Breastfeeding Yes   BMI 56.79 kg/m  Body mass index is 56.79 kg/m.  General:  alert and no distress  Abdomen: soft, bowel sounds active, non-tender  Incision:   healing well, no drainage, no erythema, no hernia, no seroma, no swelling, no dehiscence, incision well approximated. Dermabond peeling as anticipated.     Pathology:  N/A  Assessment:   Patient s/p PLTCS Doing well postoperatively. Lactating mother Chronic HTN with superimposed pre-eclampsia.   Plan:   1. Continue any current medications as instructed by provider. 2. Wound care discussed. 3. Operative findings again reviewed. Pathology report discussed. 4. Activity restrictions: no bending, stooping, or squatting, no lifting more than 10-15 pounds,  and pelvic rest 5. Anticipated return to work: 5 weeks if applicable.  6. Follow up: 5 weeks for postpartum visit.     Hildred Laser, MD [facility]

## 2022-10-04 NOTE — Patient Instructions (Signed)

## 2022-10-06 ENCOUNTER — Other Ambulatory Visit (INDEPENDENT_AMBULATORY_CARE_PROVIDER_SITE_OTHER): Payer: Self-pay | Admitting: Certified Nurse Midwife

## 2022-10-08 ENCOUNTER — Other Ambulatory Visit (INDEPENDENT_AMBULATORY_CARE_PROVIDER_SITE_OTHER): Payer: Self-pay | Admitting: Certified Nurse Midwife

## 2022-10-09 ENCOUNTER — Encounter: Payer: Self-pay | Admitting: Obstetrics and Gynecology

## 2022-10-09 MED ORDER — OXYCODONE-ACETAMINOPHEN 5-325 MG PO TABS: 1.0000 | ORAL_TABLET | Freq: Four times a day (QID) | ORAL | 0 refills | Status: DC | PRN

## 2022-10-09 MED ORDER — IBUPROFEN 800 MG PO TABS: 800.0000 mg | ORAL_TABLET | Freq: Three times a day (TID) | ORAL | 0 refills | Status: DC | PRN

## 2022-10-10 ENCOUNTER — Telehealth: Payer: Medicaid Other | Admitting: Obstetrics

## 2022-10-10 ENCOUNTER — Telehealth: Payer: Self-pay

## 2022-10-10 NOTE — Progress Notes (Unsigned)
    10/10/2022    4:01 PM 09/29/2022   11:57 PM 02/13/2022    2:29 PM  Edinburgh Postnatal Depression Scale Screening Tool  I have been able to laugh and see the funny side of things. 2 0 0  I have looked forward with enjoyment to things. 1 0 0  I have blamed myself unnecessarily when things went wrong. 3 1 0  I have been anxious or worried for no good reason. 3 0 0  I have felt scared or panicky for no good reason. 3 0 0  Things have been getting on top of me. 2 1 2   I have been so unhappy that I have had difficulty sleeping. 1 0 0  I have felt sad or miserable. 1 0 0  I have been so unhappy that I have been crying. 2 0 0  The thought of harming myself has occurred to me. 0 0 0  Edinburgh Postnatal Depression Scale Total 18 2 2

## 2022-10-10 NOTE — Telephone Encounter (Signed)
Patient was scheduled to see Paula Compton CNM< for post partum visit/mood check. I emailed patient Katherine Huynh screen and she replied back with a score of 18 when previously was 2. Patient did not arrive for video visit, attempted to reach patient twice with no response. Voice message was left, can we reach out to patient and have her reschedule missed appointment to address post partum depression? Please reach out to patient and reschedule  her to follow up with Dr. Valentino Saxon. Thank you. KW

## 2022-10-11 NOTE — Telephone Encounter (Signed)
I will call the pt to reschedule the 2 week PP MyChart video visit.  She is already scheduled with Shanda Bumps on June 28.

## 2022-10-12 ENCOUNTER — Other Ambulatory Visit: Payer: Medicaid Other

## 2022-10-25 ENCOUNTER — Telehealth: Payer: Self-pay

## 2022-10-25 NOTE — Telephone Encounter (Signed)
[facility]- Discharge Call Backs-Spoke to patient on the phone about the following below. 1-Do you have any questions or concerns about yourself as you heal?No 2-Any concerns or questions about your baby?No Is your baby eating, peeing,pooping well?Yes 3-Reviewed ABC's of safe sleep. 4-How was your stay at the hospital?Good 5- Did our team work together to care for you?Yes You should be receiving a survey in the mail soon.   We would really appreciate it if you could fill that out for us and return it in the mail.  We value the feedback to make improvements and continue the great work we do.   If you have any questions please feel free to call me back at [phone]  

## 2022-10-26 ENCOUNTER — Encounter: Payer: Self-pay | Admitting: Obstetrics and Gynecology

## 2022-11-01 ENCOUNTER — Encounter: Payer: Self-pay | Admitting: *Deleted

## 2022-11-01 ENCOUNTER — Encounter: Payer: Self-pay | Admitting: Obstetrics and Gynecology

## 2022-11-01 ENCOUNTER — Ambulatory Visit (INDEPENDENT_AMBULATORY_CARE_PROVIDER_SITE_OTHER): Payer: Medicaid Other

## 2022-11-01 ENCOUNTER — Emergency Department
Admission: EM | Admit: 2022-11-01 | Discharge: 2022-11-01 | Disposition: A | Discharge status: Home / Self Care | Payer: Medicaid Other | Attending: Emergency Medicine | Admitting: Emergency Medicine

## 2022-11-01 ENCOUNTER — Emergency Department: Payer: Medicaid Other

## 2022-11-01 ENCOUNTER — Other Ambulatory Visit: Payer: Self-pay

## 2022-11-01 VITALS — BP 126/90 | HR 100 | Temp 99.0°F | Ht 63.0 in | Wt 311.0 lb

## 2022-11-01 DIAGNOSIS — I10 Essential (primary) hypertension: Secondary | ICD-10-CM | POA: Insufficient documentation

## 2022-11-01 DIAGNOSIS — R509 Fever, unspecified: Secondary | ICD-10-CM | POA: Insufficient documentation

## 2022-11-01 DIAGNOSIS — Z20822 Contact with and (suspected) exposure to covid-19: Secondary | ICD-10-CM | POA: Diagnosis not present

## 2022-11-01 DIAGNOSIS — M549 Dorsalgia, unspecified: Secondary | ICD-10-CM

## 2022-11-01 DIAGNOSIS — N3000 Acute cystitis without hematuria: Secondary | ICD-10-CM | POA: Insufficient documentation

## 2022-11-01 DIAGNOSIS — R1031 Right lower quadrant pain: Secondary | ICD-10-CM | POA: Diagnosis present

## 2022-11-01 DIAGNOSIS — R6883 Chills (without fever): Secondary | ICD-10-CM

## 2022-11-01 DIAGNOSIS — R000 Tachycardia, unspecified: Secondary | ICD-10-CM | POA: Diagnosis not present

## 2022-11-01 DIAGNOSIS — R410 Disorientation, unspecified: Secondary | ICD-10-CM

## 2022-11-01 LAB — URINALYSIS, ROUTINE W REFLEX MICROSCOPIC
Bilirubin Urine: NEGATIVE
Glucose, UA: NEGATIVE mg/dL
Hgb urine dipstick: NEGATIVE
Ketones, ur: NEGATIVE mg/dL
Nitrite: NEGATIVE
Protein, ur: 100 mg/dL — AB
Specific Gravity, Urine: 1.013 (ref 1.005–1.030)
pH: 6 (ref 5.0–8.0)

## 2022-11-01 LAB — HEPATIC FUNCTION PANEL
ALT: 13 U/L (ref 0–44)
AST: 17 U/L (ref 15–41)
Albumin: 4 g/dL (ref 3.5–5.0)
Alkaline Phosphatase: 85 U/L (ref 38–126)
Bilirubin, Direct: <0.1 mg/dL (ref 0.0–0.2)
Total Bilirubin: 0.7 mg/dL (ref 0.3–1.2)
Total Protein: 7.8 g/dL (ref 6.5–8.1)

## 2022-11-01 LAB — BASIC METABOLIC PANEL
Anion gap: 9 (ref 5–15)
BUN: 12 mg/dL (ref 6–20)
CO2: 22 mmol/L (ref 22–32)
Calcium: 8.8 mg/dL — ABNORMAL LOW (ref 8.9–10.3)
Chloride: 107 mmol/L (ref 98–111)
Creatinine, Ser: 0.97 mg/dL (ref 0.44–1.00)
GFR, Estimated: >60 mL/min (ref >60)
Glucose, Bld: 89 mg/dL (ref 70–99)
Potassium: 4.3 mmol/L (ref 3.5–5.1)
Sodium: 138 mmol/L (ref 135–145)

## 2022-11-01 LAB — RESP PANEL BY RT-PCR (RSV, FLU A&B, COVID)  RVPGX2
Influenza A by PCR: NEGATIVE
Influenza B by PCR: NEGATIVE
Resp Syncytial Virus by PCR: NEGATIVE
SARS Coronavirus 2 by RT PCR: NEGATIVE

## 2022-11-01 LAB — CBC
HCT: 32.9 % — ABNORMAL LOW (ref 36.0–46.0)
Hemoglobin: 10.1 g/dL — ABNORMAL LOW (ref 12.0–15.0)
MCH: 25.3 pg — ABNORMAL LOW (ref 26.0–34.0)
MCHC: 30.7 g/dL (ref 30.0–36.0)
MCV: 82.3 fL (ref 80.0–100.0)
Platelets: 353 10*3/uL (ref 150–400)
RBC: 4 MIL/uL (ref 3.87–5.11)
RDW: 15.6 % — ABNORMAL HIGH (ref 11.5–15.5)
WBC: 9.6 10*3/uL (ref 4.0–10.5)
nRBC: 0 % (ref 0.0–0.2)

## 2022-11-01 LAB — LIPASE, BLOOD: Lipase: 43 U/L (ref 11–51)

## 2022-11-01 LAB — CBG MONITORING, ED: Glucose-Capillary: 80 mg/dL (ref 70–99)

## 2022-11-01 MED ORDER — IOHEXOL 300 MG/ML  SOLN
100.0000 mL | Freq: Once | INTRAMUSCULAR | Status: AC | PRN
  Administered 2022-11-01: 100 mL via INTRAVENOUS

## 2022-11-01 MED ORDER — CEPHALEXIN 500 MG PO CAPS
500.0000 mg | ORAL_CAPSULE | Freq: Once | ORAL | Status: AC
  Administered 2022-11-01: 500 mg via ORAL
  Filled 2022-11-01: qty 1

## 2022-11-01 MED ORDER — CEPHALEXIN 500 MG PO CAPS: 500.0000 mg | ORAL_CAPSULE | Freq: Four times a day (QID) | ORAL | 0 refills | Status: AC

## 2022-11-01 MED ORDER — LACTATED RINGERS IV BOLUS
1000.0000 mL | Freq: Once | INTRAVENOUS | Status: AC
  Administered 2022-11-01: 1000 mL via INTRAVENOUS

## 2022-11-01 NOTE — ED Provider Notes (Signed)
[facility] Provider Note    Event Date/Time   First MD Initiated Contact with Patient 11/01/22 1741     (approximate)   History   Chief Complaint Abdominal Pain  HPI  Katherine Huynh is a 27 y.o. female with past medical history of hypertension, hyperlipidemia, and PCOS who presents to the ED complaining of chills and abdominal pain.  Patient reports that she has been having chills and subjective fevers for about the past week, then developed pain around the right lower quadrant of her abdomen yesterday.  She denies any dysuria or hematuria, has not had any vaginal bleeding or discharge.  She did undergo low-transverse C-section with Dr. Valentino Saxon about 1 month ago, has not had any complications since then.  She thought she may have a UTI and went to follow-up with her OB/GYN earlier today, was told that the urinalysis was unremarkable and that she should come to the ED for further evaluation.  She has not noticed any redness, swelling, or drainage at her surgical site.  She denies any cough, chest pain, or shortness of breath.      Physical Exam   Triage Vital Signs: ED Triage Vitals  Enc Vitals Group     BP 11/01/22 1646 132/88     Pulse Rate 11/01/22 1646 (!) 103     Resp 11/01/22 1646 20     Temp 11/01/22 1646 99.6 F (37.6 C)     Temp Source 11/01/22 1646 Oral     SpO2 11/01/22 1646 100 %     Weight 11/01/22 1647 (!) 311 lb (141.1 kg)     Height 11/01/22 1647 5\' 3"  (1.6 m)     Head Circumference --      Peak Flow --      Pain Score 11/01/22 1646 4     Pain Loc --      Pain Edu? --      Excl. in GC? --     Most recent vital signs: Vitals:   11/01/22 1646 11/01/22 1924  BP: 132/88 134/73  Pulse: (!) 103 99  Resp: 20   Temp: 99.6 F (37.6 C)   SpO2: 100% 100%    Constitutional: Alert and oriented. Eyes: Conjunctivae are normal. Head: Atraumatic. Nose: No congestion/rhinnorhea. Mouth/Throat: Mucous membranes are moist.  Cardiovascular:  Normal rate, regular rhythm. Grossly normal heart sounds.  2+ radial pulses bilaterally. Respiratory: Normal respiratory effort.  No retractions. Lungs CTAB. Gastrointestinal: Soft and tender to palpation with no rebound or guarding. No distention.  Abdominal surgical site with no erythema, edema, warmth, or tenderness. Musculoskeletal: No lower extremity tenderness nor edema.  Neurologic:  Normal speech and language. No gross focal neurologic deficits are appreciated.    ED Results / Procedures / Treatments   Labs (all labs ordered are listed, but only abnormal results are displayed) Labs Reviewed  BASIC METABOLIC PANEL - Abnormal; Notable for the following components:      Result Value   Calcium 8.8 (*)    All other components within normal limits  CBC - Abnormal; Notable for the following components:   Hemoglobin 10.1 (*)    HCT 32.9 (*)    MCH 25.3 (*)    RDW 15.6 (*)    All other components within normal limits  URINALYSIS, ROUTINE W REFLEX MICROSCOPIC - Abnormal; Notable for the following components:   Color, Urine YELLOW (*)    APPearance CLOUDY (*)    Protein, ur 100 (*)    Leukocytes,Ua  LARGE (*)    Bacteria, UA FEW (*)    All other components within normal limits  RESP PANEL BY RT-PCR (RSV, FLU A&B, COVID)  RVPGX2  URINE CULTURE  HEPATIC FUNCTION PANEL  LIPASE, BLOOD  CBG MONITORING, ED   RADIOLOGY CT abdomen/pelvis reviewed and interpreted by me with no inflammatory changes, focal fluid collections, or dilated bowel loops.  PROCEDURES:  Critical Care performed: No  Procedures   MEDICATIONS ORDERED IN ED: Medications  cephALEXin (KEFLEX) capsule 500 mg (has no administration in time range)  lactated ringers bolus 1,000 mL (1,000 mLs Intravenous New Bag/Given 11/01/22 1831)  iohexol (OMNIPAQUE) 300 MG/ML solution 100 mL (100 mLs Intravenous Contrast Given 11/01/22 1854)     IMPRESSION / MDM / ASSESSMENT AND PLAN / ED COURSE  I reviewed the triage vital  signs and the nursing notes.                              27 y.o. female with past medical history of hypertension, hyperlipidemia, PCOS, and cesarean section about 1 month ago who presents to the ED complaining of subjective fevers and chills for 1 week with right lower quadrant abdominal pain since yesterday.  Patient's presentation is most consistent with acute presentation with potential threat to life or bodily function.  Differential diagnosis includes, but is not limited to, appendicitis, retained products of conception, UTI, sepsis, viral syndrome, preeclampsia.  Patient well-appearing and in no acute distress, vital signs remarkable for mild tachycardia but otherwise reassuring, patient with borderline fever at 99.6.  Her abdomen is soft and surgical site appears to be well-healing, although she has tenderness to palpation in the right lower quadrant.  We will further assess with CT imaging for appendicitis or potential surgical complication, low suspicion for retained products at this time as she denies any vaginal bleeding or discharge.  Labs without significant anemia, leukocytosis, showed abnormality, or AKI.  LFTs and lipase are unremarkable.  Urinalysis and viral testing are pending.  CT imaging is unremarkable, no evidence of appendicitis or complication related to her cesarean section.  Blood pressure remains reassuring and not consistent with preeclampsia.  Urinalysis does show evidence of infection which could be contributing to her pain and chills.  We will send urine for culture and treat with Keflex, which is safe with breast-feeding.  Patient counseled to follow-up with her OB/GYN and return to the ED for new or worsening symptoms.  Patient agrees with plan.      FINAL CLINICAL IMPRESSION(S) / ED DIAGNOSES   Final diagnoses:  Right lower quadrant abdominal pain  Chills  Acute cystitis without hematuria     Rx / DC Orders   ED Discharge Orders          Ordered     cephALEXin (KEFLEX) 500 MG capsule  4 times daily        11/01/22 1932             Note:  This document was prepared using Dragon voice recognition software and may include unintentional dictation errors.   Chesley Noon, MD 11/01/22 [phone]

## 2022-11-01 NOTE — Progress Notes (Signed)
    GYNECOLOGY NURSE NOTE  Subjective:    Patient ID: Bronte Dorantes, female    DOB: [birth year], 27 y.o.   MRN: [id number]  HPI  Patient is a 27 y.o. G85P1021 female who presents for Chills,pain in right side confusion, reports pain is so bad in her back she unable to carry newborn and not producing enough milk denied any redness or swelling in breast, she had a vaginal delivery on 09/28/22.Per midwife on call she was suppose to go to ED for evaluation , front desk scheduled her for possible UTI On urine dip, protein and blood (stated just finished period) will send urine for culture , Consulted with Midwife in office [place].Dominic CNM who came in room to advise pt to go to ED.    The following portions of the patient's history were reviewed and updated as appropriate: allergies, current medications, past family history, past medical history, past social history, past surgical history, and problem list.  Review of Systems Pertinent items are noted in HPI.   Objective:   Blood pressure (!) 126/90, pulse 100, height 5\' 3"  (1.6 m), weight (!) 311 lb (141.1 kg), currently breastfeeding. Body mass index is 55.09 kg/m.   Assessment:   1. Chills   2. Confusion      Plan:   There are no diagnoses linked to this encounter.

## 2022-11-01 NOTE — ED Triage Notes (Addendum)
Pt was seen by her gyn md today.  Pt was sent to er for eval  pt has right side pain, fever.  No n/v  pt denies urinary sx.  Pt alert.   Pt has a c-section appro 1 month ago

## 2022-11-01 NOTE — Telephone Encounter (Signed)
Called patient. No answer. LVM and sent mychart message.

## 2022-11-03 LAB — URINE CULTURE: Culture: >=100000 — AB

## 2022-11-07 LAB — URINE CULTURE

## 2022-11-09 ENCOUNTER — Ambulatory Visit (INDEPENDENT_AMBULATORY_CARE_PROVIDER_SITE_OTHER): Payer: Medicaid Other | Admitting: Certified Nurse Midwife

## 2022-11-09 ENCOUNTER — Encounter: Payer: Self-pay | Admitting: Certified Nurse Midwife

## 2022-11-09 MED ORDER — MEDROXYPROGESTERONE ACETATE 150 MG/ML IM SUSP: 150.0000 mg | INTRAMUSCULAR | 3 refills | Status: DC

## 2022-11-09 NOTE — Progress Notes (Unsigned)
Post Partum Visit Note  Katherine Huynh is a 27 y.o. G16P1021 female who presents for a postpartum visit. She is 5 weeks postpartum following a primary cesarean section.  I have fully reviewed the prenatal and intrapartum course. The delivery was at 38 gestational weeks.  Anesthesia: {anesthesia types:812}. Postpartum course has been complicated by UTI. Baby is doing well. Baby is feeding by {breastmilk/bottle:69}. Bleeding {vag bleed:12292}. Bowel function is {normal:32111}. Bladder function is {normal:32111}. Patient {is/is not:9024} sexually active. Contraception method is {contraceptive method:5051}. Postpartum depression screening: {gen negative/positive:315881}.   The pregnancy intention screening data noted above was reviewed. Potential methods of contraception were discussed. The patient elected to proceed with No data recorded.   Edinburgh Postnatal Depression Scale - 11/09/22 1552       Edinburgh Postnatal Depression Scale:  In the Past 7 Days   I have been able to laugh and see the funny side of things. 0    I have looked forward with enjoyment to things. 0    I have blamed myself unnecessarily when things went wrong. 2    I have been anxious or worried for no good reason. 1    I have felt scared or panicky for no good reason. 0    Things have been getting on top of me. 2    I have been so unhappy that I have had difficulty sleeping. 0    I have felt sad or miserable. 0    I have been so unhappy that I have been crying. 0    The thought of harming myself has occurred to me. 0    Edinburgh Postnatal Depression Scale Total 5             Health Maintenance Due  Topic Date Due   COVID-19 Vaccine (1) Never done   FOOT EXAM  Never done   OPHTHALMOLOGY EXAM  Never done   MAMMOGRAM  Never done   HEMOGLOBIN A1C  08/29/2022    {Common ambulatory SmartLinks:19316}  Review of Systems {ros; complete:30496}  Objective:  BP 130/80   Ht 5\' 3"  (1.6 m)   Wt (!) 314 lb (142.4  kg)   LMP 10/27/2022 (Approximate)   Breastfeeding Yes   BMI 55.62 kg/m    General:  {gen appearance:16600}   Breasts:  {desc; normal/abnormal/not indicated:14647}  Lungs: {lung exam:16931}  Heart:  {heart exam:5510}  Abdomen: {abdomen exam:16834}   Wound {Wound assessment:11097}  GU exam:  {desc; normal/abnormal/not indicated:14647}       Assessment:    There are no diagnoses linked to this encounter.  *** postpartum exam.   Plan:   Essential components of care per ACOG recommendations:  1.  Mood and well being: Patient with {gen negative/positive:315881} depression screening today. Reviewed local resources for support.  - Patient tobacco use? {tobacco use:25506}  - hx of drug use? {yes/no:25505}    2. Infant care and feeding:  -Patient currently breastmilk feeding? {yes/no:25502}  -Social determinants of health (SDOH) reviewed in EPIC. No concerns***The following needs were identified***  3. Sexuality, contraception and birth spacing - Patient {DOES_DOES ZOX:09604} want a pregnancy in the next year.  Desired family size is {NUMBER 1-10:22536} children.  - Reviewed reproductive life planning. Reviewed contraceptive methods based on pt preferences and effectiveness.  Patient desired {Upstream End Methods:24109} today.   - Discussed birth spacing of 18 months  4. Sleep and fatigue -Encouraged family/partner/community support of 4 hrs of uninterrupted sleep to help with mood and fatigue  5.  Physical Recovery  - Discussed patients delivery and complications. She describes her labor as {description:25511} - Patient had a {CHL AMB DELIVERY:7188085721}. Patient had a {laceration:25518} laceration. Perineal healing reviewed. Patient expressed understanding - Patient has urinary incontinence? {yes/no:25515} - Patient {ACTION; IS/IS ZOX:09604540} safe to resume physical and sexual activity  6.  Health Maintenance - HM due items addressed {Yes or If no, why not?:20788} - Last  pap smear  Diagnosis  Date Value Ref Range Status  11/15/2020   Final   - Negative for intraepithelial lesion or malignancy (NILM)   Pap smear {done:10129} at today's visit.  -Breast Cancer screening indicated? {indicated:25516}  7. Chronic Disease/Pregnancy Condition follow up: {Follow up:25499}  - PCP follow up  Dominica Severin, CNM [facility], [facility]

## 2022-11-12 ENCOUNTER — Other Ambulatory Visit: Payer: Self-pay | Admitting: Obstetrics and Gynecology

## 2022-11-12 DIAGNOSIS — B952 Enterococcus as the cause of diseases classified elsewhere: Secondary | ICD-10-CM

## 2022-11-12 MED ORDER — NITROFURANTOIN MONOHYD MACRO 100 MG PO CAPS
100.0000 mg | ORAL_CAPSULE | Freq: Two times a day (BID) | ORAL | 0 refills | Status: DC
Start: 2022-11-12 — End: 2023-03-26

## 2022-11-12 NOTE — Progress Notes (Cosign Needed Addendum)
    NURSE VISIT NOTE  Subjective:    Patient ID: Katherine Huynh, female    DOB: 1995/06/03, 27 y.o.   MRN: [id number]  HPI  Patient is a 27 y.o. G33P1021 female who presents for depo provera injection. In note its stated she had it done on 11/09/22 but pt states she did not as we did not have depos in the office at that time, this is her initial depo inj.   Objective:    BP 108/72   Ht 5\' 3"  (1.6 m)   Wt (!) 320 lb (145.2 kg)   LMP 10/27/2022 (Approximate)   BMI 56.69 kg/m   Last Annual: Recent preganacy PP . Last pap: 11/15/20. Last Depo-Provera: n/a this is her initial inj from PP. Side Effects if any: none. Serum HCG indicated? Yes .NEG  Depo-Provera 150 mg IM given by: Beverely Pace, CMA. Site: Left Deltoid     Assessment:   1. Surveillance for Depo-Provera contraception      Plan:   Next appointment due between Sept 17 - Oct 1    Loney Laurence, [place]

## 2022-11-13 ENCOUNTER — Ambulatory Visit (INDEPENDENT_AMBULATORY_CARE_PROVIDER_SITE_OTHER): Payer: Medicaid Other

## 2022-11-13 VITALS — BP 108/72 | Ht 63.0 in | Wt 320.0 lb

## 2022-11-13 DIAGNOSIS — Z3042 Encounter for surveillance of injectable contraceptive: Secondary | ICD-10-CM

## 2022-11-13 DIAGNOSIS — Z3202 Encounter for pregnancy test, result negative: Secondary | ICD-10-CM

## 2022-11-13 LAB — POCT URINE PREGNANCY: Preg Test, Ur: NEGATIVE

## 2022-11-13 MED ORDER — MEDROXYPROGESTERONE ACETATE 150 MG/ML IM SUSY
150.0000 mg | PREFILLED_SYRINGE | Freq: Once | INTRAMUSCULAR | Status: AC
Start: 2022-11-13 — End: 2022-11-13
  Administered 2022-11-13: 150 mg via INTRAMUSCULAR

## 2023-01-29 ENCOUNTER — Ambulatory Visit: Payer: Medicaid Other

## 2023-02-07 ENCOUNTER — Ambulatory Visit: Payer: Medicaid Other

## 2023-02-07 VITALS — BP 119/84 | HR 98 | Ht 63.0 in | Wt 352.0 lb

## 2023-02-07 DIAGNOSIS — Z3042 Encounter for surveillance of injectable contraceptive: Secondary | ICD-10-CM | POA: Diagnosis not present

## 2023-02-07 MED ORDER — MEDROXYPROGESTERONE ACETATE 150 MG/ML IM SUSY
150.0000 mg | PREFILLED_SYRINGE | Freq: Once | INTRAMUSCULAR | Status: AC
  Administered 2023-02-07: 150 mg via INTRAMUSCULAR

## 2023-02-07 NOTE — Progress Notes (Signed)
NURSE VISIT NOTE  Subjective:    Patient ID: Katherine Huynh, female    DOB: 24-Jun-1995, 27 y.o.   MRN: [id number]  HPI  Patient is a 27 y.o. G28P1021 female who presents for depo provera injection.   Objective:    There were no vitals taken for this visit.  Last Annual: Recent preganacy PP. Last pap: 11/15/20.   Last Depo-Provera: 11/18/22. Side Effects if any: none. Serum HCG indicated? No . Depo-Provera 150 mg IM given by: Beverely Pace, CMA. Site: Right Deltoid    Assessment:   No diagnosis found.   Plan:   Next appointment due between DEC12-26    Loney Laurence, CMA

## 2023-03-07 ENCOUNTER — Ambulatory Visit: Payer: Self-pay

## 2023-03-07 NOTE — Telephone Encounter (Signed)
     Chief Complaint: Cough, SOB, runny nose. Symptoms: Above Frequency: 2 years Pertinent Negatives: Patient denies fever Disposition: [] ED /[x] Urgent Care (no appt availability in office) / [] Appointment(In office/virtual)/ []  [facility]/ [] Home Care/ [] Refused Recommended Disposition /[] [facility]/ []  Follow-up with PCP Additional Notes: Agrees with UC. New Patient appointment made.  Reason for Disposition  [1] MILD difficulty breathing (e.g., minimal/no SOB at rest, SOB with walking, pulse <100) AND [2] still present when not coughing  Answer Assessment - Initial Assessment Questions 1. ONSET: "When did the cough begin?"      2 years 2. SEVERITY: "How bad is the cough today?"      Severe 3. SPUTUM: "Describe the color of your sputum" (none, dry cough; clear, white, yellow, green)     Green 4. HEMOPTYSIS: "Are you coughing up any blood?" If so ask: "How much?" (flecks, streaks, tablespoons, etc.)     No 5. DIFFICULTY BREATHING: "Are you having difficulty breathing?" If Yes, ask: "How bad is it?" (e.g., mild, moderate, severe)    - MILD: No SOB at rest, mild SOB with walking, speaks normally in sentences, can lie down, no retractions, pulse < 100.    - MODERATE: SOB at rest, SOB with minimal exertion and prefers to sit, cannot lie down flat, speaks in phrases, mild retractions, audible wheezing, pulse 100-120.    - SEVERE: Very SOB at rest, speaks in single words, struggling to breathe, sitting hunched forward, retractions, pulse > 120      Mild 6. FEVER: "Do you have a fever?" If Yes, ask: "What is your temperature, how was it measured, and when did it start?"     No 7. CARDIAC HISTORY: "Do you have any history of heart disease?" (e.g., heart attack, congestive heart failure)      No 8. LUNG HISTORY: "Do you have any history of lung disease?"  (e.g., pulmonary embolus, asthma, emphysema)     No 9. PE RISK FACTORS: "Do you have a history of blood clots?"  (or: recent major surgery, recent prolonged travel, bedridden)     No 10. OTHER SYMPTOMS: "Do you have any other symptoms?" (e.g., runny nose, wheezing, chest pain)       Runny nose 11. PREGNANCY: "Is there any chance you are pregnant?" "When was your last menstrual period?"       No 12. TRAVEL: "Have you traveled out of the country in the last month?" (e.g., travel history, exposures)       No  Protocols used: Cough - Acute Productive-A-AH

## 2023-03-26 ENCOUNTER — Other Ambulatory Visit: Payer: Self-pay

## 2023-03-26 ENCOUNTER — Ambulatory Visit (INDEPENDENT_AMBULATORY_CARE_PROVIDER_SITE_OTHER): Payer: Medicaid Other | Admitting: Nurse Practitioner

## 2023-03-26 ENCOUNTER — Encounter: Payer: Self-pay | Admitting: Nurse Practitioner

## 2023-03-26 VITALS — BP 124/78 | HR 98 | Temp 97.9°F | Resp 16 | Ht 63.0 in | Wt 328.5 lb

## 2023-03-26 DIAGNOSIS — Z23 Encounter for immunization: Secondary | ICD-10-CM

## 2023-03-26 DIAGNOSIS — F331 Major depressive disorder, recurrent, moderate: Secondary | ICD-10-CM

## 2023-03-26 DIAGNOSIS — I10 Essential (primary) hypertension: Secondary | ICD-10-CM | POA: Diagnosis not present

## 2023-03-26 DIAGNOSIS — E7800 Pure hypercholesterolemia, unspecified: Secondary | ICD-10-CM

## 2023-03-26 DIAGNOSIS — J4520 Mild intermittent asthma, uncomplicated: Secondary | ICD-10-CM

## 2023-03-26 DIAGNOSIS — Z6841 Body Mass Index (BMI) 40.0 and over, adult: Secondary | ICD-10-CM

## 2023-03-26 DIAGNOSIS — Z131 Encounter for screening for diabetes mellitus: Secondary | ICD-10-CM

## 2023-03-26 DIAGNOSIS — Z7689 Persons encountering health services in other specified circumstances: Secondary | ICD-10-CM

## 2023-03-26 MED ORDER — AIRSUPRA 90-80 MCG/ACT IN AERO: 1.0000 | INHALATION_SPRAY | Freq: Four times a day (QID) | RESPIRATORY_TRACT | 5 refills | Status: DC | PRN

## 2023-03-26 MED ORDER — BUPROPION HCL ER (XL) 150 MG PO TB24: 150.0000 mg | ORAL_TABLET | Freq: Every day | ORAL | 0 refills | Status: DC

## 2023-03-26 NOTE — Assessment & Plan Note (Signed)
She only had high blood pressure when she was pregnant.  She is not currently on medication.  Her blood pressure is at goal today.

## 2023-03-26 NOTE — Assessment & Plan Note (Signed)
Please improve cholesterol with diet.

## 2023-03-26 NOTE — Assessment & Plan Note (Signed)
Starting Wellbutrin 150 mg daily.  Follow-up in 4 weeks.

## 2023-03-26 NOTE — Progress Notes (Signed)
BP 124/78   Pulse 98   Temp 97.9 F (36.6 C) (Oral)   Resp 16   Ht 5\' 3"  (1.6 m)   Wt (!) 328 lb 8 oz (149 kg)   SpO2 98%   BMI 58.19 kg/m    Subjective:    Patient ID: Katherine Huynh, female    DOB: 1995/05/23, 27 y.o.   MRN: [id number]  HPI: Katherine Huynh is a 27 y.o. female  Chief Complaint  Patient presents with   Establish Care   Asthma    Seen at [facility]   Establish care: her last physical was few years.  Medical history includes HTN, HLD, asthma, and depression.  Family history includes HTN, HLD, ADD, PCOS, obesity.  Health maintenance due for labs  Hypertension: - blood pressure was only high during pregnancy -Medications: not currently on medication, previously on nifedipine -Checking BP at home (average): does not check -Denies any SOB, CP, vision changes, LE edema or symptoms of hypotension -Diet: recommend DASH diet  -Exercise: recommend 150 min of physical activity weekly       03/26/2023    8:12 AM 02/07/2023    1:14 PM 11/13/2022    3:15 PM  Vitals with BMI  Height 5\' 3"  5\' 3"  5\' 3"   Weight 328 lbs 8 oz 352 lbs 320 lbs  BMI 58.21 62.37 56.7  Systolic 124 119 696  Diastolic 78 84 72  Pulse 98 98      HLD:  -Medications: none - recommend decreasing saturated fats in your diet.  -Last lipid panel:   Lipid Panel     Component Value Date/Time   CHOL 163 01/04/2021 1457   TRIG 103 01/04/2021 1457   HDL 39 (L) 01/04/2021 1457   CHOLHDL 4.2 01/04/2021 1457   CHOLHDL 2.6 02/17/2016 1120   VLDL 19 02/17/2016 1120   LDLCALC 105 (H) 01/04/2021 1457   LABVLDL 19 01/04/2021 1457     Depression/anxiety Medication none, she says that when she was a teenager she was on prozac,  she says that it did help.  She says that she does not recall if she had side effects at the time.  Discussed trying wellbutrin Compliant n/a Side effects n/a PHQ9 positive GAD negative     03/26/2023    8:11 AM 05/17/2022   12:59 PM 04/10/2022    3:03 PM 04/10/2022    2:52 PM  01/04/2021    2:12 PM  Depression screen PHQ 2/9  Decreased Interest 1 0 0 0 3  Down, Depressed, Hopeless 2 0 0 0 3  PHQ - 2 Score 3 0 0 0 6  Altered sleeping 3 0 0  3  Tired, decreased energy 1 0 3  3  Change in appetite 3 0 0  3  Feeling bad or failure about yourself  0 0 0  3  Trouble concentrating 0 0 0  3  Moving slowly or fidgety/restless 0 0 0  0  Suicidal thoughts 0 0 0  1  PHQ-9 Score 10 0 3  22  Difficult doing work/chores   Somewhat difficult  Extremely dIfficult       03/26/2023    8:12 AM 04/10/2022    3:03 PM 01/04/2021    2:12 PM 09/20/2020    4:57 PM  GAD 7 : Generalized Anxiety Score  Nervous, Anxious, on Edge 0 0 3 3  Control/stop worrying 0 0 3 3  Worry too much - different things 0 0 3 3  Trouble  relaxing 0 0 3 3  Restless 0 0 2 1  Easily annoyed or irritable 0 3 3 3   Afraid - awful might happen 0 1 3 3   Total GAD 7 Score 0 4 20 19   Anxiety Difficulty Not difficult at all Somewhat difficult Extremely difficult Extremely difficult     Obesity:  Current weight : 328 lbs BMI: 58.19 Previous  weight:352 lbs Treatment Tried: lifestyle modification Comorbidities: HTN, HLD   Asthma: patient reports she was recently seen at urgent care for her asthma.  She says that she continues to have wheezing.  She reports that she uses her albuterol inhaler every day.  Will start [place].   Relevant past medical, surgical, family and social history reviewed and updated as indicated. Interim medical history since our last visit reviewed. Allergies and medications reviewed and updated.  Review of Systems  Constitutional: Negative for fever or weight change.  Respiratory: Negative for cough and shortness of breath.   Cardiovascular: Negative for chest pain or palpitations.  Gastrointestinal: Negative for abdominal pain, no bowel changes.  Musculoskeletal: Negative for gait problem or joint swelling.  Skin: Negative for rash.  Neurological: Negative for dizziness or  headache.  No other specific complaints in a complete review of systems (except as listed in HPI above).      Objective:    BP 124/78   Pulse 98   Temp 97.9 F (36.6 C) (Oral)   Resp 16   Ht 5\' 3"  (1.6 m)   Wt (!) 328 lb 8 oz (149 kg)   SpO2 98%   BMI 58.19 kg/m   Wt Readings from Last 3 Encounters:  03/26/23 (!) 328 lb 8 oz (149 kg)  02/07/23 (!) 352 lb (159.7 kg)  11/13/22 (!) 320 lb (145.2 kg)    Physical Exam  Constitutional: Patient appears well-developed and well-nourished. Obese  No distress.  HEENT: head atraumatic, normocephalic, pupils equal and reactive to light, neck supple, throat within normal limits Cardiovascular: Normal rate, regular rhythm and normal heart sounds.  No murmur heard. No BLE edema. Pulmonary/Chest: Effort normal and breath sounds normal. No respiratory distress. Abdominal: Soft.  There is no tenderness. Psychiatric: Patient has a normal mood and affect. behavior is normal. Judgment and thought content normal.  Results for orders placed or performed in visit on 11/13/22  POCT urine pregnancy  Result Value Ref Range   Preg Test, Ur Negative Negative      Assessment & Plan:   Problem List Items Addressed This Visit       Cardiovascular and Mediastinum   Primary hypertension - Primary    She only had high blood pressure when she was pregnant.  She is not currently on medication.  Her blood pressure is at goal today.      Relevant Orders   CBC with Differential/Platelet   COMPLETE METABOLIC PANEL WITH GFR     Respiratory   Mild intermittent asthma without complication    She says her asthma is still not controlled.  Will start her on Airsupra.      Relevant Medications   albuterol (VENTOLIN HFA) 108 (90 Base) MCG/ACT inhaler   Albuterol-Budesonide (AIRSUPRA) 90-80 MCG/ACT AERO     Other   Morbid obesity with BMI of 60.0-69.9, adult (HCC)    starting Wellbutrin 150 mg daily.  Continue to work on lifestyle modification.       Relevant Medications   buPROPion (WELLBUTRIN XL) 150 MG 24 hr tablet   Moderate episode of recurrent  major depressive disorder (HCC)    Starting Wellbutrin 150 mg daily.  Follow-up in 4 weeks.      Relevant Medications   buPROPion (WELLBUTRIN XL) 150 MG 24 hr tablet   Pure hypercholesterolemia    Please improve cholesterol with diet.      Relevant Orders   Lipid panel   Other Visit Diagnoses     Need for influenza vaccination       Relevant Orders   Flu vaccine trivalent PF, 6mos and older(Flulaval,Afluria,Fluarix,Fluzone) (Completed)   Encounter to establish care       Screening for diabetes mellitus       Relevant Orders   Hemoglobin A1c   Lipid panel        Follow up plan: Return in about 4 weeks (around 04/23/2023) for follow up.

## 2023-03-26 NOTE — Assessment & Plan Note (Signed)
She says her asthma is still not controlled.  Will start her on Airsupra.

## 2023-03-26 NOTE — Assessment & Plan Note (Addendum)
starting Wellbutrin 150 mg daily.  Continue to work on lifestyle modification.

## 2023-03-27 LAB — CBC WITH DIFFERENTIAL/PLATELET
Absolute Lymphocytes: 2673 {cells}/uL (ref 850–3900)
Absolute Monocytes: 374 {cells}/uL (ref 200–950)
Basophils Absolute: 42 {cells}/uL (ref 0–200)
Basophils Relative: 0.5 %
Eosinophils Absolute: 531 {cells}/uL — ABNORMAL HIGH (ref 15–500)
Eosinophils Relative: 6.4 %
HCT: 39.6 % (ref 35.0–45.0)
Hemoglobin: 12.6 g/dL (ref 11.7–15.5)
MCH: 24.6 pg — ABNORMAL LOW (ref 27.0–33.0)
MCHC: 31.8 g/dL — ABNORMAL LOW (ref 32.0–36.0)
MCV: 77.3 fL — ABNORMAL LOW (ref 80.0–100.0)
MPV: 10.8 fL (ref 7.5–12.5)
Monocytes Relative: 4.5 %
Neutro Abs: 4681 {cells}/uL (ref 1500–7800)
Neutrophils Relative %: 56.4 %
Platelets: 309 10*3/uL (ref 140–400)
RBC: 5.12 10*6/uL — ABNORMAL HIGH (ref 3.80–5.10)
RDW: 17.7 % — ABNORMAL HIGH (ref 11.0–15.0)
Total Lymphocyte: 32.2 %
WBC: 8.3 10*3/uL (ref 3.8–10.8)

## 2023-03-27 LAB — LIPID PANEL
Cholesterol: 161 mg/dL (ref <200)
HDL: 42 mg/dL — ABNORMAL LOW (ref >=50)
LDL Cholesterol (Calc): 104 mg/dL — ABNORMAL HIGH
Non-HDL Cholesterol (Calc): 119 mg/dL (ref <130)
Total CHOL/HDL Ratio: 3.8 (calc) (ref <5.0)
Triglycerides: 65 mg/dL (ref <150)

## 2023-03-27 LAB — COMPLETE METABOLIC PANEL WITHOUT GFR
AG Ratio: 1.2 (calc) (ref 1.0–2.5)
ALT: 28 U/L (ref 6–29)
AST: 16 U/L (ref 10–30)
Albumin: 4.1 g/dL (ref 3.6–5.1)
Alkaline phosphatase (APISO): 103 U/L (ref 31–125)
BUN: 12 mg/dL (ref 7–25)
CO2: 24 mmol/L (ref 20–32)
Calcium: 9.1 mg/dL (ref 8.6–10.2)
Chloride: 107 mmol/L (ref 98–110)
Creat: 0.78 mg/dL (ref 0.50–0.96)
Globulin: 3.5 g/dL (ref 1.9–3.7)
Glucose, Bld: 89 mg/dL (ref 65–99)
Potassium: 4.1 mmol/L (ref 3.5–5.3)
Sodium: 140 mmol/L (ref 135–146)
Total Bilirubin: 0.4 mg/dL (ref 0.2–1.2)
Total Protein: 7.6 g/dL (ref 6.1–8.1)
eGFR: 107 mL/min/{1.73_m2}

## 2023-03-27 LAB — HEMOGLOBIN A1C
Hgb A1c MFr Bld: 5.8 %{Hb} — ABNORMAL HIGH (ref <5.7)
Mean Plasma Glucose: 120 mg/dL
eAG (mmol/L): 6.6 mmol/L

## 2023-03-30 ENCOUNTER — Encounter: Payer: Self-pay | Admitting: Nurse Practitioner

## 2023-04-02 ENCOUNTER — Other Ambulatory Visit: Payer: Self-pay | Admitting: Nurse Practitioner

## 2023-04-02 DIAGNOSIS — J4520 Mild intermittent asthma, uncomplicated: Secondary | ICD-10-CM

## 2023-04-02 MED ORDER — ALBUTEROL SULFATE HFA 108 (90 BASE) MCG/ACT IN AERS: 1.0000 | INHALATION_SPRAY | Freq: Four times a day (QID) | RESPIRATORY_TRACT | 5 refills | Status: DC | PRN

## 2023-04-02 MED ORDER — DULERA 50-5 MCG/ACT IN AERO: 2.0000 | INHALATION_SPRAY | Freq: Two times a day (BID) | RESPIRATORY_TRACT | 3 refills | Status: DC

## 2023-04-24 ENCOUNTER — Encounter: Payer: Self-pay | Admitting: Nurse Practitioner

## 2023-04-24 ENCOUNTER — Ambulatory Visit: Payer: Medicaid Other | Admitting: Nurse Practitioner

## 2023-04-24 VITALS — BP 118/78 | HR 95 | Temp 98.4°F | Resp 16 | Ht 63.0 in | Wt 330.3 lb

## 2023-04-24 DIAGNOSIS — M545 Low back pain, unspecified: Secondary | ICD-10-CM

## 2023-04-24 DIAGNOSIS — I10 Essential (primary) hypertension: Secondary | ICD-10-CM

## 2023-04-24 DIAGNOSIS — F331 Major depressive disorder, recurrent, moderate: Secondary | ICD-10-CM

## 2023-04-24 DIAGNOSIS — E282 Polycystic ovarian syndrome: Secondary | ICD-10-CM

## 2023-04-24 DIAGNOSIS — G8929 Other chronic pain: Secondary | ICD-10-CM

## 2023-04-24 DIAGNOSIS — J4520 Mild intermittent asthma, uncomplicated: Secondary | ICD-10-CM | POA: Diagnosis not present

## 2023-04-24 DIAGNOSIS — E7800 Pure hypercholesterolemia, unspecified: Secondary | ICD-10-CM

## 2023-04-24 DIAGNOSIS — Z6841 Body Mass Index (BMI) 40.0 and over, adult: Secondary | ICD-10-CM

## 2023-04-24 LAB — POCT URINALYSIS DIPSTICK
Bilirubin, UA: NEGATIVE
Glucose, UA: NEGATIVE
Ketones, UA: NEGATIVE
Nitrite, UA: NEGATIVE
Protein, UA: POSITIVE — AB
Spec Grav, UA: >=1.03 — AB (ref 1.010–1.025)
Urobilinogen, UA: 0.2 U/dL
pH, UA: 6 (ref 5.0–8.0)

## 2023-04-24 LAB — POCT URINE PREGNANCY: Preg Test, Ur: NEGATIVE

## 2023-04-24 MED ORDER — SERTRALINE HCL 25 MG PO TABS: 25.0000 mg | ORAL_TABLET | Freq: Every day | ORAL | 0 refills | Status: DC

## 2023-04-24 MED ORDER — AIRSUPRA 90-80 MCG/ACT IN AERO
1.0000 | INHALATION_SPRAY | RESPIRATORY_TRACT | 5 refills | Status: AC | PRN
Start: 2023-04-24 — End: ?

## 2023-04-24 MED ORDER — ZEPBOUND 2.5 MG/0.5ML ~~LOC~~ SOAJ: 2.5000 mg | SUBCUTANEOUS | 0 refills | Status: DC

## 2023-04-24 NOTE — Progress Notes (Signed)
BP 118/78 (BP Location: Right Arm, Patient Position: Sitting, Cuff Size: Large)   Pulse 95   Temp 98.4 F (36.9 C) (Oral)   Resp 16   Ht 5\' 3"  (1.6 m) Comment: per chart  Wt (!) 330 lb 4.8 oz (149.8 kg)   SpO2 100%   Breastfeeding No   BMI 58.51 kg/m    Subjective:    Patient ID: Katherine Huynh, female    DOB: [birth year], 27 y.o.   MRN: [id number]  HPI: Katherine Huynh is a 27 y.o. female  Chief Complaint  Patient presents with   Medical Management of Chronic Issues   Possible Pregnancy    Discussed the use of AI scribe software for clinical note transcription with the patient, who gave verbal consent to proceed.  History of Present Illness   The patient, with a history of hypertension, hyperlipidemia, and PCOS, presents with persistent lower back pain, irritability, and concerns about weight management. The patient's weight has been stable at 328 pounds with a BMI of 58.51. The patient reports lower back pain, localized in the middle of the back, which has been present for about three months. The pain is exacerbated by lifting her daughter. The patient denies any known trauma or precipitating events.  The patient also reports increased irritability and anger. She has been on Wellbutrin 150mg  daily for depression and weight management, but reports no improvement in mood symptoms. The patient has a history of being on Prozac, which was also ineffective.  The patient expresses a desire to manage her weight more effectively, especially considering her young child. She has previously been on Ozempic for insulin resistance and weight management before pregnancy but stopped and never resumed. The patient reports experiencing nausea as a side effect of Ozempic.       04/24/2023    1:57 PM 03/26/2023    8:11 AM 05/17/2022   12:59 PM  Depression screen PHQ 2/9  Decreased Interest 1 1 0  Down, Depressed, Hopeless 1 2 0  PHQ - 2 Score 2 3 0  Altered sleeping 3 3 0  Tired, decreased energy  0 1 0  Change in appetite 3 3 0  Feeling bad or failure about yourself  0 0 0  Trouble concentrating 0 0 0  Moving slowly or fidgety/restless 0 0 0  Suicidal thoughts 0 0 0  PHQ-9 Score 8 10 0  Difficult doing work/chores Very difficult      Relevant past medical, surgical, family and social history reviewed and updated as indicated. Interim medical history since our last visit reviewed. Allergies and medications reviewed and updated.  Review of Systems  Ten systems reviewed and is negative except as mentioned in HPI       Objective:    BP 118/78 (BP Location: Right Arm, Patient Position: Sitting, Cuff Size: Large)   Pulse 95   Temp 98.4 F (36.9 C) (Oral)   Resp 16   Ht 5\' 3"  (1.6 m) Comment: per chart  Wt (!) 330 lb 4.8 oz (149.8 kg)   SpO2 100%   Breastfeeding No   BMI 58.51 kg/m    Wt Readings from Last 3 Encounters:  04/24/23 (!) 330 lb 4.8 oz (149.8 kg)  03/26/23 (!) 328 lb 8 oz (149 kg)  02/07/23 (!) 352 lb (159.7 kg)    Physical Exam  Constitutional: Patient appears well-developed and well-nourished. Obese  No distress.  HEENT: head atraumatic, normocephalic, pupils equal and reactive to light, neck supplelow  Cardiovascular: Normal  rate, regular rhythm and normal heart sounds.  No murmur heard. No BLE edema. Pulmonary/Chest: Effort normal and breath sounds normal. No respiratory distress. Abdominal: Soft.  There is no tenderness. Psychiatric: Patient has a normal mood and affect. behavior is normal. Judgment and thought content normal.  Results for orders placed or performed in visit on 04/24/23  POCT Urinalysis Dipstick  Result Value Ref Range   Color, UA dark yellow    Clarity, UA turbid    Glucose, UA Negative Negative   Bilirubin, UA Negative    Ketones, UA Negative    Spec Grav, UA >=1.030 (A) 1.010 - 1.025   Blood, UA Large    pH, UA 6.0 5.0 - 8.0   Protein, UA Positive (A) Negative   Urobilinogen, UA 0.2 0.2 or 1.0 E.U./dL   Nitrite, UA  Negative    Leukocytes, UA Trace (A) Negative   Appearance Cloudy/bloody    Odor Iron   POCT urine pregnancy  Result Value Ref Range   Preg Test, Ur Negative Negative       Assessment & Plan:   Problem List Items Addressed This Visit       Respiratory   Mild intermittent asthma without complication - Primary   Relevant Medications   Albuterol-Budesonide (AIRSUPRA) 90-80 MCG/ACT AERO     Endocrine   Polycystic disease, ovaries (Chronic)   Relevant Medications   tirzepatide (ZEPBOUND) 2.5 MG/0.5ML Pen     Other   Morbid obesity with BMI of 60.0-69.9, adult (HCC)   Relevant Medications   tirzepatide (ZEPBOUND) 2.5 MG/0.5ML Pen   Moderate episode of recurrent major depressive disorder (HCC)   Relevant Medications   sertraline (ZOLOFT) 25 MG tablet   Other Visit Diagnoses     Chronic midline low back pain without sciatica       Relevant Medications   sertraline (ZOLOFT) 25 MG tablet   Other Relevant Orders   POCT Urinalysis Dipstick (Completed)   Urine Culture   POCT urine pregnancy (Completed)   DG Lumbar Spine Complete        Assessment and Plan    Lower Back Pain  localized to the midline, exacerbated by lifting. No known trauma. -Order lumbar spine x-ray. -Advise over-the-counter ibuprofen for pain relief.  Depression Current treatment with Wellbutrin 150mg  daily not effective, with increased irritability reported. Previous trial of Prozac also ineffective. -Discontinue Wellbutrin. -Initiate Zoloft 25mg  daily. -Schedule follow-up in 4 weeks to assess response to new medication.  Weight Management Current weight 330 pounds, BMI 58.51.  History of PCOS, hypertension, and hyperlipidemia. Previous use of Ozempic for insulin resistance and weight management. -Discontinue Wellbutrin. -Initiate Zepbound  for weight management.  Asthma Insurance approval for AirDuo RespiClick (fluticasone/salmeterol) received after switch to albuterol. -Resend prescription  for AirDuo RespiClick. -Advise to rinse mouth after use due to steroid component.        Follow up plan: Return in about 1 week (around 05/01/2023) for follow up.

## 2023-04-25 ENCOUNTER — Ambulatory Visit: Payer: Medicaid Other

## 2023-04-25 VITALS — BP 125/89 | HR 91 | Ht 63.0 in | Wt 330.0 lb

## 2023-04-25 DIAGNOSIS — Z3042 Encounter for surveillance of injectable contraceptive: Secondary | ICD-10-CM | POA: Diagnosis not present

## 2023-04-25 MED ORDER — MEDROXYPROGESTERONE ACETATE 150 MG/ML IM SUSP
150.0000 mg | Freq: Once | INTRAMUSCULAR | Status: AC
  Administered 2023-04-25: 150 mg via INTRAMUSCULAR

## 2023-04-25 NOTE — Patient Instructions (Signed)
Contraceptive Injection A contraceptive injection is a shot that prevents pregnancy. It is also called a birth control shot. The shot contains the hormone progestin, which prevents pregnancy by: Stopping the ovaries from releasing eggs. Thickening cervical mucus to prevent sperm from entering the cervix. Thinning the lining of the uterus to prevent a fertilized egg from attaching to the uterus. Contraceptive injections are given under the skin (subcutaneous) or into a muscle (intramuscular). For these shots to work, you must get one of them every 3 months (12-13 weeks) from a health care provider. Tell a health care provider about: Any allergies you have. All medicines you are taking, including vitamins, herbs, eye drops, creams, and over-the-counter medicines. Any blood disorders you have. Any medical conditions you have. Whether you are pregnant or may be pregnant. What are the risks? Generally, this is a safe procedure. However, problems may occur, including: Mood changes or depression. Loss of bone density (osteoporosis) after long-term use. Blood clots. This is rare. Higher risk of an egg being fertilized outside your uterus (ectopic pregnancy).This is rare. What happens before the procedure? Your health care provider may do a routine physical exam. You may have a test to make sure you are not pregnant. What happens during the procedure?  The area where the shot will be given will be cleaned and sanitized with alcohol. A needle will be inserted into a muscle in your upper arm or buttock, or into the skin of your thigh or abdomen. The needle will be attached to a syringe with the medicine inside of it. The medicine will be pushed through the syringe and injected into your body. A small bandage (dressing) may be placed over the injection site. What can I expect after the procedure? After the procedure, it is common to have: Soreness around the injection site for a couple of  days. Irregular menstrual bleeding. Weight gain. Breast tenderness. Headaches. Discomfort in your abdomen. Ask your health care provider whether you need to use an added method of birth control (backup contraception), such as a condom, sponge, or spermicide. If the first shot is given 1-7 days after the start of your last menstrual period, you will not need backup contraception. If the first shot is given at any other time during your menstrual cycle, you should avoid having sex. If you do have sex, you will need to use backup contraception for 7 days after you receive the shot. Follow these instructions at home: General instructions Take over-the-counter and prescription medicines only as told by your health care provider. Do not rub or massage the injection site. Track your menstrual periods so you will know if they become irregular. Always use a condom to protect against sexually transmitted infections (STIs). Make sure you schedule an appointment in time for your next shot and mark it on your calendar. You must get an injection every 3 months (12-13 weeks) to prevent pregnancy. Lifestyle Do not use any products that contain nicotine or tobacco. These products include cigarettes, chewing tobacco, and vaping devices, such as e-cigarettes. If you need help quitting, ask your health care provider. Eat foods that are high in calcium and vitamin D, such as milk, cheese, and salmon. Doing this may help with any loss in bone density caused by the contraceptive injection. Ask your health care provider for dietary recommendations. Contact a health care provider if you: Have nausea or vomiting. Have abnormal vaginal discharge or bleeding. Miss a menstrual period or think you might be pregnant. Experience mood changes   or depression. Feel dizzy or light-headed. Have leg pain. Get help right away if you: Have chest pain or cough up blood. Have shortness of breath. Have a severe headache that does  not go away. Have numbness in any part of your body. Have slurred speech or vision problems. Have vaginal bleeding that is abnormally heavy or does not stop, or you have severe pain in your abdomen. Have depression that does not get better with treatment. If you ever feel like you may hurt yourself or others, or have thoughts about taking your own life, get help right away. Go to your nearest emergency department or: Call your local emergency services (911 in the [place] Call a suicide crisis helpline, such as the National Suicide Prevention Lifeline at 1-800-273-8255 or 988 in the [place] This is open 24 hours a day in the [place] Text the Crisis Text Line at 741741 (in the [place] Summary A contraceptive injection is a shot that prevents pregnancy. It is also called the birth control shot. The shot is given under the skin (subcutaneous) or into a muscle (intramuscular). After this procedure, it is common to have soreness around the injection site for a couple of days. To prevent pregnancy, the shot must be given by a health care provider every 3 months (12-13 weeks). After you have the shot, ask your health care provider whether you need to use an added method of birth control (backup contraception), such as a condom, sponge, or spermicide. This information is not intended to replace advice given to you by your health care provider. Make sure you discuss any questions you have with your health care provider. Document Revised: 11/23/2020 Document Reviewed: 11/09/2019 Elsevier Patient Education  2024 [employer] Inc.  

## 2023-04-25 NOTE — Progress Notes (Signed)
    NURSE VISIT NOTE  Subjective:    Patient ID: Katherine Huynh, female    DOB: [birth year], 27 y.o.   MRN: [id number]  HPI  Patient is a 27 y.o. G61P1021 female who presents for depo provera injection.   Objective:    There were no vitals taken for this visit.  Last Annual: 11/09/22 6 week pp. Last pap: 11/15/20. Last Depo-Provera: 02/07/23. Side Effects if any: none. Serum HCG indicated? No . Depo-Provera 150 mg IM given by: Cornelius Moras, CMA. Site: Right Deltoid    Assessment:   1. Surveillance for Depo-Provera contraception      Plan:   Next appointment due between 07/11/23 and 07/25/23.    Cornelius Moras, CMA

## 2023-04-26 ENCOUNTER — Other Ambulatory Visit: Payer: Self-pay | Admitting: Nurse Practitioner

## 2023-04-26 DIAGNOSIS — N3000 Acute cystitis without hematuria: Secondary | ICD-10-CM

## 2023-04-26 LAB — URINE CULTURE
MICRO NUMBER:: 15839067
SPECIMEN QUALITY:: ADEQUATE

## 2023-04-26 MED ORDER — NITROFURANTOIN MONOHYD MACRO 100 MG PO CAPS: 100.0000 mg | ORAL_CAPSULE | Freq: Two times a day (BID) | ORAL | 0 refills | Status: DC

## 2023-05-01 ENCOUNTER — Telehealth: Payer: Self-pay

## 2023-05-01 NOTE — Telephone Encounter (Signed)
Faxed documentation to appeal [employer] decision for Zepbound

## 2023-05-02 ENCOUNTER — Ambulatory Visit: Payer: Self-pay | Admitting: Nurse Practitioner

## 2023-05-02 ENCOUNTER — Encounter: Payer: Self-pay | Admitting: Nurse Practitioner

## 2023-05-03 ENCOUNTER — Other Ambulatory Visit: Payer: Self-pay | Admitting: Nurse Practitioner

## 2023-05-03 MED ORDER — SAXENDA 18 MG/3ML ~~LOC~~ SOPN: 0.6000 mg | PEN_INJECTOR | Freq: Every day | SUBCUTANEOUS | 0 refills | Status: DC

## 2023-05-22 ENCOUNTER — Ambulatory Visit: Payer: Medicaid Other | Admitting: Nurse Practitioner

## 2023-05-22 ENCOUNTER — Other Ambulatory Visit: Payer: Self-pay | Admitting: Nurse Practitioner

## 2023-05-22 ENCOUNTER — Encounter: Payer: Self-pay | Admitting: Nurse Practitioner

## 2023-05-22 VITALS — BP 134/90 | Temp 98.3°F | Resp 18 | Ht 63.0 in | Wt 331.5 lb

## 2023-05-22 DIAGNOSIS — F331 Major depressive disorder, recurrent, moderate: Secondary | ICD-10-CM

## 2023-05-22 DIAGNOSIS — I10 Essential (primary) hypertension: Secondary | ICD-10-CM

## 2023-05-22 DIAGNOSIS — F419 Anxiety disorder, unspecified: Secondary | ICD-10-CM | POA: Diagnosis not present

## 2023-05-22 DIAGNOSIS — Z6841 Body Mass Index (BMI) 40.0 and over, adult: Secondary | ICD-10-CM

## 2023-05-22 DIAGNOSIS — J309 Allergic rhinitis, unspecified: Secondary | ICD-10-CM

## 2023-05-22 MED ORDER — SERTRALINE HCL 25 MG PO TABS: 25.0000 mg | ORAL_TABLET | Freq: Every day | ORAL | 1 refills | Status: DC

## 2023-05-22 MED ORDER — LORATADINE 10 MG PO TABS: 10.0000 mg | ORAL_TABLET | Freq: Every day | ORAL | 3 refills | Status: DC

## 2023-05-22 MED ORDER — ZEPBOUND 2.5 MG/0.5ML ~~LOC~~ SOAJ: 2.5000 mg | SUBCUTANEOUS | 0 refills | Status: DC

## 2023-05-22 NOTE — Progress Notes (Addendum)
 BP (!) 134/90   Temp 98.3 F (36.8 C)   Resp 18   Ht 5' 3 (1.6 m)   Wt (!) 331 lb 8 oz (150.4 kg)   BMI 58.72 kg/m    Subjective:    Patient ID: Katherine Huynh, female    DOB: 04/17/1996, 28 y.o.   MRN: [id number]  HPI: Katherine Huynh is a 28 y.o. female  Chief Complaint  Patient presents with   Medical Management of Chronic Issues    Discussed the use of AI scribe software for clinical note transcription with the patient, who gave verbal consent to proceed.  History of Present Illness   The patient, with a history of hypertension and depression, presents for a four-week follow-up visit after starting Zoloft  25mg  daily. She reports feeling 'noticeably less irritable' and is satisfied with the current dosage. However, her blood pressure was  elevated today, which she attributes to recent stress. She is not currently on any blood pressure medication.  In addition to her hypertension and depression, the patient has been struggling with anxiety. She reports feeling 'nervous, anxious or on edge' more than half the days, and has trouble relaxing. She also reports 'worrying too much about different things' and 'becoming easily annoyed or irritable' several days a week. Despite these symptoms, she feels that the Zoloft  is helping her manage her emotions better.  The patient also mentions an unsuccessful attempt to get a prescription for Saxenda  or Zepbound  for weight management, which was denied by her insurance. She also requests a prescription for an over-the-counter allergy medication like Claritin  or Allegra.       05/22/2023    1:51 PM 04/24/2023    1:57 PM 03/26/2023    8:11 AM  Depression screen PHQ 2/9  Decreased Interest 0 1 1  Down, Depressed, Hopeless 1 1 2   PHQ - 2 Score 1 2 3   Altered sleeping 3 3 3   Tired, decreased energy 3 0 1  Change in appetite 3 3 3   Feeling bad or failure about yourself  0 0 0  Trouble concentrating 0 0 0  Moving slowly or fidgety/restless 0 0 0   Suicidal thoughts 0 0 0  PHQ-9 Score 10 8 10   Difficult doing work/chores Somewhat difficult Very difficult        05/22/2023    1:52 PM 04/24/2023    1:58 PM 03/26/2023    8:12 AM 04/10/2022    3:03 PM  GAD 7 : Generalized Anxiety Score  Nervous, Anxious, on Edge 2 0 0 0  Control/stop worrying 2 2 0 0  Worry too much - different things 3 2 0 0  Trouble relaxing 2 3 0 0  Restless 0 0 0 0  Easily annoyed or irritable 1 3 0 3  Afraid - awful might happen 1 0 0 1  Total GAD 7 Score 11 10 0 4  Anxiety Difficulty Somewhat difficult Extremely difficult Not difficult at all Somewhat difficult     Relevant past medical, surgical, family and social history reviewed and updated as indicated. Interim medical history since our last visit reviewed. Allergies and medications reviewed and updated.  Review of Systems  Constitutional: Negative for fever or weight change.  Respiratory: Negative for cough and shortness of breath.   Cardiovascular: Negative for chest pain or palpitations.  Gastrointestinal: Negative for abdominal pain, no bowel changes.  Musculoskeletal: Negative for gait problem or joint swelling.  Skin: Negative for rash.  Neurological: Negative for dizziness or headache.  No other specific complaints in a complete review of systems (except as listed in HPI above).      Objective:    BP (!) 134/90   Temp 98.3 F (36.8 C)   Resp 18   Ht 5' 3 (1.6 m)   Wt (!) 331 lb 8 oz (150.4 kg)   BMI 58.72 kg/m   BP Readings from Last 3 Encounters:  05/22/23 (!) 134/90  04/25/23 125/89  04/24/23 118/78     Wt Readings from Last 3 Encounters:  05/22/23 (!) 331 lb 8 oz (150.4 kg)  04/25/23 (!) 330 lb (149.7 kg)  04/24/23 (!) 330 lb 4.8 oz (149.8 kg)    Physical Exam  Constitutional: Patient appears well-developed and well-nourished. Obese  No distress.  HEENT: head atraumatic, normocephalic, pupils equal and reactive to light, neck supple Cardiovascular: Normal rate,  regular rhythm and normal heart sounds.  No murmur heard. No BLE edema. Pulmonary/Chest: Effort normal and breath sounds normal. No respiratory distress. Abdominal: Soft.  There is no tenderness. Psychiatric: Patient has a normal mood and affect. behavior is normal. Judgment and thought content normal.  Results for orders placed or performed in visit on 04/24/23  POCT urine pregnancy   Collection Time: 04/24/23  2:01 PM  Result Value Ref Range   Preg Test, Ur Negative Negative  POCT Urinalysis Dipstick   Collection Time: 04/24/23  2:03 PM  Result Value Ref Range   Color, UA dark yellow    Clarity, UA turbid    Glucose, UA Negative Negative   Bilirubin, UA Negative    Ketones, UA Negative    Spec Grav, UA >=1.030 (A) 1.010 - 1.025   Blood, UA Large    pH, UA 6.0 5.0 - 8.0   Protein, UA Positive (A) Negative   Urobilinogen, UA 0.2 0.2 or 1.0 E.U./dL   Nitrite, UA Negative    Leukocytes, UA Trace (A) Negative   Appearance Cloudy/bloody    Odor Iron   Urine Culture   Collection Time: 04/24/23  3:38 PM   Specimen: Urine  Result Value Ref Range   MICRO NUMBER: [id number]    SPECIMEN QUALITY: Adequate    Sample Source URINE    STATUS: FINAL    ISOLATE 1: Escherichia coli (A)       Susceptibility   Escherichia coli - URINE CULTURE, REFLEX    AMOX/CLAVULANIC 16 Intermediate     AMPICILLIN  >=32 Resistant     AMPICILLIN /SULBACTAM 16 Intermediate     CEFAZOLIN * <=4 Not Reportable      * For infections other than uncomplicated UTI caused by E. coli, K. pneumoniae or P. mirabilis: Cefazolin  is resistant if MIC > or = 8 mcg/mL. (Distinguishing susceptible versus intermediate for isolates with MIC < or = 4 mcg/mL requires additional testing.) For uncomplicated UTI caused by E. coli, K. pneumoniae or P. mirabilis: Cefazolin  is susceptible if MIC <32 mcg/mL and predicts susceptible to the oral agents cefaclor, cefdinir, cefpodoxime, cefprozil, cefuroxime, cephalexin  and loracarbef.      CEFTAZIDIME <=1 Sensitive     CEFEPIME <=1 Sensitive     CEFTRIAXONE <=1 Sensitive     CIPROFLOXACIN 1 Resistant     LEVOFLOXACIN 1 Intermediate     GENTAMICIN  >=16 Resistant     IMIPENEM <=0.25 Sensitive     NITROFURANTOIN  <=16 Sensitive     PIP/TAZO <=4 Sensitive     TOBRAMYCIN 8 Intermediate     TRIMETH/SULFA* >=320 Resistant      * For infections other  than uncomplicated UTI caused by E. coli, K. pneumoniae or P. mirabilis: Cefazolin  is resistant if MIC > or = 8 mcg/mL. (Distinguishing susceptible versus intermediate for isolates with MIC < or = 4 mcg/mL requires additional testing.) For uncomplicated UTI caused by E. coli, K. pneumoniae or P. mirabilis: Cefazolin  is susceptible if MIC <32 mcg/mL and predicts susceptible to the oral agents cefaclor, cefdinir, cefpodoxime, cefprozil, cefuroxime, cephalexin  and loracarbef. Legend: S = Susceptible  I = Intermediate R = Resistant  NS = Not susceptible SDD = Susceptible Dose Dependent * = Not Tested  NR = Not Reported **NN = See Therapy Comments    Last metabolic panel Lab Results  Component Value Date   GLUCOSE 89 03/26/2023   NA 140 03/26/2023   K 4.1 03/26/2023   CL 107 03/26/2023   CO2 24 03/26/2023   BUN 12 03/26/2023   CREATININE 0.78 03/26/2023   EGFR 107 03/26/2023   CALCIUM 9.1 03/26/2023   PROT 7.6 03/26/2023   ALBUMIN 4.0 11/01/2022   LABGLOB 3.1 06/23/2020   AGRATIO 1.3 06/23/2020   BILITOT 0.4 03/26/2023   ALKPHOS 85 11/01/2022   AST 16 03/26/2023   ALT 28 03/26/2023   ANIONGAP 9 11/01/2022   Last lipids Lab Results  Component Value Date   CHOL 161 03/26/2023   HDL 42 (L) 03/26/2023   LDLCALC 104 (H) 03/26/2023   TRIG 65 03/26/2023   CHOLHDL 3.8 03/26/2023   Last hemoglobin A1c Lab Results  Component Value Date   HGBA1C 5.8 (H) 03/26/2023        Assessment & Plan:   Problem List Items Addressed This Visit       Cardiovascular and Mediastinum   Primary hypertension     Other    Morbid obesity with BMI of 60.0-69.9, adult (HCC)   Comorbidities include HTN, asthma, depression, anxiety      Relevant Medications   tirzepatide  (ZEPBOUND ) 2.5 MG/0.5ML Pen   Moderate episode of recurrent major depressive disorder (HCC) - Primary   Relevant Medications   sertraline  (ZOLOFT ) 25 MG tablet   Anxiety   Relevant Medications   sertraline  (ZOLOFT ) 25 MG tablet   Other Visit Diagnoses       Allergic rhinitis, unspecified seasonality, unspecified trigger       Relevant Medications   loratadine  (CLARITIN ) 10 MG tablet        Assessment and Plan    Depression and Anxiety Patient reports improvement with Zoloft  25mg  daily. PHQ-9 and GAD-7 scores increased, but patient feels symptoms are more manageable due to medication. -Continue Zoloft  25mg  daily. -Follow-up in 6 months.  Hypertension Blood pressure elevated at 134/90. Patient reports stress due to personal issues. -Monitor blood pressure. -Consider initiation of antihypertensive medication if blood pressure remains elevated.  Weight Management Previous attempts to obtain coverage for weight loss medications (Zepbound , Zaxenda) were denied by insurance. -Resubmit prior authorization for Zepbound  with note including BMI.  Allergies Patient requests prescription for antihistamine. -Prescribe Claritin  or Allegra as requested by patient.        Follow up plan: Return in about 6 months (around 11/19/2023) for follow up.

## 2023-05-22 NOTE — Assessment & Plan Note (Signed)
 Comorbidities include HTN, asthma, depression, anxiety

## 2023-05-24 NOTE — Telephone Encounter (Signed)
 Refilled on 05/22/23, will refuse this request.  Requested Prescriptions  Refused Prescriptions Disp Refills   sertraline  (ZOLOFT ) 25 MG tablet [Pharmacy Med Name: Sertraline  HCl 25 MG Oral Tablet] 30 tablet 0    Sig: Take 1 tablet by mouth once daily     Psychiatry:  Antidepressants - SSRI - sertraline  Passed - 05/24/2023  7:34 PM      Passed - AST in normal range and within 360 days    AST  Date Value Ref Range Status  03/26/2023 16 10 - 30 U/L Final         Passed - ALT in normal range and within 360 days    ALT  Date Value Ref Range Status  03/26/2023 28 6 - 29 U/L Final         Passed - Completed PHQ-2 or PHQ-9 in the last 360 days      Passed - Valid encounter within last 6 months    Recent Outpatient Visits           2 days ago Moderate episode of recurrent major depressive disorder [facility])   [facility] Gareth Clarity F, FNP   1 month ago Mild intermittent asthma without complication   [facility] Gareth Clarity FALCON, FNP   1 month ago Primary hypertension   [facility] Gareth Clarity F, FNP   4 years ago MDD (major depressive disorder), recurrent episode, moderate [facility])   [place] [facility] Lavina Damien BRAVO, FNP   4 years ago Morbid obesity [facility])   [place] [facility] Sowles, Krichna, MD       Future Appointments             In 3 days Gareth, Clarity FALCON, FNP [facility]   In 6 months Gareth, Clarity FALCON, FNP [facility]

## 2023-05-27 ENCOUNTER — Ambulatory Visit: Payer: Medicaid Other | Admitting: Nurse Practitioner

## 2023-05-27 NOTE — Progress Notes (Deleted)
   There were no vitals taken for this visit.   Subjective:    Patient ID: Katherine Huynh, female    DOB: Sep 24, 1995, 28 y.o.   MRN: [id number]  HPI: Katherine Huynh is a 28 y.o. female  No chief complaint on file.   Discussed the use of AI scribe software for clinical note transcription with the patient, who gave verbal consent to proceed.  History of Present Illness           05/22/2023    1:51 PM 04/24/2023    1:57 PM 03/26/2023    8:11 AM  Depression screen PHQ 2/9  Decreased Interest 0 1 1  Down, Depressed, Hopeless 1 1 2   PHQ - 2 Score 1 2 3   Altered sleeping 3 3 3   Tired, decreased energy 3 0 1  Change in appetite 3 3 3   Feeling bad or failure about yourself  0 0 0  Trouble concentrating 0 0 0  Moving slowly or fidgety/restless 0 0 0  Suicidal thoughts 0 0 0  PHQ-9 Score 10 8 10   Difficult doing work/chores Somewhat difficult Very difficult     Relevant past medical, surgical, family and social history reviewed and updated as indicated. Interim medical history since our last visit reviewed. Allergies and medications reviewed and updated.  Review of Systems  Ten systems reviewed and is negative except as mentioned in HPI      Objective:    There were no vitals taken for this visit.  {Vitals History (Optional):23777} Wt Readings from Last 3 Encounters:  05/22/23 (!) 331 lb 8 oz (150.4 kg)  04/25/23 (!) 330 lb (149.7 kg)  04/24/23 (!) 330 lb 4.8 oz (149.8 kg)    Physical Exam  Constitutional: Patient appears well-developed and well-nourished. Obese *** No distress.  HEENT: head atraumatic, normocephalic, pupils equal and reactive to light, ears ***, neck supple, throat within normal limits Cardiovascular: Normal rate, regular rhythm and normal heart sounds.  No murmur heard. No BLE edema. Pulmonary/Chest: Effort normal and breath sounds normal. No respiratory distress. Abdominal: Soft.  There is no tenderness. Psychiatric: Patient has a normal mood and affect.  behavior is normal. Judgment and thought content normal.   {Labs (Optional):23779}    Assessment & Plan:   Problem List Items Addressed This Visit   None    Assessment and Plan             Follow up plan: No follow-ups on file.

## 2023-06-12 ENCOUNTER — Other Ambulatory Visit: Payer: Self-pay | Admitting: Nurse Practitioner

## 2023-06-12 ENCOUNTER — Encounter: Payer: Self-pay | Admitting: Obstetrics and Gynecology

## 2023-06-12 MED ORDER — WEGOVY 0.25 MG/0.5ML ~~LOC~~ SOAJ: 0.2500 mg | SUBCUTANEOUS | 0 refills | Status: DC

## 2023-06-26 ENCOUNTER — Other Ambulatory Visit: Payer: Self-pay | Admitting: Nurse Practitioner

## 2023-06-26 MED ORDER — TIRZEPATIDE-WEIGHT MANAGEMENT 2.5 MG/0.5ML ~~LOC~~ SOLN: 2.5000 mg | SUBCUTANEOUS | 0 refills | Status: DC

## 2023-07-02 ENCOUNTER — Encounter: Payer: Self-pay | Admitting: Nurse Practitioner

## 2023-07-03 ENCOUNTER — Telehealth (INDEPENDENT_AMBULATORY_CARE_PROVIDER_SITE_OTHER): Payer: Medicaid Other | Admitting: Nurse Practitioner

## 2023-07-03 DIAGNOSIS — F419 Anxiety disorder, unspecified: Secondary | ICD-10-CM

## 2023-07-03 DIAGNOSIS — F331 Major depressive disorder, recurrent, moderate: Secondary | ICD-10-CM | POA: Diagnosis not present

## 2023-07-03 MED ORDER — BUPROPION HCL ER (XL) 150 MG PO TB24
150.0000 mg | ORAL_TABLET | Freq: Every day | ORAL | 0 refills | Status: DC
Start: 2023-07-03 — End: 2023-11-06

## 2023-07-03 NOTE — Progress Notes (Signed)
Name: Katherine Huynh   MRN: [id number]    DOB: [birth year]   Date:07/03/2023       Progress Note  Subjective  Chief Complaint  Chief Complaint  Patient presents with   Fussy    Pt states has been crying for no reason for about 2 weeks    I connected with  [name]  on 07/03/23 at  9:00 AM EST by a video enabled telemedicine application and verified that I am speaking with the correct person using two identifiers.  I discussed the limitations of evaluation and management by telemedicine and the availability of in person appointments. The patient expressed understanding and agreed to proceed with a virtual visit  Staff also discussed with the patient that there may be a patient responsible charge related to this service. Patient Location: home Provider Location: [facility] Additional Individuals present: alone  HPI  Discussed the use of AI scribe software for clinical note transcription with the patient, who gave verbal consent to proceed.  History of Present Illness   The patient, with a history of depression and anxiety managed with Zoloft 25mg  daily, reports a new onset of frequent crying episodes over the past two weeks. She describes this as a change from her usual emotional response, stating, "Instead of feeling anger, I just cry." denies any thoughts of suicide.  Despite these new symptoms, she wishes to continue Zoloft due to its perceived benefits.       07/03/2023    8:04 AM 05/22/2023    1:51 PM 04/24/2023    1:57 PM 03/26/2023    8:11 AM 05/17/2022   12:59 PM  Depression screen PHQ 2/9  Decreased Interest 0 0 1 1 0  Down, Depressed, Hopeless 0 1 1 2  0  PHQ - 2 Score 0 1 2 3  0  Altered sleeping 0 3 3 3  0  Tired, decreased energy 0 3 0 1 0  Change in appetite 0 3 3 3  0  Feeling bad or failure about yourself  0 0 0 0 0  Trouble concentrating 0 0 0 0 0  Moving slowly or fidgety/restless 0 0 0 0 0  Suicidal thoughts 0 0 0 0 0  PHQ-9 Score 0 10 8 10  0  Difficult doing  work/chores Not difficult at all Somewhat difficult Very difficult         07/03/2023    8:04 AM 05/22/2023    1:52 PM 04/24/2023    1:58 PM 03/26/2023    8:12 AM  GAD 7 : Generalized Anxiety Score  Nervous, Anxious, on Edge 0 2 0 0  Control/stop worrying 0 2 2 0  Worry too much - different things 0 3 2 0  Trouble relaxing 0 2 3 0  Restless 0 0 0 0  Easily annoyed or irritable 0 1 3 0  Afraid - awful might happen 0 1 0 0  Total GAD 7 Score 0 11 10 0  Anxiety Difficulty Not difficult at all Somewhat difficult Extremely difficult Not difficult at all     Patient Active Problem List   Diagnosis Date Noted   Anxiety 05/22/2023   Mild intermittent asthma without complication 03/26/2023   Status post C-section 10/04/2022   Chronic hypertension with superimposed pre-eclampsia 09/30/2022   Chronic hypertension with exacerbation during pregnancy in third trimester 09/28/2022   Group B Streptococcus carrier, +RV culture, currently pregnant 09/14/2022   Obesity in pregnancy, antepartum 07/18/2022   Heartburn in pregnancy in second trimester 05/12/2022  Primary hypertension 06/23/2020   Pure hypercholesterolemia 06/23/2020   Severe dysmenorrhea 05/30/2018   Moderate episode of recurrent major depressive disorder (HCC) 05/30/2018   Murmur, cardiac 10/12/2016   Polycystic disease, ovaries 10/02/2016   Depression, major, recurrent (HCC) 02/17/2016   Morbid obesity with BMI of 60.0-69.9, adult (HCC) 02/17/2016   Difficulty controlling anger 02/17/2016   Athlete's foot, left 10/10/2015   Onychomycosis 09/15/2015   Fam hx-ischem heart disease 09/15/2015    Social History   Tobacco Use   Smoking status: Former    Current packs/day: 0.00    Types: Cigarettes    Quit date: 09/28/2016    Years since quitting: 6.7   Smokeless tobacco: Never  Substance Use Topics   Alcohol use: Yes    Alcohol/week: 6.0 standard drinks of alcohol    Types: 3 Glasses of wine, 2 Cans of beer, 1 Shots of  liquor per week    Comment: occas     Current Outpatient Medications:    albuterol (VENTOLIN HFA) 108 (90 Base) MCG/ACT inhaler, Inhale 1-2 puffs into the lungs every 6 (six) hours as needed for wheezing or shortness of breath., Disp: 18 g, Rfl: 5   Albuterol-Budesonide (AIRSUPRA) 90-80 MCG/ACT AERO, Inhale 1-2 puffs into the lungs as needed (wheezing)., Disp: 10 g, Rfl: 5   diphenhydrAMINE (BENADRYL) 25 mg capsule, Take 1 capsule by mouth as needed., Disp: , Rfl:    ibuprofen (ADVIL) 600 MG tablet, Take 1 tablet (600 mg total) by mouth every 6 (six) hours., Disp: 30 tablet, Rfl: 0   loratadine (CLARITIN) 10 MG tablet, Take 1 tablet (10 mg total) by mouth daily., Disp: 90 tablet, Rfl: 3   medroxyPROGESTERone (DEPO-PROVERA) 150 MG/ML injection, Inject 1 mL (150 mg total) into the muscle every 3 (three) months., Disp: 1 mL, Rfl: 3   Mometasone Furo-Formoterol Fum (DULERA) 50-5 MCG/ACT AERO, Inhale 2 puffs into the lungs 2 (two) times daily., Disp: 13 g, Rfl: 3   naloxone (NARCAN) nasal spray 4 mg/0.1 mL, SMARTSIG:1 Both Nares Daily, Disp: , Rfl:    nitrofurantoin, macrocrystal-monohydrate, (MACROBID) 100 MG capsule, Take 1 capsule (100 mg total) by mouth 2 (two) times daily., Disp: 10 capsule, Rfl: 0   sertraline (ZOLOFT) 25 MG tablet, Take 1 tablet (25 mg total) by mouth daily., Disp: 90 tablet, Rfl: 1   tirzepatide (ZEPBOUND) 2.5 MG/0.5ML injection vial, Inject 2.5 mg into the skin once a week., Disp: 2 mL, Rfl: 0   buPROPion (WELLBUTRIN XL) 150 MG 24 hr tablet, Take 1 tablet (150 mg total) by mouth daily., Disp: 30 tablet, Rfl: 0  Allergies  Allergen Reactions   Cinnamon Itching   Orange (Diagnostic) Hives   Bonjesta [Doxylamine-Pyridoxine] Hives and Itching   Erythromycin Rash   Sulfa Antibiotics Rash    I personally reviewed active problem list, medication list, allergies, notes from last encounter with the patient/caregiver today.  ROS  Constitutional: Negative for fever or weight  change.  Respiratory: Negative for cough and shortness of breath.   Cardiovascular: Negative for chest pain or palpitations.  Gastrointestinal: Negative for abdominal pain, no bowel changes.  Musculoskeletal: Negative for gait problem or joint swelling.  Skin: Negative for rash.  Neurological: Negative for dizziness or headache.  No other specific complaints in a complete review of systems (except as listed in HPI above).   Objective  Virtual encounter, vitals not obtained.  There is no height or weight on file to calculate BMI.  Nursing Note and Vital Signs reviewed.  Physical  Exam  Awake, alert and oriented, speaking in complete sentences   No results found for this or any previous visit (from the past 72 hours).  Assessment & Plan  Assessment and Plan    Depression and Anxiety Increased crying episodes despite adherence to Zoloft 25mg  daily. Discussed the possibility of an imbalance between anxiety and depression control. Patient prefers to add on to current regimen rather than switch medications. -Add Wellbutrin 150mg  daily. -Schedule virtual follow-up appointment in 4 weeks to assess response to new medication.         -Red flags and when to present for emergency care or RTC including fever >101.68F, chest pain, shortness of breath, new/worsening/un-resolving symptoms,  reviewed with patient at time of visit. Follow up and care instructions discussed and provided in AVS. - I discussed the assessment and treatment plan with the patient. The patient was provided an opportunity to ask questions and all were answered. The patient agreed with the plan and demonstrated an understanding of the instructions.  I provided 20 minutes of non-face-to-face time during this encounter.  Berniece Salines, FNP

## 2023-07-13 ENCOUNTER — Encounter: Payer: Self-pay | Admitting: Nurse Practitioner

## 2023-07-15 ENCOUNTER — Ambulatory Visit: Payer: Medicaid Other

## 2023-07-15 ENCOUNTER — Other Ambulatory Visit: Payer: Self-pay | Admitting: Nurse Practitioner

## 2023-07-15 VITALS — BP 136/87 | HR 103 | Ht 63.0 in | Wt 330.0 lb

## 2023-07-15 DIAGNOSIS — Z3042 Encounter for surveillance of injectable contraceptive: Secondary | ICD-10-CM | POA: Diagnosis not present

## 2023-07-15 MED ORDER — MEDROXYPROGESTERONE ACETATE 150 MG/ML IM SUSP
150.0000 mg | Freq: Once | INTRAMUSCULAR | Status: AC
  Administered 2023-07-15: 150 mg via INTRAMUSCULAR

## 2023-07-15 MED ORDER — TIRZEPATIDE-WEIGHT MANAGEMENT 5 MG/0.5ML ~~LOC~~ SOLN: 5.0000 mg | SUBCUTANEOUS | 0 refills | Status: DC

## 2023-07-15 NOTE — Progress Notes (Cosign Needed Addendum)
    NURSE VISIT NOTE  Subjective:    Patient ID: Katherine Huynh, female    DOB: Jul 06, 1995, 28 y.o.   MRN: [id number]  HPI  Patient is a 28 y.o. G80P1021 female who presents for depo provera injection.   Objective:    BP 136/87   Pulse (!) 103   Ht 5\' 3"  (1.6 m)   Wt (!) 330 lb (149.7 kg)   Breastfeeding No   BMI 58.46 kg/m   Last Annual: 01/04/21, due for annual. Last pap: 11/15/20. Last Depo-Provera: 04/25/23. Side Effects if any: n/a. Serum HCG indicated? N/a. Depo-Provera 150 mg IM given by: Georgiana Shore, CMA. Site: Right Deltoid    Assessment:   1. Encounter for Depo-Provera contraception      Plan:   Next appointment due between 09/30/23 and 10/14/23.    Loman Chroman, CMA

## 2023-07-17 ENCOUNTER — Other Ambulatory Visit: Payer: Self-pay | Admitting: Nurse Practitioner

## 2023-07-17 MED ORDER — TIRZEPATIDE-WEIGHT MANAGEMENT 5 MG/0.5ML ~~LOC~~ SOAJ: 5.0000 mg | SUBCUTANEOUS | 0 refills | Status: DC

## 2023-07-18 ENCOUNTER — Ambulatory Visit: Payer: Medicaid Other

## 2023-07-23 ENCOUNTER — Telehealth: Payer: Self-pay

## 2023-07-23 NOTE — Telephone Encounter (Signed)
 PA started through cover my meds

## 2023-07-23 NOTE — Telephone Encounter (Signed)
 Prior Auth for Zepbound on   tirzepatide (ZEPBOUND) 5 MG/0.5ML Pen   Key: [id number]

## 2023-08-21 ENCOUNTER — Other Ambulatory Visit: Payer: Self-pay | Admitting: Nurse Practitioner

## 2023-08-21 MED ORDER — TIRZEPATIDE-WEIGHT MANAGEMENT 7.5 MG/0.5ML ~~LOC~~ SOAJ: 7.5000 mg | SUBCUTANEOUS | 0 refills | Status: DC

## 2023-08-23 ENCOUNTER — Telehealth: Payer: Self-pay | Admitting: Nurse Practitioner

## 2023-08-23 NOTE — Telephone Encounter (Signed)
 PA started through cover my meds

## 2023-08-23 NOTE — Telephone Encounter (Signed)
 [employer]eds  tirzepatide (ZEPBOUND) 5 MG/0.5ML Pen   Key: [id number]

## 2023-09-03 NOTE — Progress Notes (Unsigned)
 There were no vitals taken for this visit.   Subjective:    Patient ID: Katherine Huynh, female    DOB: [birth year], 28 y.o.   MRN: [id number]  HPI: Katherine Huynh is a 28 y.o. female  No chief complaint on file.   Discussed the use of AI scribe software for clinical note transcription with the patient, who gave verbal consent to proceed.  History of Present Illness          07/03/2023    8:04 AM 05/22/2023    1:51 PM 04/24/2023    1:57 PM  Depression screen PHQ 2/9  Decreased Interest 0 0 1  Down, Depressed, Hopeless 0 1 1  PHQ - 2 Score 0 1 2  Altered sleeping 0 3 3  Tired, decreased energy 0 3 0  Change in appetite 0 3 3  Feeling bad or failure about yourself  0 0 0  Trouble concentrating 0 0 0  Moving slowly or fidgety/restless 0 0 0  Suicidal thoughts 0 0 0  PHQ-9 Score 0 10 8  Difficult doing work/chores Not difficult at all Somewhat difficult Very difficult    Relevant past medical, surgical, family and social history reviewed and updated as indicated. Interim medical history since our last visit reviewed. Allergies and medications reviewed and updated.  Review of Systems  Per HPI unless specifically indicated above     Objective:    There were no vitals taken for this visit.  {Vitals History (Optional):23777} Wt Readings from Last 3 Encounters:  07/15/23 (!) 330 lb (149.7 kg)  05/22/23 (!) 331 lb 8 oz (150.4 kg)  04/25/23 (!) 330 lb (149.7 kg)    Physical Exam Physical Exam    Results for orders placed or performed in visit on 04/24/23  POCT urine pregnancy   Collection Time: 04/24/23  2:01 PM  Result Value Ref Range   Preg Test, Ur Negative Negative  POCT Urinalysis Dipstick   Collection Time: 04/24/23  2:03 PM  Result Value Ref Range   Color, UA dark yellow    Clarity, UA turbid    Glucose, UA Negative Negative   Bilirubin, UA Negative    Ketones, UA Negative    Spec Grav, UA >=1.030 (A) 1.010 - 1.025   Blood, UA Large    pH, UA 6.0 5.0 -  8.0   Protein, UA Positive (A) Negative   Urobilinogen, UA 0.2 0.2 or 1.0 E.U./dL   Nitrite, UA Negative    Leukocytes, UA Trace (A) Negative   Appearance Cloudy/bloody    Odor Iron   Urine Culture   Collection Time: 04/24/23  3:38 PM   Specimen: Urine  Result Value Ref Range   MICRO NUMBER: [id number]    SPECIMEN QUALITY: Adequate    Sample Source URINE    STATUS: FINAL    ISOLATE 1: Escherichia coli (A)       Susceptibility   Escherichia coli - URINE CULTURE, REFLEX    AMOX/CLAVULANIC 16 Intermediate     AMPICILLIN  >=32 Resistant     AMPICILLIN /SULBACTAM 16 Intermediate     CEFAZOLIN * <=4 Not Reportable      * For infections other than uncomplicated UTI caused by E. coli, K. pneumoniae or P. mirabilis: Cefazolin  is resistant if MIC > or = 8 mcg/mL. (Distinguishing susceptible versus intermediate for isolates with MIC < or = 4 mcg/mL requires additional testing.) For uncomplicated UTI caused by E. coli, K. pneumoniae or P. mirabilis: Cefazolin  is susceptible if MIC <32 mcg/mL  and predicts susceptible to the oral agents cefaclor, cefdinir, cefpodoxime, cefprozil, cefuroxime, cephalexin  and loracarbef.     CEFTAZIDIME <=1 Sensitive     CEFEPIME <=1 Sensitive     CEFTRIAXONE <=1 Sensitive     CIPROFLOXACIN 1 Resistant     LEVOFLOXACIN 1 Intermediate     GENTAMICIN  >=16 Resistant     IMIPENEM <=0.25 Sensitive     NITROFURANTOIN  <=16 Sensitive     PIP/TAZO <=4 Sensitive     TOBRAMYCIN 8 Intermediate     TRIMETH/SULFA* >=320 Resistant      * For infections other than uncomplicated UTI caused by E. coli, K. pneumoniae or P. mirabilis: Cefazolin  is resistant if MIC > or = 8 mcg/mL. (Distinguishing susceptible versus intermediate for isolates with MIC < or = 4 mcg/mL requires additional testing.) For uncomplicated UTI caused by E. coli, K. pneumoniae or P. mirabilis: Cefazolin  is susceptible if MIC <32 mcg/mL and predicts susceptible to the oral agents cefaclor,  cefdinir, cefpodoxime, cefprozil, cefuroxime, cephalexin  and loracarbef. Legend: S = Susceptible  I = Intermediate R = Resistant  NS = Not susceptible SDD = Susceptible Dose Dependent * = Not Tested  NR = Not Reported **NN = See Therapy Comments    {Labs (Optional):23779}    Assessment & Plan:   Problem List Items Addressed This Visit   None    Assessment and Plan Assessment & Plan         Follow up plan: No follow-ups on file.

## 2023-09-04 ENCOUNTER — Encounter: Payer: Self-pay | Admitting: Nurse Practitioner

## 2023-09-04 ENCOUNTER — Ambulatory Visit: Admitting: Nurse Practitioner

## 2023-09-04 ENCOUNTER — Telehealth: Payer: Self-pay

## 2023-09-04 VITALS — BP 126/78 | HR 90 | Temp 98.5°F | Resp 18 | Ht 63.0 in | Wt 316.4 lb

## 2023-09-04 DIAGNOSIS — Z6841 Body Mass Index (BMI) 40.0 and over, adult: Secondary | ICD-10-CM | POA: Diagnosis not present

## 2023-09-04 DIAGNOSIS — E66813 Obesity, class 3: Secondary | ICD-10-CM

## 2023-09-04 DIAGNOSIS — J4540 Moderate persistent asthma, uncomplicated: Secondary | ICD-10-CM

## 2023-09-04 DIAGNOSIS — R053 Chronic cough: Secondary | ICD-10-CM

## 2023-09-04 MED ORDER — DULERA 50-5 MCG/ACT IN AERO: 2.0000 | INHALATION_SPRAY | Freq: Two times a day (BID) | RESPIRATORY_TRACT | 3 refills | Status: DC

## 2023-09-04 MED ORDER — ZEPBOUND 10 MG/0.5ML ~~LOC~~ SOAJ: 10.0000 mg | SUBCUTANEOUS | 0 refills | Status: DC

## 2023-09-04 NOTE — Assessment & Plan Note (Signed)
 Doing well on zepbound , next dose sent in

## 2023-09-04 NOTE — Assessment & Plan Note (Signed)
 Patient instructed to use Dulera  BID for maintenance, and Airsupra  PRN for rescue inhaler, Albuterol  discontinued. Walking pulse ox performed during visit = 97-99%

## 2023-09-04 NOTE — Telephone Encounter (Signed)
 Prior Auth on   Mometasone Furo-Formoterol Fum (DULERA ) 50-5 MCG/ACT AERO   Key [id number]

## 2023-09-05 ENCOUNTER — Other Ambulatory Visit: Payer: Self-pay | Admitting: Maternal & Fetal Medicine

## 2023-09-05 DIAGNOSIS — O99213 Obesity complicating pregnancy, third trimester: Secondary | ICD-10-CM

## 2023-09-05 DIAGNOSIS — Z8679 Personal history of other diseases of the circulatory system: Secondary | ICD-10-CM

## 2023-09-05 DIAGNOSIS — E282 Polycystic ovarian syndrome: Secondary | ICD-10-CM

## 2023-09-05 NOTE — Telephone Encounter (Signed)
 PA started

## 2023-09-11 ENCOUNTER — Ambulatory Visit: Admitting: Nurse Practitioner

## 2023-09-11 NOTE — Progress Notes (Deleted)
 There were no vitals taken for this visit.   Subjective:    Patient ID: Katherine Huynh, female    DOB: 1995-10-26, 28 y.o.   MRN: [id number]  HPI: Katherine Huynh is a 28 y.o. female  No chief complaint on file.   Discussed the use of AI scribe software for clinical note transcription with the patient, who gave verbal consent to proceed.  History of Present Illness          07/03/2023    8:04 AM 05/22/2023    1:51 PM 04/24/2023    1:57 PM  Depression screen PHQ 2/9  Decreased Interest 0 0 1  Down, Depressed, Hopeless 0 1 1  PHQ - 2 Score 0 1 2  Altered sleeping 0 3 3  Tired, decreased energy 0 3 0  Change in appetite 0 3 3  Feeling bad or failure about yourself  0 0 0  Trouble concentrating 0 0 0  Moving slowly or fidgety/restless 0 0 0  Suicidal thoughts 0 0 0  PHQ-9 Score 0 10 8  Difficult doing work/chores Not difficult at all Somewhat difficult Very difficult    Relevant past medical, surgical, family and social history reviewed and updated as indicated. Interim medical history since our last visit reviewed. Allergies and medications reviewed and updated.  Review of Systems  Per HPI unless specifically indicated above     Objective:    There were no vitals taken for this visit.  {Vitals History (Optional):23777} Wt Readings from Last 3 Encounters:  09/04/23 (!) 316 lb 6.4 oz (143.5 kg)  07/15/23 (!) 330 lb (149.7 kg)  05/22/23 (!) 331 lb 8 oz (150.4 kg)    Physical Exam Physical Exam    Results for orders placed or performed in visit on 04/24/23  POCT urine pregnancy   Collection Time: 04/24/23  2:01 PM  Result Value Ref Range   Preg Test, Ur Negative Negative  POCT Urinalysis Dipstick   Collection Time: 04/24/23  2:03 PM  Result Value Ref Range   Color, UA dark yellow    Clarity, UA turbid    Glucose, UA Negative Negative   Bilirubin, UA Negative    Ketones, UA Negative    Spec Grav, UA >=1.030 (A) 1.010 - 1.025   Blood, UA Large    pH, UA 6.0  5.0 - 8.0   Protein, UA Positive (A) Negative   Urobilinogen, UA 0.2 0.2 or 1.0 E.U./dL   Nitrite, UA Negative    Leukocytes, UA Trace (A) Negative   Appearance Cloudy/bloody    Odor Iron   Urine Culture   Collection Time: 04/24/23  3:38 PM   Specimen: Urine  Result Value Ref Range   MICRO NUMBER: [id number]    SPECIMEN QUALITY: Adequate    Sample Source URINE    STATUS: FINAL    ISOLATE 1: Escherichia coli (A)       Susceptibility   Escherichia coli - URINE CULTURE, REFLEX    AMOX/CLAVULANIC 16 Intermediate     AMPICILLIN  >=32 Resistant     AMPICILLIN /SULBACTAM 16 Intermediate     CEFAZOLIN * <=4 Not Reportable      * For infections other than uncomplicated UTI caused by E. coli, K. pneumoniae or P. mirabilis: Cefazolin  is resistant if MIC > or = 8 mcg/mL. (Distinguishing susceptible versus intermediate for isolates with MIC < or = 4 mcg/mL requires additional testing.) For uncomplicated UTI caused by E. coli, K. pneumoniae or P. mirabilis: Cefazolin  is susceptible if MIC <  32 mcg/mL and predicts susceptible to the oral agents cefaclor, cefdinir, cefpodoxime, cefprozil, cefuroxime, cephalexin  and loracarbef.     CEFTAZIDIME <=1 Sensitive     CEFEPIME <=1 Sensitive     CEFTRIAXONE <=1 Sensitive     CIPROFLOXACIN 1 Resistant     LEVOFLOXACIN 1 Intermediate     GENTAMICIN  >=16 Resistant     IMIPENEM <=0.25 Sensitive     NITROFURANTOIN  <=16 Sensitive     PIP/TAZO <=4 Sensitive     TOBRAMYCIN 8 Intermediate     TRIMETH/SULFA* >=320 Resistant      * For infections other than uncomplicated UTI caused by E. coli, K. pneumoniae or P. mirabilis: Cefazolin  is resistant if MIC > or = 8 mcg/mL. (Distinguishing susceptible versus intermediate for isolates with MIC < or = 4 mcg/mL requires additional testing.) For uncomplicated UTI caused by E. coli, K. pneumoniae or P. mirabilis: Cefazolin  is susceptible if MIC <32 mcg/mL and predicts susceptible to the oral agents cefaclor,  cefdinir, cefpodoxime, cefprozil, cefuroxime, cephalexin  and loracarbef. Legend: S = Susceptible  I = Intermediate R = Resistant  NS = Not susceptible SDD = Susceptible Dose Dependent * = Not Tested  NR = Not Reported **NN = See Therapy Comments    {Labs (Optional):23779}    Assessment & Plan:   Problem List Items Addressed This Visit   None    Assessment and Plan Assessment & Plan         Follow up plan: No follow-ups on file.

## 2023-10-03 ENCOUNTER — Ambulatory Visit: Admitting: Nurse Practitioner

## 2023-10-03 ENCOUNTER — Encounter: Payer: Self-pay | Admitting: Nurse Practitioner

## 2023-10-03 VITALS — BP 132/74 | HR 98 | Temp 98.3°F | Ht 63.0 in | Wt 309.8 lb

## 2023-10-03 DIAGNOSIS — F99 Mental disorder, not otherwise specified: Secondary | ICD-10-CM | POA: Diagnosis not present

## 2023-10-03 DIAGNOSIS — F331 Major depressive disorder, recurrent, moderate: Secondary | ICD-10-CM

## 2023-10-03 DIAGNOSIS — F5105 Insomnia due to other mental disorder: Secondary | ICD-10-CM

## 2023-10-03 MED ORDER — TRAZODONE HCL 50 MG PO TABS: 25.0000 mg | ORAL_TABLET | Freq: Every evening | ORAL | 0 refills | Status: DC | PRN

## 2023-10-03 MED ORDER — SERTRALINE HCL 50 MG PO TABS: 50.0000 mg | ORAL_TABLET | Freq: Every day | ORAL | Status: DC

## 2023-10-03 NOTE — Progress Notes (Signed)
 BP 132/74 (Patient Position: Sitting, Cuff Size: Normal)   Pulse 98   Temp 98.3 F (36.8 C) (Oral)   Ht 5\' 3"  (1.6 m)   Wt (!) 309 lb 12.8 oz (140.5 kg)   LMP 09/26/2023 (Exact Date)   SpO2 97%   BMI 54.88 kg/m    Subjective:    Patient ID: Katherine Huynh, female    DOB: [birth year], 28 y.o.   MRN: [id number]  HPI: Katherine Huynh is a 28 y.o. female  Chief Complaint  Patient presents with   medical managment    Discuss antidepressant medications, states she needs more help    Discussed the use of AI scribe software for clinical note transcription with the patient, who gave verbal consent to proceed.  History of Present Illness Katherine Huynh is a 28 year old female with depression who presents with worsening symptoms.  She has been experiencing worsening symptoms of depression, particularly following a major death in her family. Her mood has been significantly affected by these recent events, and she describes her current state as having 'been better'.  She has been taking Zoloft  25 mg every day and has a three-month supply at home. She reports no significant side effects from Zoloft  and is open to adjusting the dosage. She mentions that she does not take Wellbutrin  as often as before.  She reports significant difficulty sleeping, stating she is 'not at all' able to sleep well. This has been a persistent issue impacting her daily functioning.  No major side effects from her current medication regimen.         10/03/2023    1:22 PM 07/03/2023    8:04 AM 05/22/2023    1:51 PM  Depression screen PHQ 2/9  Decreased Interest 1 0 0  Down, Depressed, Hopeless 3 0 1  PHQ - 2 Score 4 0 1  Altered sleeping 3 0 3  Tired, decreased energy 3 0 3  Change in appetite 3 0 3  Feeling bad or failure about yourself  3 0 0  Trouble concentrating 3 0 0  Moving slowly or fidgety/restless 0 0 0  Suicidal thoughts 1 0 0  PHQ-9 Score 20 0 10  Difficult doing work/chores Extremely dIfficult Not  difficult at all Somewhat difficult       10/03/2023    1:23 PM 07/03/2023    8:04 AM 05/22/2023    1:52 PM 04/24/2023    1:58 PM  GAD 7 : Generalized Anxiety Score  Nervous, Anxious, on Edge 3 0 2 0  Control/stop worrying 2 0 2 2  Worry too much - different things 3 0 3 2  Trouble relaxing 2 0 2 3  Restless 1 0 0 0  Easily annoyed or irritable 0 0 1 3  Afraid - awful might happen 3 0 1 0  Total GAD 7 Score 14 0 11 10  Anxiety Difficulty Very difficult Not difficult at all Somewhat difficult Extremely difficult     Relevant past medical, surgical, family and social history reviewed and updated as indicated. Interim medical history since our last visit reviewed. Allergies and medications reviewed and updated.  Review of Systems  Constitutional: Negative for fever or weight change.  Respiratory: Negative for cough and shortness of breath.   Cardiovascular: Negative for chest pain or palpitations.  Gastrointestinal: Negative for abdominal pain, no bowel changes.  Musculoskeletal: Negative for gait problem or joint swelling.  Skin: Negative for rash.  Neurological: Negative for dizziness or headache.  No  other specific complaints in a complete review of systems (except as listed in HPI above).      Objective:      BP 132/74 (Patient Position: Sitting, Cuff Size: Normal)   Pulse 98   Temp 98.3 F (36.8 C) (Oral)   Ht 5\' 3"  (1.6 m)   Wt (!) 309 lb 12.8 oz (140.5 kg)   LMP 09/26/2023 (Exact Date)   SpO2 97%   BMI 54.88 kg/m    Wt Readings from Last 3 Encounters:  10/03/23 (!) 309 lb 12.8 oz (140.5 kg)  09/04/23 (!) 316 lb 6.4 oz (143.5 kg)  07/15/23 (!) 330 lb (149.7 kg)    Physical Exam Vitals reviewed.  Constitutional:      Appearance: Normal appearance.  HENT:     Head: Normocephalic.  Cardiovascular:     Rate and Rhythm: Normal rate and regular rhythm.  Pulmonary:     Effort: Pulmonary effort is normal.     Breath sounds: Normal breath sounds.   Musculoskeletal:        General: Normal range of motion.  Skin:    General: Skin is warm and dry.  Neurological:     General: No focal deficit present.     Mental Status: She is alert and oriented to person, place, and time. Mental status is at baseline.  Psychiatric:        Mood and Affect: Mood normal.        Behavior: Behavior normal.        Thought Content: Thought content normal.        Judgment: Judgment normal.                Assessment & Plan:   Problem List Items Addressed This Visit       Other   Moderate episode of recurrent major depressive disorder (HCC)   Relevant Medications   sertraline  (ZOLOFT ) 50 MG tablet   traZODone  (DESYREL ) 50 MG tablet   Other Visit Diagnoses       Insomnia due to other mental disorder    -  Primary   Relevant Medications   traZODone  (DESYREL ) 50 MG tablet        Assessment and Plan Assessment & Plan Depression Depression exacerbated by recent family bereavement. Currently on Zoloft  25 mg nightly with no significant side effects. Inconsistent adherence to Wellbutrin . Reports poor sleep quality. - Increase Zoloft  to 50 mg daily  -recommend taking wellbutrin  daily - Prescribe trazodone  for sleep, starting with half a tablet at bedtime. If insufficient, increase to a whole tablet. - Schedule follow-up in four weeks to assess response to medication adjustment, either in person or virtually.        Follow up plan: Return in about 4 weeks (around 10/31/2023) for follow up.

## 2023-10-04 ENCOUNTER — Ambulatory Visit

## 2023-10-04 ENCOUNTER — Ambulatory Visit (INDEPENDENT_AMBULATORY_CARE_PROVIDER_SITE_OTHER)

## 2023-10-04 VITALS — BP 122/84 | HR 80 | Ht 63.0 in | Wt 312.5 lb

## 2023-10-04 DIAGNOSIS — Z3042 Encounter for surveillance of injectable contraceptive: Secondary | ICD-10-CM

## 2023-10-04 MED ORDER — MEDROXYPROGESTERONE ACETATE 150 MG/ML IM SUSP
150.0000 mg | Freq: Once | INTRAMUSCULAR | Status: AC
  Administered 2023-10-04: 150 mg via INTRAMUSCULAR

## 2023-10-04 NOTE — Progress Notes (Signed)
    NURSE VISIT NOTE  Subjective:    Patient ID: Shanon Seawright, female    DOB: 1995-09-28, 28 y.o.   MRN: [id number]  HPI  Patient is a 28 y.o. G35P1021 female who presents for depo provera  injection.   Objective:    BP 122/84   Pulse 80   Ht 5\' 3"  (1.6 m)   Wt (!) 312 lb 8 oz (141.7 kg)   LMP 09/26/2023 (Exact Date)   BMI 55.36 kg/m   Last Annual: 01/04/21.scheduled for 10/10/23 Last pap: 11/15/20. Last Depo-Provera : 07/15/23. Side Effects if any: no. Serum HCG indicated? No . Depo-Provera  150 mg IM given by: Woody Heading, CMA. Site: Left Deltoid    Assessment:   1. Encounter for Depo-Provera  contraception      Plan:   Next appointment due between 12/20/23 and 01/03/24.    Vale Garrison, CMA

## 2023-10-08 ENCOUNTER — Encounter: Payer: Self-pay | Admitting: Nurse Practitioner

## 2023-10-09 ENCOUNTER — Other Ambulatory Visit: Payer: Self-pay | Admitting: Nurse Practitioner

## 2023-10-09 DIAGNOSIS — E66813 Obesity, class 3: Secondary | ICD-10-CM

## 2023-10-09 MED ORDER — ZEPBOUND 12.5 MG/0.5ML ~~LOC~~ SOAJ: 12.5000 mg | SUBCUTANEOUS | 0 refills | Status: DC

## 2023-10-09 NOTE — Patient Instructions (Incomplete)

## 2023-10-09 NOTE — Progress Notes (Unsigned)
 GYNECOLOGY ANNUAL PHYSICAL EXAM NOTE  Subjective:    Katherine Huynh is a 28 y.o. G40P1021 female who presents for an annual exam.  The patient {is/is not/has never been:13135} sexually active. The patient participates in regular exercise: {yes/no/not asked:9010}. Has the patient ever been transfused or tattooed?: {yes/no/not asked:9010}. The patient reports that there {is/is not:9024} domestic violence in her life.   The patient has the following complaints today: ***  Menstrual History: Menarche age: *** Patient's last menstrual period was 09/26/2023 (exact date).     Gynecologic History:  Contraception: {method:5051} History of STI's:  Last Pap: 11/15/20. Results were: normal.  ***Denies/Notes h/o abnormal pap smears.        OB History  Gravida Para Term Preterm AB Living  3 1 1  0 2 1  SAB IAB Ectopic Multiple Live Births  2 0 0 0 1    # Outcome Date GA Lbr Len/2nd Weight Sex Type Anes PTL Lv  3 Term 09/29/22 [redacted]w[redacted]d  7 lb 1.2 oz (3.21 kg) F CS-LTranv Spinal  LIV     Name: CANIYA, TAGLE     Apgar1: 8  Apgar5: 9  2 SAB 2019          1 SAB 2017            Past Medical History:  Diagnosis Date   Anemia    Anxiety    Asthma    Bilateral swelling of feet    Constipation    Depression    Fam hx-ischem heart disease 09/15/2015   Heart murmur    Infertility, female    Morbid obesity (HCC) 02/17/2016   Multiple food allergies    Other fatigue    Polycystic disease, ovaries    Prediabetes    Shortness of breath on exertion    Vitamin D  deficiency     Past Surgical History:  Procedure Laterality Date   CESAREAN SECTION  09/29/2022   Procedure: CESAREAN SECTION;  Surgeon: Teresa Fender, MD;  Location: [facility];  Service: Obstetrics;;   DG HYSTEROGRAM (HSD)  01/13/2020       HYSTEROSCOPY WITH D & C N/A 11/07/2020   Procedure: DILATATION AND CURETTAGE Lorraine Roses;  Surgeon: Teresa Fender, MD;  Location: [facility];  Service: Gynecology;  Laterality: N/A;    tooth implant Right 02/11/2022    Family History  Problem Relation Age of Onset   Obesity Mother    Polycystic ovary syndrome Mother    Hypertension Father    Hyperlipidemia Father    Sleep apnea Father    Obesity Father    Alcohol abuse Sister    ADD / ADHD Sister    Cancer Maternal Grandmother        breast   Fibroids Maternal Grandmother    Uterine cancer Maternal Grandmother 75   Heart disease Maternal Grandfather    Heart disease Paternal Grandfather     Social History   Socioeconomic History   Marital status: Single    Spouse name: [name]    Number of children: 1   Years of education: 14   Highest education level: High school graduate  Occupational History   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  Tobacco Use   Smoking status: Former    Current packs/day: 0.00    Types: Cigarettes    Quit date: 09/28/2016    Years since quitting: 7.0   Smokeless tobacco: Never  Vaping Use   Vaping status: Former   Quit date: 10/14/2021  Substance and Sexual  Activity   Alcohol use: Yes    Alcohol/week: 6.0 standard drinks of alcohol    Types: 3 Glasses of wine, 2 Cans of beer, 1 Shots of liquor per week    Comment: occas   Drug use: Not Currently    Types: Marijuana    Comment: last used about a month ago   Sexual activity: Yes    Partners: Male    Birth control/protection: Condom, Injection    Comment: Nexplanan  Other Topics Concern   Not on file  Social History Narrative   Lives with fiancee, 1 child they live together, works full time.    Social Drivers of Corporate investment banker Strain: Low Risk  (02/13/2022)   Overall Financial Resource Strain (CARDIA)    Difficulty of Paying Living Expenses: Not very hard  Food Insecurity: No Food Insecurity (09/28/2022)   Hunger Vital Sign    Worried About Running Out of Food in the Last Year: Never true    Ran Out of Food in the Last Year: Never true  Transportation Needs: No Transportation Needs (09/28/2022)   PRAPARE -  Administrator, Civil Service (Medical): No    Lack of Transportation (Non-Medical): No  Physical Activity: Insufficiently Active (02/13/2022)   Exercise Vital Sign    Days of Exercise per Week: 2 days    Minutes of Exercise per Session: 20 min  Stress: No Stress Concern Present (02/13/2022)   [employer] of Occupational Health - Occupational Stress Questionnaire    Feeling of Stress : Not at all  Social Connections: Moderately Isolated (02/13/2022)   Social Connection and Isolation Panel [NHANES]    Frequency of Communication with Friends and Family: More than three times a week    Frequency of Social Gatherings with Friends and Family: Once a week    Attends Religious Services: Never    Database administrator or Organizations: No    Attends Banker Meetings: Never    Marital Status: Living with partner  Intimate Partner Violence: Not At Risk (09/28/2022)   Humiliation, Afraid, Rape, and Kick questionnaire    Fear of Current or Ex-Partner: No    Emotionally Abused: No    Physically Abused: No    Sexually Abused: No    Current Outpatient Medications on File Prior to Visit  Medication Sig Dispense Refill   Albuterol -Budesonide (AIRSUPRA ) 90-80 MCG/ACT AERO Inhale 1-2 puffs into the lungs as needed (wheezing). 10 g 5   buPROPion  (WELLBUTRIN  XL) 150 MG 24 hr tablet Take 1 tablet (150 mg total) by mouth daily. 30 tablet 0   diphenhydrAMINE  (BENADRYL ) 25 mg capsule Take 1 capsule by mouth as needed.     ibuprofen  (ADVIL ) 600 MG tablet Take 1 tablet (600 mg total) by mouth every 6 (six) hours. 30 tablet 0   levocetirizine (XYZAL) 5 MG tablet Take 5 mg by mouth as needed for allergies.     loratadine  (CLARITIN ) 10 MG tablet Take 1 tablet (10 mg total) by mouth daily. (Patient not taking: Reported on 10/03/2023) 90 tablet 3   medroxyPROGESTERone  (DEPO-PROVERA ) 150 MG/ML injection Inject 1 mL (150 mg total) into the muscle every 3 (three) months. 1 mL 3   Mometasone  Furo-Formoterol Fum (DULERA ) 50-5 MCG/ACT AERO Inhale 2 puffs into the lungs 2 (two) times daily. (Patient not taking: Reported on 10/03/2023) 13 g 3   naloxone (NARCAN) nasal spray 4 mg/0.1 mL SMARTSIG:1 Both Nares Daily     sertraline  (ZOLOFT ) 50  MG tablet Take 1 tablet (50 mg total) by mouth daily.     tirzepatide  (ZEPBOUND ) 12.5 MG/0.5ML Pen Inject 12.5 mg into the skin once a week. 2 mL 0   traZODone  (DESYREL ) 50 MG tablet Take 0.5-1 tablets (25-50 mg total) by mouth at bedtime as needed for sleep. 30 tablet 0   No current facility-administered medications on file prior to visit.    Allergies  Allergen Reactions   Cinnamon Itching   Orange (Diagnostic) Hives   Bonjesta  [Doxylamine -Pyridoxine ] Hives and Itching   Erythromycin Rash   Sulfa Antibiotics Rash     Review of Systems Constitutional: negative for chills, fatigue, fevers and sweats Eyes: negative for irritation, redness and visual disturbance Ears, nose, mouth, throat, and face: negative for hearing loss, nasal congestion, snoring and tinnitus Respiratory: negative for asthma, cough, sputum Cardiovascular: negative for chest pain, dyspnea, exertional chest pressure/discomfort, irregular heart beat, palpitations and syncope Gastrointestinal: negative for abdominal pain, change in bowel habits, nausea and vomiting Genitourinary: negative for abnormal menstrual periods, genital lesions, sexual problems and vaginal discharge, dysuria and urinary incontinence Integument/breast: negative for breast lump, breast tenderness and nipple discharge Hematologic/lymphatic: negative for bleeding and easy bruising Musculoskeletal:negative for back pain and muscle weakness Neurological: negative for dizziness, headaches, vertigo and weakness Endocrine: negative for diabetic symptoms including polydipsia, polyuria and skin dryness Allergic/Immunologic: negative for hay fever and urticaria      Objective:  Last menstrual period  09/26/2023, not currently breastfeeding. There is no height or weight on file to calculate BMI.    General Appearance:    Alert, cooperative, no distress, appears stated age  Head:    Normocephalic, without obvious abnormality, atraumatic  Eyes:    PERRL, conjunctiva/corneas clear, EOMs intact bilaterally  Ears:    Normal external ear canals bilaterally  Nose:   Nares normal  Throat:   Lips, mucosa, and tongue normal; teeth and gums normal  Neck:   Supple, symmetrical, trachea midline, no adenopathy; thyroid : no enlargement/tenderness/nodules; no carotid bruit or JVD  Back:     ROM normal, no CVA tenderness  Lungs:     Clear to auscultation bilaterally, respirations unlabored  Chest Wall:    No tenderness or deformity   Heart:    Regular rate and rhythm, S1 and S2 normal, no appreciated murmur, rub or gallop  Breast Exam:    No tenderness, masses, or nipple abnormality; no skin retraction, dimpling or nipple discharge.  Abdomen:     Soft, non-tender, bowel sounds active all four quadrants, no masses, no organomegaly.    Genitalia:    Pelvic:external genitalia normal, vagina without lesions, discharge, or tenderness, rectovaginal septum  normal. Cervix normal in appearance, no cervical motion tenderness, no adnexal masses or tenderness.  Uterus normal size, shape, mobile, regular contours, nontender.  Rectal:    Normal external sphincter.  No hemorrhoids appreciated. Internal exam not done.   Extremities:   Extremities normal, atraumatic, no cyanosis or edema  Pulses:   2+ and symmetric all extremities  Skin:   Skin color, texture, turgor normal, no rashes or lesions  Lymph nodes:   Cervical, supraclavicular, and axillary nodes normal  Neurologic:   CNII-XII intact, normal strength, sensation and reflexes throughout   .  Labs:  Lab Results  Component Value Date   WBC 8.3 03/26/2023   HGB 12.6 03/26/2023   HCT 39.6 03/26/2023   MCV 77.3 (L) 03/26/2023   PLT 309 03/26/2023    Lab  Results  Component Value Date  CREATININE 0.78 03/26/2023   BUN 12 03/26/2023   NA 140 03/26/2023   K 4.1 03/26/2023   CL 107 03/26/2023   CO2 24 03/26/2023    Lab Results  Component Value Date   ALT 28 03/26/2023   AST 16 03/26/2023   ALKPHOS 85 11/01/2022   BILITOT 0.4 03/26/2023    Lab Results  Component Value Date   TSH 2.970 06/23/2020     Assessment:   No diagnosis found.   Plan:   Nakeitha Milligan is a 28 y.o. G31P1021 female here today for her annual exam, doing well.  -Screenings:  Pap: done with cotesting today  *** w/rflx today Mammogram: ordered***due *** Labs: ***A1C, CMP, HepC, Lipid panel, Vit D, TSH PHQ-2 = 0,    = ___, discussed coping techniques; RTC if worsens or develops concern -Contraception: *** -Vaccines: UTD, Tdap today; pt declines Influenza. Gardasil: series not started, declined today.  -Healthy lifestyle modifications discussed: multivitamin, diet, exercise, sunscreen. Emphasized importance of regular physical activity.  -Folic Acid recommendation reviewed.  -All questions answered to patient's satisfaction.  -RTC 1 yr for annual, sooner prn.    Rian Chafe, CMA [facility]

## 2023-10-10 ENCOUNTER — Ambulatory Visit (INDEPENDENT_AMBULATORY_CARE_PROVIDER_SITE_OTHER): Admitting: Obstetrics

## 2023-10-10 ENCOUNTER — Other Ambulatory Visit (HOSPITAL_COMMUNITY)
Admission: RE | Admit: 2023-10-10 | Discharge: 2023-10-10 | Disposition: A | Discharge status: Home / Self Care | Source: Ambulatory Visit | Attending: Obstetrics | Admitting: Obstetrics

## 2023-10-10 ENCOUNTER — Encounter: Payer: Self-pay | Admitting: Obstetrics

## 2023-10-10 VITALS — BP 132/80 | HR 84 | Ht 63.0 in | Wt 313.0 lb

## 2023-10-10 DIAGNOSIS — Z01419 Encounter for gynecological examination (general) (routine) without abnormal findings: Secondary | ICD-10-CM

## 2023-10-10 DIAGNOSIS — Z124 Encounter for screening for malignant neoplasm of cervix: Secondary | ICD-10-CM | POA: Diagnosis present

## 2023-10-10 DIAGNOSIS — G8929 Other chronic pain: Secondary | ICD-10-CM | POA: Insufficient documentation

## 2023-10-16 LAB — CYTOLOGY - PAP
Adequacy: ABSENT
Chlamydia: NEGATIVE
Comment: NEGATIVE
Comment: NORMAL
Diagnosis: NEGATIVE
Diagnosis: REACTIVE
Neisseria Gonorrhea: NEGATIVE

## 2023-10-17 ENCOUNTER — Other Ambulatory Visit: Payer: Self-pay | Admitting: Obstetrics

## 2023-10-17 ENCOUNTER — Ambulatory Visit: Payer: Self-pay | Admitting: Obstetrics

## 2023-10-17 ENCOUNTER — Encounter: Payer: Self-pay | Admitting: Obstetrics

## 2023-10-17 DIAGNOSIS — A599 Trichomoniasis, unspecified: Secondary | ICD-10-CM

## 2023-10-17 MED ORDER — METRONIDAZOLE 500 MG PO TABS: ORAL_TABLET | ORAL | 0 refills | Status: DC

## 2023-10-17 MED ORDER — METRONIDAZOLE 500 MG PO TABS: 500.0000 mg | ORAL_TABLET | Freq: Two times a day (BID) | ORAL | 0 refills | Status: DC

## 2023-11-04 ENCOUNTER — Encounter: Payer: Self-pay | Admitting: Nurse Practitioner

## 2023-11-04 ENCOUNTER — Other Ambulatory Visit: Payer: Self-pay

## 2023-11-04 DIAGNOSIS — F331 Major depressive disorder, recurrent, moderate: Secondary | ICD-10-CM

## 2023-11-04 NOTE — Telephone Encounter (Signed)
 Does not say how much previously prescribed

## 2023-11-05 MED ORDER — SERTRALINE HCL 50 MG PO TABS: 50.0000 mg | ORAL_TABLET | Freq: Every day | ORAL | 1 refills | Status: DC

## 2023-11-06 ENCOUNTER — Encounter: Payer: Self-pay | Admitting: Nurse Practitioner

## 2023-11-06 ENCOUNTER — Ambulatory Visit: Admitting: Nurse Practitioner

## 2023-11-06 VITALS — BP 128/74 | HR 113 | Temp 98.2°F | Resp 18 | Ht 63.0 in | Wt 302.9 lb

## 2023-11-06 DIAGNOSIS — F5105 Insomnia due to other mental disorder: Secondary | ICD-10-CM

## 2023-11-06 DIAGNOSIS — F99 Mental disorder, not otherwise specified: Secondary | ICD-10-CM

## 2023-11-06 DIAGNOSIS — F419 Anxiety disorder, unspecified: Secondary | ICD-10-CM

## 2023-11-06 DIAGNOSIS — F331 Major depressive disorder, recurrent, moderate: Secondary | ICD-10-CM

## 2023-11-06 DIAGNOSIS — E66813 Obesity, class 3: Secondary | ICD-10-CM

## 2023-11-06 DIAGNOSIS — I10 Essential (primary) hypertension: Secondary | ICD-10-CM

## 2023-11-06 DIAGNOSIS — J4520 Mild intermittent asthma, uncomplicated: Secondary | ICD-10-CM

## 2023-11-06 DIAGNOSIS — Z6841 Body Mass Index (BMI) 40.0 and over, adult: Secondary | ICD-10-CM

## 2023-11-06 MED ORDER — ZEPBOUND 12.5 MG/0.5ML ~~LOC~~ SOAJ: 12.5000 mg | SUBCUTANEOUS | 1 refills | Status: DC

## 2023-11-06 NOTE — Progress Notes (Signed)
 BP 128/74   Pulse (!) 113   Temp 98.2 F (36.8 C)   Resp 18   Ht 5' 3 (1.6 m)   Wt (!) 302 lb 14.4 oz (137.4 kg)   SpO2 98%   BMI 53.66 kg/m    Subjective:    Patient ID: Katherine Huynh, female    DOB: Aug 05, 1995, 28 y.o.   MRN: [id number]  HPI: Katherine Huynh is a 28 y.o. female  Chief Complaint  Patient presents with   Medical Management of Chronic Issues    Discussed the use of AI scribe software for clinical note transcription with the patient, who gave verbal consent to proceed.  History of Present Illness Katherine Huynh is a 28 year old female who presents for a four-week follow-up.  She has been on Zepbound  for weight management, starting at a weight of 330 pounds with a BMI of 58.51. She has lost 28 pounds, bringing her current weight to 302 pounds and a BMI of 53.66. The medication is effective as long as she eats enough daily. She is not following a specific diet but is eating less and avoiding bread. She tries to walk for exercise despite the hot weather.  She has a history of depression and anxiety. Her depression screening score has improved from 20 to 11, but her anxiety score remains high at 16. She was previously prescribed Zoloft , which was increased to 50 mg daily, and Wellbutrin , which she is not taking. She feels calmer when taking Zoloft  on schedule, although she ran out a few days ago. She is also using trazodone  for sleep.  She has a history of asthma and is currently using Dulera  and Airsupra  inhalers. She reports being completely out of her medications, including Zepbound , and is on her last injection.     Starting weight: 330 lbs Starting BMI: 58.51 Current weight:302 Current BMI: 53.66 Current weight circumference:47 inches Diet: eating at a caloric deficit  Exercise: walk daily Waist Measurement : 47 inches     11/06/2023    3:04 PM 10/03/2023    1:22 PM 07/03/2023    8:04 AM  Depression screen PHQ 2/9  Decreased Interest 1 1 0  Down, Depressed,  Hopeless 3 3 0  PHQ - 2 Score 4 4 0  Altered sleeping 0 3 0  Tired, decreased energy 1 3 0  Change in appetite 2 3 0  Feeling bad or failure about yourself  3 3 0  Trouble concentrating 1 3 0  Moving slowly or fidgety/restless 0 0 0  Suicidal thoughts 0 1 0  PHQ-9 Score 11 20 0  Difficult doing work/chores Very difficult Extremely dIfficult Not difficult at all       11/06/2023    3:04 PM 10/03/2023    1:23 PM 07/03/2023    8:04 AM 05/22/2023    1:52 PM  GAD 7 : Generalized Anxiety Score  Nervous, Anxious, on Edge 3 3 0 2  Control/stop worrying 3 2 0 2  Worry too much - different things 3 3 0 3  Trouble relaxing 2 2 0 2  Restless 0 1 0 0  Easily annoyed or irritable 2 0 0 1  Afraid - awful might happen 3 3 0 1  Total GAD 7 Score 16 14 0 11  Anxiety Difficulty Very difficult Very difficult Not difficult at all Somewhat difficult     Relevant past medical, surgical, family and social history reviewed and updated as indicated. Interim medical history since our last  visit reviewed. Allergies and medications reviewed and updated.  Review of Systems  Constitutional: Negative for fever or weight change.  Respiratory: Negative for cough and shortness of breath.   Cardiovascular: Negative for chest pain or palpitations.  Gastrointestinal: Negative for abdominal pain, no bowel changes.  Musculoskeletal: Negative for gait problem or joint swelling.  Skin: Negative for rash.  Neurological: Negative for dizziness or headache.  No other specific complaints in a complete review of systems (except as listed in HPI above).      Objective:     BP 128/74   Pulse (!) 113   Temp 98.2 F (36.8 C)   Resp 18   Ht 5' 3 (1.6 m)   Wt (!) 302 lb 14.4 oz (137.4 kg)   SpO2 98%   BMI 53.66 kg/m    Wt Readings from Last 3 Encounters:  11/06/23 (!) 302 lb 14.4 oz (137.4 kg)  10/10/23 (!) 313 lb (142 kg)  10/04/23 (!) 312 lb 8 oz (141.7 kg)    Physical Exam Physical Exam VITALS: BP-  128/74 MEASUREMENTS: Weight- 302, BMI- 53.66. GENERAL: Alert, cooperative, well developed, no acute distress HEENT: Normocephalic, normal oropharynx, moist mucous membranes CHEST: Clear to auscultation bilaterally, No wheezes, rhonchi, or crackles CARDIOVASCULAR: Normal heart rate and rhythm, S1 and S2 normal without murmurs ABDOMEN: Soft, non-tender, non-distended, without organomegaly, Normal bowel sounds EXTREMITIES: No cyanosis or edema NEUROLOGICAL: Cranial nerves grossly intact, Moves all extremities without gross motor or sensory deficit   Results for orders placed or performed in visit on 10/10/23  Cytology - PAP   Collection Time: 10/10/23  1:18 PM  Result Value Ref Range   Neisseria Gonorrhea Negative    Chlamydia Negative    Adequacy      Satisfactory for evaluation; transformation zone component ABSENT.   Diagnosis      - Negative for Intraepithelial Lesions or Malignancy (NILM)   Diagnosis - Benign reactive/reparative changes    Microorganisms Trichomonas vaginalis present    Comment Normal Reference Ranger Chlamydia - Negative    Comment      Normal Reference Range Neisseria Gonorrhea - Negative          Assessment & Plan:   Problem List Items Addressed This Visit       Cardiovascular and Mediastinum   Primary hypertension     Respiratory   Mild intermittent asthma without complication     Other   Morbid obesity with BMI of 50.0-59.9, adult (HCC)   Relevant Medications   tirzepatide  (ZEPBOUND ) 12.5 MG/0.5ML Pen   Moderate episode of recurrent major depressive disorder (HCC) - Primary   Anxiety   Other Visit Diagnoses       Insomnia due to other mental disorder         Class 3 severe obesity due to excess calories with serious comorbidity and body mass index (BMI) of 50.0 to 59.9 in adult       Relevant Medications   tirzepatide  (ZEPBOUND ) 12.5 MG/0.5ML Pen        Assessment and Plan Assessment & Plan Obesity Obesity with a BMI of 53.66,  reduced from 58.51 after losing 28 pounds since starting Zepbound . She tolerates Zepbound  well and adheres to dietary modifications, including reduced intake and avoiding bread. Engages in walking for exercise despite the heat. - Continue Zepbound  12.5 mg weekly.  Depression Depression with improved screening score from 20 to 11. Currently on Zoloft  50 mg daily. Reports feeling more calm with scheduled medication. Discussed potential increase  in Zoloft  dosage, but decided to maintain current regimen pending gene site testing results. Explained Zoloft  dosage can be increased up to 200 mg if needed, and gene site testing will help tailor medication choices. - Order gene site testing to determine optimal medication regimen. - Continue Zoloft  50 mg daily.  Anxiety Anxiety remains significant with a screening score of 16. She is not taking Wellbutrin  as previously recommended. Discussed potential for combination therapy and gene site testing to tailor treatment. Explained gene site testing process and how it categorizes medications into green, yellow, and red based on compatibility with her DNA. - Order gene site testing to determine optimal medication regimen. - Discuss potential for combination therapy based on gene site testing results.  Asthma Asthma managed with Dulera  and Airsupra . - Continue current asthma management with Dulera  and Airsupra .        Follow up plan: Return for pending gene sight testing.

## 2023-11-20 ENCOUNTER — Encounter: Payer: Self-pay | Admitting: Nurse Practitioner

## 2023-11-20 ENCOUNTER — Ambulatory Visit: Payer: Self-pay | Admitting: Nurse Practitioner

## 2023-11-20 VITALS — BP 118/72 | HR 97 | Temp 98.6°F | Resp 18 | Ht 63.0 in | Wt 304.0 lb

## 2023-11-20 DIAGNOSIS — K219 Gastro-esophageal reflux disease without esophagitis: Secondary | ICD-10-CM | POA: Insufficient documentation

## 2023-11-20 DIAGNOSIS — J309 Allergic rhinitis, unspecified: Secondary | ICD-10-CM

## 2023-11-20 DIAGNOSIS — F419 Anxiety disorder, unspecified: Secondary | ICD-10-CM

## 2023-11-20 DIAGNOSIS — F331 Major depressive disorder, recurrent, moderate: Secondary | ICD-10-CM

## 2023-11-20 DIAGNOSIS — L509 Urticaria, unspecified: Secondary | ICD-10-CM | POA: Diagnosis not present

## 2023-11-20 MED ORDER — DESVENLAFAXINE SUCCINATE ER 25 MG PO TB24: 25.0000 mg | ORAL_TABLET | Freq: Every day | ORAL | 0 refills | Status: DC

## 2023-11-20 NOTE — Progress Notes (Signed)
 BP 118/72   Pulse 97   Temp 98.6 F (37 C)   Resp 18   Ht 5' 3 (1.6 m)   Wt (!) 304 lb (137.9 kg)   SpO2 98%   BMI 53.85 kg/m    Subjective:    Patient ID: Katherine Huynh, female    DOB: Dec 30, 1995, 28 y.o.   MRN: [id number]  HPI: Katherine Huynh is a 28 y.o. female  Chief Complaint  Patient presents with   Medical Management of Chronic Issues   Depression    Discussed the use of AI scribe software for clinical note transcription with the patient, who gave verbal consent to proceed.  History of Present Illness A 28 year old female with anxiety and depression who presents for review of gene site testing results and medication management.  She has a history of anxiety and depression and has previously been on Wellbutrin , Prozac , and Zoloft  without improvement. Gene site testing results indicate that lower doses of Zoloft  have not been effective. She has been off Zoloft  for a couple of weeks after running out of the medication.  She experiences stomach pain associated with certain foods, particularly red meat, causing significant heartburn. The pain is described as a heartburn type symptom rather than sharp pain. She manages these symptoms with Pepto as needed and has previously used omeprazole during pregnancy when symptoms were more severe. No sharp abdominal pain is reported.  She is interested in allergy testing due to symptoms of congestion, hives, and skin irritation. Her stomach issues are exacerbated by certain foods and drinks, leading to heartburn and discomfort.         11/06/2023    3:04 PM 10/03/2023    1:22 PM 07/03/2023    8:04 AM  Depression screen PHQ 2/9  Decreased Interest 1 1 0  Down, Depressed, Hopeless 3 3 0  PHQ - 2 Score 4 4 0  Altered sleeping 0 3 0  Tired, decreased energy 1 3 0  Change in appetite 2 3 0  Feeling bad or failure about yourself  3 3 0  Trouble concentrating 1 3 0  Moving slowly or fidgety/restless 0 0 0  Suicidal thoughts 0 1 0   PHQ-9 Score 11 20 0  Difficult doing work/chores Very difficult Extremely dIfficult Not difficult at all    Relevant past medical, surgical, family and social history reviewed and updated as indicated. Interim medical history since our last visit reviewed. Allergies and medications reviewed and updated.  Review of Systems  Ten systems reviewed and is negative except as mentioned in HPI      Objective:     BP 118/72   Pulse 97   Temp 98.6 F (37 C)   Resp 18   Ht 5' 3 (1.6 m)   Wt (!) 304 lb (137.9 kg)   SpO2 98%   BMI 53.85 kg/m    Wt Readings from Last 3 Encounters:  11/20/23 (!) 304 lb (137.9 kg)  11/06/23 (!) 302 lb 14.4 oz (137.4 kg)  10/10/23 (!) 313 lb (142 kg)    Physical Exam Physical Exam GENERAL: Alert, cooperative, well developed, no acute distress HEENT: Normocephalic, normal oropharynx, moist mucous membranes CHEST: Clear to auscultation bilaterally, No wheezes, rhonchi, or crackles CARDIOVASCULAR: Normal heart rate and rhythm, S1 and S2 normal without murmurs ABDOMEN: Soft, non-tender, non-distended, without organomegaly, Normal bowel sounds EXTREMITIES: No cyanosis or edema NEUROLOGICAL: Cranial nerves grossly intact, Moves all extremities without gross motor or sensory deficit  Results for orders placed or performed in visit on 10/10/23  Cytology - PAP   Collection Time: 10/10/23  1:18 PM  Result Value Ref Range   Neisseria Gonorrhea Negative    Chlamydia Negative    Adequacy      Satisfactory for evaluation; transformation zone component ABSENT.   Diagnosis      - Negative for Intraepithelial Lesions or Malignancy (NILM)   Diagnosis - Benign reactive/reparative changes    Microorganisms Trichomonas vaginalis present    Comment Normal Reference Ranger Chlamydia - Negative    Comment      Normal Reference Range Neisseria Gonorrhea - Negative          Assessment & Plan:   Problem List Items Addressed This Visit       Digestive    Gastroesophageal reflux disease without esophagitis     Other   Moderate episode of recurrent major depressive disorder (HCC) - Primary   Relevant Medications   desvenlafaxine  (PRISTIQ ) 25 MG 24 hr tablet   Anxiety   Relevant Medications   desvenlafaxine  (PRISTIQ ) 25 MG 24 hr tablet   Other Visit Diagnoses       Allergic rhinitis, unspecified seasonality, unspecified trigger       Relevant Orders   Ambulatory referral to Allergy     Hives       Relevant Orders   Ambulatory referral to Allergy        Assessment and Plan Assessment & Plan Depression and Anxiety Disorder Depression and anxiety disorder with ineffective trials of Wellbutrin , Prozac , and Zoloft . Genetic testing indicates lower doses of Zoloft  are ineffective due to her genetic profile. - Initiate Pristiq  at the lowest dose for safety. - Schedule follow-up in four weeks to assess response to Pristiq , with the option for a virtual visit.  Gastroesophageal Reflux Disease (GERD) Heartburn and acid reflux, particularly after consuming red meat, likely exacerbated by current medication slowing gastric emptying. - Discuss dietary modifications to avoid red meat to reduce heartburn symptoms. - Consider omeprazole if symptoms persist.  Chronic Urticaria and Skin Irritation Skin irritation and hives, possibly related to allergies, with symptoms including congestion, hives, and skin irritation. - Refer to an allergist for allergy testing to identify potential allergens.        Follow up plan: Return in about 4 weeks (around 12/18/2023) for follow up.

## 2023-11-25 ENCOUNTER — Encounter: Payer: Self-pay | Admitting: Nurse Practitioner

## 2023-12-07 ENCOUNTER — Encounter: Payer: Self-pay | Admitting: Licensed Practical Nurse

## 2023-12-20 ENCOUNTER — Ambulatory Visit

## 2023-12-20 ENCOUNTER — Ambulatory Visit: Admitting: Nurse Practitioner

## 2023-12-20 ENCOUNTER — Encounter: Payer: Self-pay | Admitting: Nurse Practitioner

## 2023-12-20 VITALS — BP 119/81 | HR 82 | Ht 63.0 in | Wt 288.5 lb

## 2023-12-20 VITALS — BP 128/80 | HR 86 | Resp 16 | Ht 63.0 in | Wt 286.8 lb

## 2023-12-20 DIAGNOSIS — Z6841 Body Mass Index (BMI) 40.0 and over, adult: Secondary | ICD-10-CM

## 2023-12-20 DIAGNOSIS — F419 Anxiety disorder, unspecified: Secondary | ICD-10-CM

## 2023-12-20 DIAGNOSIS — F331 Major depressive disorder, recurrent, moderate: Secondary | ICD-10-CM | POA: Diagnosis not present

## 2023-12-20 DIAGNOSIS — Z3042 Encounter for surveillance of injectable contraceptive: Secondary | ICD-10-CM | POA: Diagnosis not present

## 2023-12-20 DIAGNOSIS — E66813 Obesity, class 3: Secondary | ICD-10-CM

## 2023-12-20 MED ORDER — ZEPBOUND 12.5 MG/0.5ML ~~LOC~~ SOAJ: 12.5000 mg | SUBCUTANEOUS | 1 refills | Status: DC

## 2023-12-20 MED ORDER — MEDROXYPROGESTERONE ACETATE 150 MG/ML IM SUSP
150.0000 mg | Freq: Once | INTRAMUSCULAR | Status: AC
  Administered 2023-12-20: 150 mg via INTRAMUSCULAR

## 2023-12-20 MED ORDER — FLUOXETINE HCL 10 MG PO CAPS: 10.0000 mg | ORAL_CAPSULE | Freq: Every day | ORAL | 0 refills | Status: DC

## 2023-12-20 NOTE — Patient Instructions (Signed)

## 2023-12-20 NOTE — Progress Notes (Signed)
 BP 128/80   Pulse 86   Resp 16   Ht 5' 3 (1.6 m)   Wt 286 lb 12.8 oz (130.1 kg)   LMP 12/08/2023 (Exact Date)   SpO2 99%   Breastfeeding No   BMI 50.80 kg/m    Subjective:    Patient ID: Katherine Huynh, female    DOB: 11-Jul-1995, 28 y.o.   MRN: [id number]  HPI: Katherine Huynh is a 28 y.o. female  Chief Complaint  Patient presents with   Medical Management of Chronic Issues    Pt states has not noticed any difference with starting Pristiq     Discussed the use of AI scribe software for clinical note transcription with the patient, who gave verbal consent to proceed.  History of Present Illness Katherine Huynh is a 28 year old female with depression and anxiety who presents for follow-up.  Depressive and anxiety symptoms - Depression and anxiety have worsened despite current pharmacotherapy - Depression screening score: 10 - GAD score: 15 - No current engagement in counseling and no interest in pursuing counseling at this time  Antidepressant medication history and side effects - Currently taking Pristiq  25 mg daily, initiated after gene site testing, with no improvement in mental health symptoms - Previously trialed Zoloft , prozacand wellbutrin  - Prozac  was effective in the past but discontinued for unspecified reasons - Concern for decreased libido, attributed to current medication - Significant decline in sexual interest, which she identifies as important for her mental well-being  Weight and body composition changes - Weight decreased from 304 pounds (BMI 53.85) to 286 pounds (BMI 50.80) - Waist circumference increased from 47 inches to 49 inches   Previous weight :304 lbs BMI 53.85 Filed Weights   12/20/23 1325  Weight: 286 lb 12.8 oz (130.1 kg)   Body mass index is 50.8 kg/m.  Flowsheet Row Office Visit from 12/20/2023 in [facility] Office Visit from 11/06/2023 in [facility] Office Visit from 06/23/2020 in [facility]  Health [facility] at [facility]  1 49 inches 47 inches 52.5 inches   - Encourage continuation of lifestyle modifications, including dietary management and regular exercise. -continue to increase physical activity, getting at least 150 min of physical activity a week.  Work on including Runner, broadcasting/film/video 2 days a week.  - continue eating at a calorie deficit 1600-1700 cal a day, eating a well balanced diet with whole foods, avoiding processed foods.   Patient is motivated to continue working on lifestyle modification.      12/20/2023    1:23 PM 11/06/2023    3:04 PM 10/03/2023    1:22 PM  Depression screen PHQ 2/9  Decreased Interest 3 1 1   Down, Depressed, Hopeless 3 3 3   PHQ - 2 Score 6 4 4   Altered sleeping 0 0 3  Tired, decreased energy 0 1 3  Change in appetite 2 2 3   Feeling bad or failure about yourself  2 3 3   Trouble concentrating 0 1 3  Moving slowly or fidgety/restless 0 0 0  Suicidal thoughts 0 0 1  PHQ-9 Score 10 11 20   Difficult doing work/chores Very difficult Very difficult Extremely dIfficult       12/20/2023    1:24 PM 11/06/2023    3:04 PM 10/03/2023    1:23 PM 07/03/2023    8:04 AM  GAD 7 : Generalized Anxiety Score  Nervous, Anxious, on Edge 3 3 3  0  Control/stop worrying 3 3 2  0  Worry too much - different things 3 3 3  0  Trouble relaxing 3 2 2  0  Restless 0 0 1 0  Easily annoyed or irritable 0 2 0 0  Afraid - awful might happen 3 3 3  0  Total GAD 7 Score 15 16 14  0  Anxiety Difficulty Very difficult Very difficult Very difficult Not difficult at all     Relevant past medical, surgical, family and social history reviewed and updated as indicated. Interim medical history since our last visit reviewed. Allergies and medications reviewed and updated.  Review of Systems  Constitutional: Negative for fever or weight change.  Respiratory: Negative for cough and shortness of breath.   Cardiovascular: Negative for chest pain or palpitations.   Gastrointestinal: Negative for abdominal pain, no bowel changes.  Musculoskeletal: Negative for gait problem or joint swelling.  Skin: Negative for rash.  Neurological: Negative for dizziness or headache.  No other specific complaints in a complete review of systems (except as listed in HPI above).      Objective:     BP 128/80   Pulse 86   Resp 16   Ht 5' 3 (1.6 m)   Wt 286 lb 12.8 oz (130.1 kg)   LMP 12/08/2023 (Exact Date)   SpO2 99%   Breastfeeding No   BMI 50.80 kg/m    Wt Readings from Last 3 Encounters:  12/20/23 286 lb 12.8 oz (130.1 kg)  11/20/23 (!) 304 lb (137.9 kg)  11/06/23 (!) 302 lb 14.4 oz (137.4 kg)    Physical Exam Physical Exam MEASUREMENTS: Weight- 286, BMI- 50.8. GENERAL: Alert, cooperative, well developed, no acute distress HEENT: Normocephalic, normal oropharynx, moist mucous membranes CHEST: Clear to auscultation bilaterally, No wheezes, rhonchi, or crackles CARDIOVASCULAR: Normal heart rate and rhythm, S1 and S2 normal without murmurs ABDOMEN: Soft, non-tender, non-distended, without organomegaly, Normal bowel sounds EXTREMITIES: No cyanosis or edema NEUROLOGICAL: Cranial nerves grossly intact, Moves all extremities without gross motor or sensory deficit   Results for orders placed or performed in visit on 10/10/23  Cytology - PAP   Collection Time: 10/10/23  1:18 PM  Result Value Ref Range   Neisseria Gonorrhea Negative    Chlamydia Negative    Adequacy      Satisfactory for evaluation; transformation zone component ABSENT.   Diagnosis      - Negative for Intraepithelial Lesions or Malignancy (NILM)   Diagnosis - Benign reactive/reparative changes    Microorganisms Trichomonas vaginalis present    Comment Normal Reference Ranger Chlamydia - Negative    Comment      Normal Reference Range Neisseria Gonorrhea - Negative          Assessment & Plan:   Problem List Items Addressed This Visit       Other   Moderate episode of  recurrent major depressive disorder (HCC) - Primary   Relevant Medications   FLUoxetine  (PROZAC ) 10 MG capsule   Anxiety   Relevant Medications   FLUoxetine  (PROZAC ) 10 MG capsule   Other Visit Diagnoses       Class 3 severe obesity due to excess calories with serious comorbidity and body mass index (BMI) of 50.0 to 59.9 in adult       Relevant Medications   tirzepatide  (ZEPBOUND ) 12.5 MG/0.5ML Pen        Assessment and Plan Assessment & Plan Major depressive disorder and generalized anxiety disorder Currently on Pristiq  25 mg daily with no improvement. Depression screening score is 10, and GAD  score is 15, indicating moderate anxiety. Previous trials of Zoloft , Wellbutrin , and Prozac , with Prozac  being effective. Significant decrease in libido, a side effect of Pristiq . - Discontinue Pristiq . - Initiate Prozac . - Offer counseling services if desired.  Obesity Current weight is 286 pounds with a BMI of 50.80, indicating some weight loss from previous weight of 304 pounds and BMI of 53.85. Waist circumference increased from 47 inches to 49 inches. - Encourage continuation of lifestyle modifications, including dietary management and regular exercise. -continue to increase physical activity, getting at least 150 min of physical activity a week.  Work on including Runner, broadcasting/film/video 2 days a week.  - continue eating at a calorie deficit 1600-1700 cal a day, eating a well balanced diet with whole foods, avoiding processed foods.   Patient is motivated to continue working on lifestyle modification.          Follow up plan: Return in about 4 weeks (around 01/17/2024) for follow up.

## 2023-12-20 NOTE — Progress Notes (Signed)
    NURSE VISIT NOTE  Subjective:    Patient ID: Katherine Huynh, female    DOB: Apr 20, 1996, 28 y.o.   MRN: [id number]  HPI  Patient is a 28 y.o. G81P1021 female who presents for depo provera  injection.   Objective:    BP 119/81   Pulse 82   Ht 5' 3 (1.6 m)   Wt 288 lb 8 oz (130.9 kg)   BMI 51.11 kg/m   Last Annual: 10/10/23. Last pap: 10/10/2023. Last Depo-Provera : 10/04/2023. Side Effects if any: none. Serum HCG indicated? No . Depo-Provera  150 mg IM given by: Rollo Maxin, CMA. Site: Right Deltoid  Lab Review  No results found for any visits on 12/20/23.  Assessment:   1. Surveillance for Depo-Provera  contraception      Plan:   Next appointment due between 03/06/24 and 04/03/24.    Rollo JINNY Maxin, CMA

## 2023-12-26 ENCOUNTER — Other Ambulatory Visit: Payer: Self-pay | Admitting: Nurse Practitioner

## 2023-12-26 ENCOUNTER — Encounter: Payer: Self-pay | Admitting: Nurse Practitioner

## 2023-12-26 DIAGNOSIS — F331 Major depressive disorder, recurrent, moderate: Secondary | ICD-10-CM

## 2023-12-26 DIAGNOSIS — F419 Anxiety disorder, unspecified: Secondary | ICD-10-CM

## 2023-12-26 MED ORDER — FLUOXETINE HCL 10 MG PO CAPS
10.0000 mg | ORAL_CAPSULE | Freq: Every day | ORAL | 1 refills | Status: DC
Start: 2023-12-26 — End: 2024-01-20

## 2023-12-31 ENCOUNTER — Other Ambulatory Visit: Payer: Self-pay | Admitting: Certified Nurse Midwife

## 2024-01-20 ENCOUNTER — Ambulatory Visit: Admitting: Nurse Practitioner

## 2024-01-20 ENCOUNTER — Encounter: Payer: Self-pay | Admitting: Nurse Practitioner

## 2024-01-20 VITALS — BP 120/76 | HR 98 | Temp 98.9°F | Resp 18 | Ht 63.0 in | Wt 277.9 lb

## 2024-01-20 DIAGNOSIS — E66813 Obesity, class 3: Secondary | ICD-10-CM | POA: Diagnosis not present

## 2024-01-20 DIAGNOSIS — F419 Anxiety disorder, unspecified: Secondary | ICD-10-CM

## 2024-01-20 DIAGNOSIS — Z6841 Body Mass Index (BMI) 40.0 and over, adult: Secondary | ICD-10-CM

## 2024-01-20 DIAGNOSIS — F331 Major depressive disorder, recurrent, moderate: Secondary | ICD-10-CM | POA: Diagnosis not present

## 2024-01-20 MED ORDER — ZEPBOUND 15 MG/0.5ML ~~LOC~~ SOAJ: 15.0000 mg | SUBCUTANEOUS | 1 refills | Status: DC

## 2024-01-20 MED ORDER — FLUOXETINE HCL 20 MG PO CAPS: 20.0000 mg | ORAL_CAPSULE | Freq: Every day | ORAL | 1 refills | Status: AC

## 2024-01-20 NOTE — Progress Notes (Signed)
 BP 120/76   Pulse 98   Temp 98.9 F (37.2 C)   Resp 18   Ht 5' 3 (1.6 m)   Wt 277 lb 14.4 oz (126.1 kg)   SpO2 98%   BMI 49.23 kg/m    Subjective:    Patient ID: Katherine Huynh, female    DOB: 1996/03/18, 28 y.o.   MRN: [id number]  HPI: Katherine Huynh is a 28 y.o. female  Chief Complaint  Patient presents with   Medical Management of Chronic Issues   Depression   Obesity   Discussed the use of AI scribe software for clinical note transcription with the patient, who gave verbal consent to proceed.  History of Present Illness Katherine Huynh is a 28 year old female who presents for a four-week follow-up for depression and anxiety.  Depressive and anxiety symptoms - Four-week follow-up for depression and anxiety - Current medication: fluoxetine  10 mg daily - No noticeable side effects from fluoxetine  - Previous depression screening score: 10; current depression score: 10 (no change) - Previous anxiety score: 15; current anxiety score: 9 (improved) - Feels better and satisfied with current medication regimen  Hypertension - History of hypertension - Not currently taking antihypertensive medication  Asthma symptoms - History of asthma - Uses Airsupra  as needed - No current asthma symptoms or issues  Gastroesophageal reflux symptoms - GERD controlled through dietary modifications - No current symptoms  Hyperlipidemia - Managing hyperlipidemia through dietary changes - Last lipid panel (November 2024): LDL 104 mg/dL  Obesity and weight management - History of obesity - Currently on Zepbound  12.5 mg weekly - Weight decreased from 286 lbs to 277 lbs - Maintains a calorie deficit and engages in physical activity, including strength training at least two days per week - Waist circumference: 49 inches    Flowsheet Row Office Visit from 12/20/2023 in [facility] Office Visit from 11/06/2023 in [facility] Office Visit from  06/23/2020 in [facility] at [facility]  1 49 inches 47 inches 52.5 inches    Body mass index is 49.23 kg/m.  Filed Weights   01/20/24 1431  Weight: 277 lb 14.4 oz (126.1 kg)       01/20/2024    2:34 PM 12/20/2023    1:23 PM 11/06/2023    3:04 PM  Depression screen PHQ 2/9  Decreased Interest 1 3 1   Down, Depressed, Hopeless 2 3 3   PHQ - 2 Score 3 6 4   Altered sleeping 0 0 0  Tired, decreased energy 1 0 1  Change in appetite 3 2 2   Feeling bad or failure about yourself  3 2 3   Trouble concentrating 0 0 1  Moving slowly or fidgety/restless 0 0 0  Suicidal thoughts 0 0 0  PHQ-9 Score 10 10 11   Difficult doing work/chores Somewhat difficult Very difficult Very difficult       01/20/2024    2:32 PM 12/20/2023    1:24 PM 11/06/2023    3:04 PM 10/03/2023    1:23 PM  GAD 7 : Generalized Anxiety Score  Nervous, Anxious, on Edge 3 3 3 3   Control/stop worrying 3 3 3 2   Worry too much - different things 1 3 3 3   Trouble relaxing 1 3 2 2   Restless 0 0 0 1  Easily annoyed or irritable 1 0 2 0  Afraid - awful might happen 0 3 3 3   Total GAD 7 Score 9 15 16  14  Anxiety Difficulty Somewhat difficult Very difficult Very difficult Very difficult     Relevant past medical, surgical, family and social history reviewed and updated as indicated. Interim medical history since our last visit reviewed. Allergies and medications reviewed and updated.  Review of Systems  Constitutional: Negative for fever or weight change.  Respiratory: Negative for cough and shortness of breath.   Cardiovascular: Negative for chest pain or palpitations.  Gastrointestinal: Negative for abdominal pain, no bowel changes.  Musculoskeletal: Negative for gait problem or joint swelling.  Skin: Negative for rash.  Neurological: Negative for dizziness or headache.  No other specific complaints in a complete review of systems (except as listed in HPI above).      Objective:      BP 120/76    Pulse 98   Temp 98.9 F (37.2 C)   Resp 18   Ht 5' 3 (1.6 m)   Wt 277 lb 14.4 oz (126.1 kg)   SpO2 98%   BMI 49.23 kg/m    Wt Readings from Last 3 Encounters:  01/20/24 277 lb 14.4 oz (126.1 kg)  12/20/23 288 lb 8 oz (130.9 kg)  12/20/23 286 lb 12.8 oz (130.1 kg)    Physical Exam VITALS: BP- 120/76 MEASUREMENTS: Weight- 277. GENERAL: Alert, cooperative, well developed, no acute distress. HEENT: Normocephalic, normal oropharynx, moist mucous membranes. CHEST: Clear to auscultation bilaterally, no wheezes, rhonchi, or crackles. CARDIOVASCULAR: Normal heart rate and rhythm, S1 and S2 normal without murmurs. ABDOMEN: Soft, non-tender, non-distended, without organomegaly, normal bowel sounds. EXTREMITIES: No cyanosis or edema. NEUROLOGICAL: Cranial nerves grossly intact, moves all extremities without gross motor or sensory deficit.          Assessment & Plan:   Problem List Items Addressed This Visit       Other   Moderate episode of recurrent major depressive disorder (HCC)   Relevant Medications   FLUoxetine  (PROZAC ) 20 MG capsule   Anxiety   Relevant Medications   FLUoxetine  (PROZAC ) 20 MG capsule   Other Visit Diagnoses       Class 3 severe obesity due to excess calories with serious comorbidity and body mass index (BMI) of 50.0 to 59.9 in adult    -  Primary   Relevant Medications   tirzepatide  (ZEPBOUND ) 15 MG/0.5ML Pen        Assessment and Plan Assessment & Plan Depression and Anxiety Disorder Depression screening score remains at 10, indicating persistent symptoms. Anxiety screening score has improved from 15 to 9, showing some improvement. Currently on fluoxetine  10 mg daily with no reported side effects. She feels better but believes she could feel even better, indicating a potential benefit from dose adjustment. - Increase fluoxetine  to 20 mg daily. Use current supply by doubling the dose until new prescription is filled. - Send new prescription for  fluoxetine  20 mg to pharmacy. - Follow up in 3 months and report on new dose efficacy.  Obesity, class 3 Weight has decreased from 286 pounds to 277 pounds, indicating progress with weight management. BMI previously recorded at 50.8. She is on Zepbound  12.5 mg weekly and is adhering to a calorie deficit diet and increased physical activity, including strength training twice a week. She agreed to increase the Zepbound  dose as per titration schedule to maximize therapeutic benefit, given uncertainty about future access to the medication. - Increase Zepbound  dose as per titration schedule to maximize therapeutic benefit. - Continue current diet and exercise regimen. - Follow up in 3 months. -  Encourage continuation of lifestyle modifications, including dietary management and regular exercise. -continue to increase physical activity, getting at least 150 min of physical activity a week.  Work on including Runner, broadcasting/film/video 2 days a week.  - continue eating at a calorie deficit 1600-1700 cal a day, eating a well balanced diet with whole foods, avoiding processed foods.   Patient is motivated to continue working on lifestyle modification.    Hypertension Blood pressure is well-controlled at 120/76 mmHg. No current antihypertensive medication is being taken.  Asthma Asthma is well-controlled with no reported issues. She uses Airsupra  as needed.  Gastroesophageal reflux disease (GERD) GERD is currently diet-controlled with no new symptoms reported.  Hyperlipidemia She is working on diet to manage hyperlipidemia. Last LDL was 104 mg/dL in November 7975. - Continue dietary modifications to manage lipid levels. - Follow up in 3 months with lab work.        Follow up plan: Return in about 3 months (around 04/20/2024) for follow up.

## 2024-02-11 ENCOUNTER — Encounter: Payer: Self-pay | Admitting: Obstetrics

## 2024-02-13 ENCOUNTER — Ambulatory Visit

## 2024-02-13 VITALS — BP 124/85 | HR 83 | Temp 99.4°F | Wt 278.0 lb

## 2024-02-13 DIAGNOSIS — R300 Dysuria: Secondary | ICD-10-CM | POA: Diagnosis not present

## 2024-02-13 DIAGNOSIS — N390 Urinary tract infection, site not specified: Secondary | ICD-10-CM

## 2024-02-13 LAB — POCT URINALYSIS DIPSTICK
Glucose, UA: NEGATIVE
Ketones, UA: 1
Nitrite, UA: NEGATIVE
Protein, UA: POSITIVE — AB
Spec Grav, UA: 1.015 (ref 1.010–1.025)
Urobilinogen, UA: 0.2 U/dL
pH, UA: 5 (ref 5.0–8.0)

## 2024-02-13 MED ORDER — NITROFURANTOIN MONOHYD MACRO 100 MG PO CAPS: 100.0000 mg | ORAL_CAPSULE | Freq: Two times a day (BID) | ORAL | 0 refills | Status: DC

## 2024-02-13 NOTE — Patient Instructions (Signed)
 Dysuria Dysuria is pain or discomfort when you pee. The pain may be felt in your urethra, which is the part of your body that drains pee (urine) from your bladder. The pain may also be felt near your genitals, groin, or in your lower belly or back. You may have to pee often or have the sudden feeling that you need to pee. This condition can affect anyone, but it's more common in females. It can be caused by: A urinary tract infection (UTI). Kidney stones or bladder stones. Some sexually transmitted infections (STIs). Dehydration. This is when there's not enough water in your body. Irritation and swelling in the vagina. The use of some medicines. The use of some soaps or products with a scent. Follow these instructions at home: Medicines  Take your medicines only as told. Take your antibiotics as told. Do not stop taking them even if you start to feel better. Eating and drinking Drink enough fluid to keep your pee pale yellow. Certain drinks can make the pain worse. Avoid: Drinks with caffeine in them. Tea. Alcohol. In males, alcohol may irritate the prostate. General instructions Watch your condition for any changes, such as color changes in your pee. Pee often. Do not hold your pee for a long time. If you're female, wipe from front to back after you pee or poop. Use each tissue only once when you wipe. Pee after you have sex. If you've had any tests done, it's up to you to get your test results. Ask your health care provider, or the department doing the test, when your results will be ready. Contact a health care provider if: You have a fever or chills. You have pain in your back or sides. You throw up or feel like you may throw up. You have blood in your pee. You're not peeing as often as normal. You feel very weak. Get help right away if: You have very bad pain that doesn't get better with medicine. You're confused. You have a fast heartbeat while resting. This information is  not intended to replace advice given to you by your health care provider. Make sure you discuss any questions you have with your health care provider. Document Revised: 09/04/2022 Document Reviewed: 09/04/2022 Elsevier Patient Education  2024 [employer].

## 2024-02-13 NOTE — Progress Notes (Signed)
    NURSE VISIT NOTE  Subjective:    Patient ID: Katherine Huynh, female    DOB: 1995-06-25, 28 y.o.   MRN: [id number]       HPI  Patient is a 28 y.o. G31P1021 female who presents for dysuria and hematuria for 1 week.  She says she had some flank pain earlier in the week and has felt feverish.     Objective:    BP 124/85   Pulse 83   Temp 99.4 F (37.4 C)   Wt 278 lb (126.1 kg)   BMI 49.25 kg/m    Lab Review  Results for orders placed or performed in visit on 02/13/24  POCT Urinalysis Dipstick  Result Value Ref Range   Color, UA     Clarity, UA     Glucose, UA Negative Negative   Bilirubin, UA +    Ketones, UA 1    Spec Grav, UA 1.015 1.010 - 1.025   Blood, UA +++    pH, UA 5.0 5.0 - 8.0   Protein, UA Positive (A) Negative   Urobilinogen, UA 0.2 0.2 or 1.0 E.U./dL   Nitrite, UA negative    Leukocytes, UA Large (3+) (A) Negative   Appearance     Odor      Assessment:   1. Dysuria   2. Urinary tract infection with hematuria, site unspecified      Plan:   Urine Culture Sent. Treatment  Macrobid  100 mg PO BID for 7 days. Maintain adequate hydration.  May use AZO OTC prn.  Follow up if symptoms worsen or fail to improve as anticipated, and as needed.    Rollo FORBES Louder, RN

## 2024-02-15 LAB — URINE CULTURE

## 2024-02-16 ENCOUNTER — Ambulatory Visit: Payer: Self-pay | Admitting: Certified Nurse Midwife

## 2024-02-20 ENCOUNTER — Encounter: Payer: Self-pay | Admitting: Obstetrics

## 2024-03-04 ENCOUNTER — Other Ambulatory Visit (HOSPITAL_COMMUNITY): Payer: Self-pay

## 2024-03-04 ENCOUNTER — Telehealth: Payer: Self-pay | Admitting: Pharmacy Technician

## 2024-03-04 NOTE — Telephone Encounter (Signed)
 Pharmacy Patient Advocate Encounter   Received notification from Onbase that prior authorization for Airsupra  90-80MCG/ACT aerosol is due for renewal.   Insurance verification completed.   The patient is insured through [employer].  Action: PA required and submitted KEY/EOC/Request #: [id number] from 03/04/24 to 03/04/25

## 2024-03-06 ENCOUNTER — Ambulatory Visit (INDEPENDENT_AMBULATORY_CARE_PROVIDER_SITE_OTHER)

## 2024-03-06 VITALS — BP 123/83 | HR 83 | Ht 63.0 in | Wt 273.6 lb

## 2024-03-06 DIAGNOSIS — Z3042 Encounter for surveillance of injectable contraceptive: Secondary | ICD-10-CM

## 2024-03-06 NOTE — Patient Instructions (Signed)

## 2024-03-06 NOTE — Progress Notes (Signed)
    NURSE VISIT NOTE  Subjective:    Patient ID: Katherine Huynh, female    DOB: [birth year], 28 y.o.   MRN: [id number]  HPI  Patient is a 28 y.o. G65P1021 female who presents for depo provera  injection.   Objective:    BP 123/83   Pulse 83   Ht 5' 3 (1.6 m)   Wt 273 lb 9.6 oz (124.1 kg)   LMP 12/20/2023 (Exact Date)   Breastfeeding No   BMI 48.47 kg/m   Last Annual: 10/10/23. Last pap: 10/10/23. Last Depo-Provera : 12/20/23. Side Effects if any: none. Patient reports after last injection she bled for a month and flow was described as heavy.  Serum HCG indicated? No . Depo-Provera  150 mg IM given by: Rollo Maxin, CMA. Site: Left Ventrogluteal  Lab Review  No results found for any visits on 03/06/24.  Assessment:   1. Surveillance for Depo-Provera  contraception      Plan:   Next appointment due between 05/22/24 and 06/05/24.    Rollo JINNY Maxin, CMA

## 2024-03-30 ENCOUNTER — Ambulatory Visit: Admitting: Family Medicine

## 2024-03-30 ENCOUNTER — Encounter: Payer: Self-pay | Admitting: Family Medicine

## 2024-03-30 VITALS — BP 118/70 | HR 91 | Resp 16 | Ht 63.0 in | Wt 271.0 lb

## 2024-03-30 DIAGNOSIS — F331 Major depressive disorder, recurrent, moderate: Secondary | ICD-10-CM

## 2024-03-30 DIAGNOSIS — F419 Anxiety disorder, unspecified: Secondary | ICD-10-CM

## 2024-03-30 DIAGNOSIS — J4520 Mild intermittent asthma, uncomplicated: Secondary | ICD-10-CM

## 2024-03-30 DIAGNOSIS — K219 Gastro-esophageal reflux disease without esophagitis: Secondary | ICD-10-CM

## 2024-03-30 MED ORDER — LEVOCETIRIZINE DIHYDROCHLORIDE 5 MG PO TABS: 5.0000 mg | ORAL_TABLET | ORAL | 1 refills | Status: AC | PRN

## 2024-03-30 MED ORDER — BUPROPION HCL ER (XL) 150 MG PO TB24: 150.0000 mg | ORAL_TABLET | Freq: Every day | ORAL | 0 refills | Status: DC

## 2024-03-30 NOTE — Progress Notes (Signed)
 Name: Katherine Huynh   MRN: [id number]    DOB: 1995/09/22   Date:03/30/2024       Progress Note  Subjective  Chief Complaint  Chief Complaint  Patient presents with   Medication Problem    increasing my prozac  and talk about a zepbound  replacement treatment as my insurance no longer covers it   Discussed the use of AI scribe software for clinical note transcription with the patient, who gave verbal consent to proceed.  History of Present Illness Katherine Huynh is a 28 year old female with depression who presents for medication adjustment.  She experiences ongoing depressive episodes despite current treatment with fluoxetine  20 mg daily. She desires a dose increase, feeling she could improve further. Her PHQ-9 score is currently 12, down from 20 in May, but she notes worsening symptoms again. She has a history of depression diagnosed by a psychiatrist, with no suicide attempts or seasonal affective disorder. She is currently taking fluoxetine  20 mg daily without side effects. Previously, she took Wellbutrin  alone at age 53. Genetic testing indicated fluoxetine  as a suitable medication, reviewed with patient and can also take Wellbutrin    She has a history of asthma, using AirSupra  as needed. She reports not using Dulera  for maintenance and has experienced recent shortness of breath, relieved by AirSupra .  She has allergies to pollen, dirt, and dust, taking Benadryl  as needed, though it causes grogginess. She also uses Xyzal when available, which she finds helpful.  She has a history of insomnia but does not currently take medication for sleep, relying on Benadryl  and Xyzal to aid her sleep.  She uses Depo-Provera  for pregnancy prevention and takes ibuprofen  for irregular and prolonged menstrual periods, which she finds effective in managing her symptoms.  She previously used Zepbound  for weight loss, losing 60 pounds since January, but her insurance no longer covers it. She has one supply left  and plans to taper off gradually. Her BMI is currently 48, and she is conscious of maintaining her weight through diet and exercise.    Patient Active Problem List   Diagnosis Date Noted   Gastroesophageal reflux disease without esophagitis 11/20/2023   Chronic bilateral low back pain without sciatica 10/10/2023   Anxiety 05/22/2023   Mild intermittent asthma without complication 03/26/2023   Status post C-section 10/04/2022   Primary hypertension 06/23/2020   Pure hypercholesterolemia 06/23/2020   Severe dysmenorrhea 05/30/2018   Moderate episode of recurrent major depressive disorder (HCC) 05/30/2018   Murmur, cardiac 10/12/2016   Polycystic disease, ovaries 10/02/2016   Morbid obesity (HCC) 02/17/2016   Difficulty controlling anger 02/17/2016   Athlete's foot, left 10/10/2015   Onychomycosis 09/15/2015   Fam hx-ischem heart disease 09/15/2015    Past Surgical History:  Procedure Laterality Date   CESAREAN SECTION  09/29/2022   Procedure: CESAREAN SECTION;  Surgeon: Connell Davies, MD;  Location: [facility] ORS;  Service: Obstetrics;;   DG HYSTEROGRAM (HSD)  01/13/2020       HYSTEROSCOPY WITH D & C N/A 11/07/2020   Procedure: DILATATION AND CURETTAGE LELDON;  Surgeon: Connell Davies, MD;  Location: [facility] OR[facility];  Service: Gynecology;  Laterality: N/A;   tooth implant Right 02/11/2022    Family History  Problem Relation Age of Onset   Obesity Mother    Polycystic ovary syndrome Mother    Hypertension Father    Hyperlipidemia Father    Sleep apnea Father    Obesity Father    Alcohol abuse Sister    ADD / ADHD Sister  Cancer Maternal Grandmother        breast   Fibroids Maternal Grandmother    Uterine cancer Maternal Grandmother 75   Heart disease Maternal Grandfather    Heart disease Paternal Grandfather     Social History   Tobacco Use   Smoking status: Former    Current packs/day: 0.00    Types: Cigarettes    Quit date: 09/28/2016    Years since quitting: 7.5    Smokeless tobacco: Never  Substance Use Topics   Alcohol use: Yes    Alcohol/week: 6.0 standard drinks of alcohol    Types: 3 Glasses of wine, 2 Cans of beer, 1 Shots of liquor per week    Comment: occas     Current Outpatient Medications:    Albuterol -Budesonide (AIRSUPRA ) 90-80 MCG/ACT AERO, Inhale 1-2 puffs into the lungs as needed (wheezing)., Disp: 10 g, Rfl: 5   diphenhydrAMINE  (BENADRYL ) 25 mg capsule, Take 1 capsule by mouth as needed., Disp: , Rfl:    FLUoxetine  (PROZAC ) 20 MG capsule, Take 1 capsule (20 mg total) by mouth daily., Disp: 90 capsule, Rfl: 1   ibuprofen  (ADVIL ) 600 MG tablet, Take 1 tablet (600 mg total) by mouth every 6 (six) hours., Disp: 30 tablet, Rfl: 0   levocetirizine (XYZAL) 5 MG tablet, Take 5 mg by mouth as needed for allergies., Disp: , Rfl:    medroxyPROGESTERone  Acetate 150 MG/ML SUSY, INJECT 1 ML (150 MG TOTAL) INTO THE MUSCLE EVERY 3 MONTHS, Disp: 1 mL, Rfl: 0   Mometasone Furo-Formoterol Fum (DULERA ) 50-5 MCG/ACT AERO, Inhale 2 puffs into the lungs 2 (two) times daily., Disp: 13 g, Rfl: 3   naloxone (NARCAN) nasal spray 4 mg/0.1 mL, SMARTSIG:1 Both Nares Daily, Disp: , Rfl:   Allergies  Allergen Reactions   Cinnamon Itching   Orange (Diagnostic) Hives   Bonjesta  [Doxylamine -Pyridoxine ] Hives and Itching   Erythromycin Rash   Sulfa Antibiotics Rash    I personally reviewed active problem list, medication list, allergies, family history with the patient/caregiver today.   ROS  Ten systems reviewed and is negative except as mentioned in HPI    Objective Physical Exam MEASUREMENTS: Weight- 271, BMI- 48.0. CONSTITUTIONAL: Patient appears well-developed and well-nourished. No distress. HEENT: Head atraumatic, normocephalic, neck supple. CARDIOVASCULAR: Normal rate, regular rhythm and normal heart sounds. No murmur heard. No edema in extremities. PULMONARY: Effort normal and breath sounds normal. Lungs clear to auscultation bilaterally. No  respiratory distress. ABDOMINAL: There is no tenderness or distention. MUSCULOSKELETAL: Normal gait. Without gross motor or sensory deficit. PSYCHIATRIC: Patient has a normal mood and affect. Behavior is normal. Judgment and thought content normal.  Vitals:   03/30/24 1259  BP: 118/70  Pulse: 91  Resp: 16  SpO2: 97%  Weight: 271 lb (122.9 kg)  Height: 5' 3 (1.6 m)    Body mass index is 48.01 kg/m.  Recent Results (from the past 2160 hours)  POCT Urinalysis Dipstick     Status: Abnormal   Collection Time: 02/13/24  3:27 PM  Result Value Ref Range   Color, UA     Clarity, UA     Glucose, UA Negative Negative   Bilirubin, UA +    Ketones, UA 1    Spec Grav, UA 1.015 1.010 - 1.025   Blood, UA +++    pH, UA 5.0 5.0 - 8.0   Protein, UA Positive (A) Negative   Urobilinogen, UA 0.2 0.2 or 1.0 E.U./dL   Nitrite, UA negative    Leukocytes,  UA Large (3+) (A) Negative   Appearance     Odor    Urine Culture     Status: Abnormal   Collection Time: 02/13/24  4:16 PM   Specimen: Urine   UR  Result Value Ref Range   Urine Culture, Routine Final report (A)    Organism ID, Bacteria Comment (A)     Comment: Beta hemolytic Streptococcus, group B 50,000-100,000 colony forming units per mL Penicillin  and ampicillin  are drugs of choice for treatment of beta-hemolytic streptococcal infections. Susceptibility testing of penicillins and other beta-lactam agents approved by the FDA for treatment of beta-hemolytic streptococcal infections need not be performed routinely because nonsusceptible isolates are extremely rare in any beta-hemolytic streptococcus and have not been reported for Streptococcus pyogenes (group A). (CLSI)      PHQ2/9:    03/30/2024   12:57 PM 01/20/2024    2:34 PM 12/20/2023    1:23 PM 11/06/2023    3:04 PM 10/03/2023    1:22 PM  Depression screen PHQ 2/9  Decreased Interest 3 1 3 1 1   Down, Depressed, Hopeless 3 2 3 3 3   PHQ - 2 Score 6 3 6 4 4   Altered  sleeping 0 0 0 0 3  Tired, decreased energy 2 1 0 1 3  Change in appetite 0 3 2 2 3   Feeling bad or failure about yourself  3 3 2 3 3   Trouble concentrating 1 0 0 1 3  Moving slowly or fidgety/restless 0 0 0 0 0  Suicidal thoughts 0 0 0 0 1  PHQ-9 Score 12 10  10  11  20    Difficult doing work/chores Very difficult Somewhat difficult Very difficult Very difficult Extremely dIfficult     Data saved with a previous flowsheet row definition    phq 9 is positive  Fall Risk:    03/30/2024   12:57 PM 12/20/2023    1:20 PM 11/06/2023    2:54 PM 10/03/2023    1:15 PM 09/04/2023   10:10 AM  Fall Risk   Falls in the past year? 0 0 0 0 0  Number falls in past yr: 0 0 0 0 0  Injury with Fall? 0 0 0 0 0  Risk for fall due to : No Fall Risks No Fall Risks  No Fall Risks   Follow up Falls evaluation completed Falls evaluation completed Falls evaluation completed Falls evaluation completed Falls evaluation completed    Assessment & Plan Depression and Anxiety Disorder Moderate depression with PHQ-9 score of 12. Current fluoxetine  treatment insufficient. Wellbutrin  considered to enhance energy and aid weight management. Discussed Wellbutrin  side effects, including seizure risk. - Continue fluoxetine  20 mg daily. - Initiated Wellbutrin  150 mg daily. - Monitor for side effects and efficacy of Wellbutrin . - Advised to return sooner if depression worsens.  Morbid Obesity BMI 48. Significant weight loss with Zepbound , discontinued due to insurance. Discussed weight maintenance strategies and potential Wellbutrin  benefits. - Gradually taper Zepbound  by extending interval between doses. - Encouraged use of MyFitnessPal for calorie tracking. - Increase physical activity every 30 minutes. - Monitor weight and BMI.  Mild Intermittent Asthma Managed with Air Supra as needed. Recent exacerbation resolved. No current Dulera  use. Engineer, Civil (consulting) as needed. - Consider Singulair for asthma and allergy  management.  Allergic Rhinitis Symptoms triggered by pollen, dirt, and dust. Advised switching from Benadryl  to Xyzal to avoid drowsiness. - Prescribed levocetirizine (Xyzal) for allergy management. - Advised to avoid Benadryl  due  to sedative effects. - Consider Singulair for additional allergy control.  Insomnia Managed with Xyzal preferred over Benadryl  to avoid daytime drowsiness. - Continue Xyzal at night for sleep management. - Avoid Benadryl  to prevent daytime sluggishness.  Gastroesophageal Reflux Disease Symptoms improved post-breastfeeding, managed as needed. - Continue as-needed management for reflux symptoms.

## 2024-04-20 ENCOUNTER — Ambulatory Visit: Admitting: Nurse Practitioner

## 2024-05-08 ENCOUNTER — Telehealth: Admitting: Family Medicine

## 2024-05-08 DIAGNOSIS — N3000 Acute cystitis without hematuria: Secondary | ICD-10-CM | POA: Diagnosis not present

## 2024-05-08 MED ORDER — CEPHALEXIN 500 MG PO CAPS: 500.0000 mg | ORAL_CAPSULE | Freq: Two times a day (BID) | ORAL | 0 refills | Status: AC

## 2024-05-08 NOTE — Progress Notes (Signed)

## 2024-05-22 ENCOUNTER — Ambulatory Visit: Payer: Self-pay

## 2024-05-22 VITALS — BP 129/87 | Ht 63.0 in | Wt 284.3 lb

## 2024-05-22 DIAGNOSIS — Z3042 Encounter for surveillance of injectable contraceptive: Secondary | ICD-10-CM | POA: Diagnosis not present

## 2024-05-22 MED ORDER — MEDROXYPROGESTERONE ACETATE 150 MG/ML IM SUSP
150.0000 mg | Freq: Once | INTRAMUSCULAR | Status: AC
  Administered 2024-05-22: 150 mg via INTRAMUSCULAR

## 2024-05-22 NOTE — Patient Instructions (Signed)

## 2024-05-22 NOTE — Progress Notes (Signed)
" ° ° °  NURSE VISIT NOTE  Subjective:    Patient ID: Katherine Huynh, female    DOB: [birth year], 29 y.o.   MRN: [id number]  HPI  Patient is a 29 y.o. G66P1021 female who presents for depo provera  injection.   Objective:    There were no vitals taken for this visit.  Last Annual: 10/10/2023. Last pap: 10/10/2023. Last Depo-Provera : 03/06/2024. Side Effects if any: none. Serum HCG indicated? Yes . Depo-Provera  150 mg IM given by: Camelia Fetters, CMA. Site: Right [place]  Lab Review  No results found for any visits on 05/22/24.  Assessment:   1. Surveillance for Depo-Provera  contraception      Plan:   Next appointment due between March 27 and April 10.    Camelia Fetters, CMA [facility]

## 2024-05-28 ENCOUNTER — Ambulatory Visit: Payer: Self-pay | Admitting: Nurse Practitioner

## 2024-05-28 VITALS — BP 138/88 | HR 91 | Temp 98.6°F | Ht 63.0 in | Wt 284.0 lb

## 2024-05-28 DIAGNOSIS — F331 Major depressive disorder, recurrent, moderate: Secondary | ICD-10-CM

## 2024-05-28 DIAGNOSIS — R232 Flushing: Secondary | ICD-10-CM

## 2024-05-28 DIAGNOSIS — M2550 Pain in unspecified joint: Secondary | ICD-10-CM

## 2024-05-28 MED ORDER — CARIPRAZINE HCL 1.5 MG PO CAPS: 1.5000 mg | ORAL_CAPSULE | Freq: Every day | ORAL | 0 refills | Status: AC

## 2024-05-28 NOTE — Progress Notes (Signed)
 "  BP 138/88   Pulse 91   Temp 98.6 F (37 C)   Ht 5' 3 (1.6 m)   Wt 284 lb (128.8 kg)   SpO2 97%   BMI 50.31 kg/m    Subjective:    Patient ID: Katherine Huynh, female    DOB: September 08, 1995, 29 y.o.   MRN: [id number]  HPI: Katherine Huynh is a 29 y.o. female  Chief Complaint  Patient presents with   Hot Flashes    Pt c/o hot flashes x2 months.    Discussed the use of AI scribe software for clinical note transcription with the patient, who gave verbal consent to proceed.  History of Present Illness Katherine Huynh is a 29 year old female with PCOS who presents with hot flashes and dizziness.  Vasomotor symptoms (hot flashes) - Hot flashes present for over two months - Inability to regulate body temperature - Frequently wakes up feeling extremely hot - Requires cooling measures such as drinking water, standing outside, turning on the [facility], and taking cold showers - Cooling down takes a considerable amount of time - No associated fevers  Dizziness and near syncope - Dizziness accompanies hot flashes - Describes feeling 'loosey' and unable to stand up straight - Near syncope episodes triggered by heat - Symptoms present for a few weeks - No chest pain, palpitations, or shortness of breath - Recent flu, which may affect perception of shortness of breath  Menstrual irregularity and contraceptive use - Last menstrual period was four months ago - On Depo-Provera  for one and a half years - Does not expect regular periods due to Depo-Provera  - History of irregular and heavy periods prior to Depo-Provera  - History of polycystic ovary syndrome (PCOS)  Dermatologic and musculoskeletal symptoms - Frequently breaks out in rashes - Experiences joint pain, particularly in back and knees  Mood disturbance - Currently experiencing breakthrough depression - Fluoxetine  not as effective as desired - Previously prescribed Wellbutrin  but did not take it due to alcohol consumption          05/28/2024    2:49 PM 03/30/2024   12:57 PM 01/20/2024    2:34 PM  Depression screen PHQ 2/9  Decreased Interest 3 3 1   Down, Depressed, Hopeless 3 3 2   PHQ - 2 Score 6 6 3   Altered sleeping 0 0 0  Tired, decreased energy 2 2 1   Change in appetite 0 0 3  Feeling bad or failure about yourself  3 3 3   Trouble concentrating 1 1 0  Moving slowly or fidgety/restless 0 0 0  Suicidal thoughts 0 0 0  PHQ-9 Score 12 12 10    Difficult doing work/chores Very difficult Very difficult Somewhat difficult     Data saved with a previous flowsheet row definition       05/28/2024    2:50 PM 03/30/2024    1:06 PM 01/20/2024    2:32 PM 12/20/2023    1:24 PM  GAD 7 : Generalized Anxiety Score  Nervous, Anxious, on Edge 3 3 3 3   Control/stop worrying 3 3 3 3   Worry too much - different things 3 3 1 3   Trouble relaxing 3 3 1 3   Restless 0 0 0 0  Easily annoyed or irritable 1 1 1  0  Afraid - awful might happen 3 3 0 3  Total GAD 7 Score 16 16 9 15   Anxiety Difficulty Very difficult Very difficult Somewhat difficult Very difficult     Relevant past medical, surgical, family  and social history reviewed and updated as indicated. Interim medical history since our last visit reviewed. Allergies and medications reviewed and updated.  Review of Systems  Ten systems reviewed and is negative except as mentioned in HPI      Objective:      BP 138/88   Pulse 91   Temp 98.6 F (37 C)   Ht 5' 3 (1.6 m)   Wt 284 lb (128.8 kg)   SpO2 97%   BMI 50.31 kg/m    Wt Readings from Last 3 Encounters:  05/28/24 284 lb (128.8 kg)  05/22/24 284 lb 4.8 oz (129 kg)  03/30/24 271 lb (122.9 kg)    Physical Exam GENERAL: Alert, cooperative, well developed, no acute distress. HEENT: Normocephalic, normal oropharynx, moist mucous membranes. CHEST: Clear to auscultation bilaterally, no wheezes, rhonchi, or crackles. CARDIOVASCULAR: Normal heart rate and rhythm, S1 and S2 normal without murmurs. ABDOMEN: Soft,  non-tender, non-distended, without organomegaly, normal bowel sounds. EXTREMITIES: No cyanosis or edema. NEUROLOGICAL: Cranial nerves grossly intact, moves all extremities without gross motor or sensory deficit. Motor function normal, strength 5/5. Sensation symmetric and intact.  Results for orders placed or performed in visit on 02/13/24  POCT Urinalysis Dipstick   Collection Time: 02/13/24  3:27 PM  Result Value Ref Range   Color, UA     Clarity, UA     Glucose, UA Negative Negative   Bilirubin, UA +    Ketones, UA 1    Spec Grav, UA 1.015 1.010 - 1.025   Blood, UA +++    pH, UA 5.0 5.0 - 8.0   Protein, UA Positive (A) Negative   Urobilinogen, UA 0.2 0.2 or 1.0 E.U./dL   Nitrite, UA negative    Leukocytes, UA Large (3+) (A) Negative   Appearance     Odor    Urine Culture   Collection Time: 02/13/24  4:16 PM   Specimen: Urine   UR  Result Value Ref Range   Urine Culture, Routine Final report (A)    Organism ID, Bacteria Comment (A)           Assessment & Plan:   Problem List Items Addressed This Visit       Other   Moderate episode of recurrent major depressive disorder (HCC)   Relevant Medications   cariprazine  (VRAYLAR ) 1.5 MG capsule   Other Visit Diagnoses       Hot flashes    -  Primary   Relevant Orders   TSH   CBC with Differential/Platelet   Comprehensive metabolic panel with GFR   Hemoglobin A1c   FSH/LH   Sedimentation rate   C-reactive protein   Analyzer ANA IFA w/RFLX Titer/Pattern,Systemic Autoimmune Panel 1   hCG, serum, qualitative     Arthralgia, unspecified joint       Relevant Orders   TSH   CBC with Differential/Platelet   Comprehensive metabolic panel with GFR   Hemoglobin A1c   FSH/LH   Sedimentation rate   C-reactive protein   Analyzer ANA IFA w/RFLX Titer/Pattern,Systemic Autoimmune Panel 1   hCG, serum, qualitative        Assessment and Plan Assessment & Plan Hot flashes Experiencing hot flashes for over two months,  with episodes of dizziness and near syncope due to inability to regulate body temperature. No associated chest pain, palpitations, or shortness of breath. Last menstrual period was four months ago, currently on Depo-Provera  for 1.5 years. Irregular and heavy periods prior to Depo-Provera  use. No fevers  or rashes, but reports joint pain in back and knees. - Ordered blood work including sed rate, CRP, autoimmune panel, FSH, LH, CBC, and CMP.  Arthralgia Reports joint pain primarily in the back and knees. No new medications or family history of autoimmune disorders. - Ordered blood work including sed rate, CRP, autoimmune panel, FSH, LH, CBC, and CMP.  Major depressive disorder, recurrent, moderate Reports breakthrough depression and feels current antidepressant (fluoxetine ) is not as effective as desired. Previously prescribed Wellbutrin  but did not take it due to alcohol consumption. Interested in trying a mood stabilizer to address lack of motivation and improve energy levels. - Started Vraylar  1.5 mg daily as adjunct therapy. - Scheduled follow-up appointment in four weeks to assess response to Vraylar .        Follow up plan: Return in about 4 weeks (around 06/25/2024) for follow up. "

## 2024-05-29 ENCOUNTER — Ambulatory Visit: Payer: Self-pay | Admitting: Nurse Practitioner

## 2024-06-05 LAB — COMPREHENSIVE METABOLIC PANEL WITH GFR
AG Ratio: 1.2 (calc) (ref 1.0–2.5)
ALT: 13 U/L (ref 6–29)
AST: 12 U/L (ref 10–30)
Albumin: 4 g/dL (ref 3.6–5.1)
Alkaline phosphatase (APISO): 90 U/L (ref 31–125)
BUN: 10 mg/dL (ref 7–25)
CO2: 22 mmol/L (ref 20–32)
Calcium: 9.3 mg/dL (ref 8.6–10.2)
Chloride: 107 mmol/L (ref 98–110)
Creat: 0.76 mg/dL (ref 0.50–0.96)
Globulin: 3.3 g/dL (ref 1.9–3.7)
Glucose, Bld: 82 mg/dL (ref 65–99)
Potassium: 3.8 mmol/L (ref 3.5–5.3)
Sodium: 140 mmol/L (ref 135–146)
Total Bilirubin: 0.5 mg/dL (ref 0.2–1.2)
Total Protein: 7.3 g/dL (ref 6.1–8.1)
eGFR: 109 mL/min/1.73m2

## 2024-06-05 LAB — SEDIMENTATION RATE: Sed Rate: 19 mm/h (ref 0–20)

## 2024-06-05 LAB — CBC WITH DIFFERENTIAL/PLATELET
Absolute Lymphocytes: 2265 {cells}/uL (ref 850–3900)
Absolute Monocytes: 312 {cells}/uL (ref 200–950)
Basophils Absolute: 30 {cells}/uL (ref 0–200)
Basophils Relative: 0.4 %
Eosinophils Absolute: 296 {cells}/uL (ref 15–500)
Eosinophils Relative: 3.9 %
HCT: 41.7 % (ref 35.9–46.0)
Hemoglobin: 13.7 g/dL (ref 11.7–15.5)
MCH: 29 pg (ref 27.0–33.0)
MCHC: 32.9 g/dL (ref 31.6–35.4)
MCV: 88.2 fL (ref 81.4–101.7)
MPV: 11 fL (ref 7.5–12.5)
Monocytes Relative: 4.1 %
Neutro Abs: 4697 {cells}/uL (ref 1500–7800)
Neutrophils Relative %: 61.8 %
Platelets: 351 Thousand/uL (ref 140–400)
RBC: 4.73 Million/uL (ref 3.80–5.10)
RDW: 15.2 % — ABNORMAL HIGH (ref 11.0–15.0)
Total Lymphocyte: 29.8 %
WBC: 7.6 Thousand/uL (ref 3.8–10.8)

## 2024-06-05 LAB — FSH/LH
FSH: 3.7 m[IU]/mL
LH: 0.5 m[IU]/mL

## 2024-06-05 LAB — HEMOGLOBIN A1C
Hgb A1c MFr Bld: 5.3 %
Mean Plasma Glucose: 105 mg/dL
eAG (mmol/L): 5.8 mmol/L

## 2024-06-05 LAB — ANALYZER(R)ANA IFA WITH REFLEX TITER/PATTRN,SYS AUTOIMM PNL1
Anti Nuclear Antibody (ANA): NEGATIVE
Anticardiolipin IgA: <2 [APL'U]/mL
Anticardiolipin IgG: <2 [GPL'U]/mL
Anticardiolipin IgM: <2 [MPL'U]/mL
Beta-2 Glyco 1 IgA: <2 U/mL
Beta-2 Glyco 1 IgM: <2 U/mL
Beta-2 Glyco I IgG: <2 U/mL
C3 Complement: 171 mg/dL (ref 83–193)
C4 Complement: 35 mg/dL (ref 15–57)
Centromere Ab Screen: <1 AI
Chromatin (Nucleosomal) Antibody: <1 AI
Cyclic Citrullin Peptide Ab: <16 U
DNA Ab (DS) Crithidia, IFA: NEGATIVE
ENA SM Ab Ser-aCnc: <1 AI
Jo-1 Autoabs: <1 AI
MUTATED CITRULLINATED VIMENTIN (MCV) AB: <20 U/mL
Rheumatoid Factor (IgA): 5 U
Rheumatoid Factor (IgG): <5 U
Rheumatoid Factor (IgM): <5 U
Ribonucleic Protein(ENA) Antibody, IgG: <1 AI
SM/RNP: <1 AI
SSA (Ro) (ENA) Antibody, IgG: <1 AI
SSB (La) (ENA) Antibody, IgG: <1 AI
Scleroderma (Scl-70) (ENA) Antibody, IgG: <1 AI
Thyroperoxidase Ab SerPl-aCnc: 5 [IU]/mL

## 2024-06-05 LAB — TSH: TSH: 0.7 m[IU]/L

## 2024-06-05 LAB — C-REACTIVE PROTEIN: CRP: 15.4 mg/L — ABNORMAL HIGH

## 2024-06-05 LAB — HCG, SERUM, QUALITATIVE: Preg, Serum: NEGATIVE

## 2024-06-25 ENCOUNTER — Ambulatory Visit: Payer: Self-pay | Admitting: Nurse Practitioner

## 2024-07-01 ENCOUNTER — Ambulatory Visit: Admitting: Nurse Practitioner

## 2024-07-08 ENCOUNTER — Ambulatory Visit: Payer: Self-pay | Admitting: Nurse Practitioner

## 2024-08-07 ENCOUNTER — Ambulatory Visit
